# Patient Record
Sex: Female | Born: 1960 | Race: White | Hispanic: No | Marital: Single | Smoking: Former smoker
Health system: Southern US, Community
[De-identification: ages and names within clinical notes are randomized; demographics above are authoritative.]

## PROBLEM LIST (undated history)

## (undated) DIAGNOSIS — C801 Malignant (primary) neoplasm, unspecified: Secondary | ICD-10-CM

## (undated) DIAGNOSIS — F209 Schizophrenia, unspecified: Secondary | ICD-10-CM

## (undated) DIAGNOSIS — I10 Essential (primary) hypertension: Secondary | ICD-10-CM

## (undated) HISTORY — PX: HERNIA REPAIR: SHX51

## (undated) HISTORY — PX: KIDNEY SURGERY: SHX687

---

## 1997-12-08 ENCOUNTER — Encounter: Admission: RE | Admit: 1997-12-08 | Discharge: 1997-12-08 | Payer: Self-pay | Admitting: Family Medicine

## 1997-12-11 ENCOUNTER — Encounter: Admission: RE | Admit: 1997-12-11 | Discharge: 1997-12-11 | Payer: Self-pay | Admitting: Family Medicine

## 1998-01-12 ENCOUNTER — Encounter: Admission: RE | Admit: 1998-01-12 | Discharge: 1998-01-12 | Payer: Self-pay | Admitting: Family Medicine

## 1998-01-28 ENCOUNTER — Encounter: Admission: RE | Admit: 1998-01-28 | Discharge: 1998-01-28 | Payer: Self-pay | Admitting: Family Medicine

## 1998-04-16 ENCOUNTER — Encounter: Admission: RE | Admit: 1998-04-16 | Discharge: 1998-04-16 | Payer: Self-pay | Admitting: Family Medicine

## 1998-07-22 ENCOUNTER — Encounter: Admission: RE | Admit: 1998-07-22 | Discharge: 1998-07-22 | Payer: Self-pay | Admitting: Family Medicine

## 1998-08-11 ENCOUNTER — Encounter: Admission: RE | Admit: 1998-08-11 | Discharge: 1998-08-11 | Payer: Self-pay | Admitting: Family Medicine

## 1998-08-13 ENCOUNTER — Encounter: Admission: RE | Admit: 1998-08-13 | Discharge: 1998-08-13 | Payer: Self-pay | Admitting: Family Medicine

## 1998-11-05 ENCOUNTER — Encounter: Admission: RE | Admit: 1998-11-05 | Discharge: 1998-11-05 | Payer: Self-pay | Admitting: Family Medicine

## 1998-11-19 ENCOUNTER — Encounter: Admission: RE | Admit: 1998-11-19 | Discharge: 1998-11-19 | Payer: Self-pay | Admitting: Family Medicine

## 1998-12-24 ENCOUNTER — Encounter: Admission: RE | Admit: 1998-12-24 | Discharge: 1998-12-24 | Payer: Self-pay | Admitting: Family Medicine

## 1999-07-09 ENCOUNTER — Encounter: Admission: RE | Admit: 1999-07-09 | Discharge: 1999-07-09 | Payer: Self-pay | Admitting: Family Medicine

## 2001-04-25 ENCOUNTER — Encounter: Admission: RE | Admit: 2001-04-25 | Discharge: 2001-04-25 | Payer: Self-pay | Admitting: Family Medicine

## 2002-02-11 ENCOUNTER — Encounter: Admission: RE | Admit: 2002-02-11 | Discharge: 2002-02-11 | Payer: Self-pay | Admitting: Family Medicine

## 2002-02-13 ENCOUNTER — Encounter: Admission: RE | Admit: 2002-02-13 | Discharge: 2002-02-13 | Payer: Self-pay | Admitting: Family Medicine

## 2002-02-18 ENCOUNTER — Encounter: Admission: RE | Admit: 2002-02-18 | Discharge: 2002-02-18 | Payer: Self-pay | Admitting: Family Medicine

## 2003-10-21 ENCOUNTER — Ambulatory Visit (HOSPITAL_COMMUNITY): Admission: RE | Admit: 2003-10-21 | Discharge: 2003-10-21 | Payer: Self-pay | Admitting: Family Medicine

## 2003-10-21 ENCOUNTER — Encounter: Admission: RE | Admit: 2003-10-21 | Discharge: 2003-10-21 | Payer: Self-pay | Admitting: Family Medicine

## 2004-10-22 ENCOUNTER — Emergency Department (HOSPITAL_COMMUNITY): Admission: EM | Admit: 2004-10-22 | Discharge: 2004-10-22 | Payer: Self-pay | Admitting: Emergency Medicine

## 2004-10-25 ENCOUNTER — Ambulatory Visit: Payer: Self-pay | Admitting: Family Medicine

## 2005-01-21 ENCOUNTER — Ambulatory Visit: Payer: Self-pay | Admitting: Family Medicine

## 2005-03-01 ENCOUNTER — Other Ambulatory Visit: Admission: RE | Admit: 2005-03-01 | Discharge: 2005-03-01 | Payer: Self-pay | Admitting: Family Medicine

## 2005-03-04 ENCOUNTER — Encounter: Admission: RE | Admit: 2005-03-04 | Discharge: 2005-03-04 | Payer: Self-pay | Admitting: Family Medicine

## 2006-08-31 DIAGNOSIS — F17200 Nicotine dependence, unspecified, uncomplicated: Secondary | ICD-10-CM

## 2006-08-31 DIAGNOSIS — N951 Menopausal and female climacteric states: Secondary | ICD-10-CM

## 2006-08-31 DIAGNOSIS — I10 Essential (primary) hypertension: Secondary | ICD-10-CM | POA: Insufficient documentation

## 2006-08-31 DIAGNOSIS — E669 Obesity, unspecified: Secondary | ICD-10-CM

## 2006-08-31 DIAGNOSIS — F209 Schizophrenia, unspecified: Secondary | ICD-10-CM | POA: Insufficient documentation

## 2006-08-31 DIAGNOSIS — L732 Hidradenitis suppurativa: Secondary | ICD-10-CM

## 2010-07-25 ENCOUNTER — Encounter: Payer: Self-pay | Admitting: Family Medicine

## 2014-12-24 ENCOUNTER — Other Ambulatory Visit: Payer: Self-pay | Admitting: Family Medicine

## 2014-12-24 DIAGNOSIS — Z1231 Encounter for screening mammogram for malignant neoplasm of breast: Secondary | ICD-10-CM

## 2014-12-31 ENCOUNTER — Ambulatory Visit: Payer: Self-pay

## 2015-07-28 ENCOUNTER — Ambulatory Visit: Payer: Self-pay

## 2015-08-19 ENCOUNTER — Ambulatory Visit: Payer: Self-pay

## 2015-09-07 ENCOUNTER — Ambulatory Visit: Payer: Self-pay

## 2015-10-13 ENCOUNTER — Ambulatory Visit: Payer: Self-pay

## 2015-11-17 ENCOUNTER — Ambulatory Visit
Admission: RE | Admit: 2015-11-17 | Discharge: 2015-11-17 | Disposition: A | Discharge status: Home / Self Care | Payer: Medicare Other | Source: Ambulatory Visit | Attending: Family Medicine | Admitting: Family Medicine

## 2015-11-17 DIAGNOSIS — Z1231 Encounter for screening mammogram for malignant neoplasm of breast: Secondary | ICD-10-CM

## 2015-11-19 ENCOUNTER — Other Ambulatory Visit: Payer: Self-pay | Admitting: Family Medicine

## 2015-11-19 DIAGNOSIS — R928 Other abnormal and inconclusive findings on diagnostic imaging of breast: Secondary | ICD-10-CM

## 2015-12-10 ENCOUNTER — Ambulatory Visit
Admission: RE | Admit: 2015-12-10 | Discharge: 2015-12-10 | Disposition: A | Discharge status: Home / Self Care | Payer: Medicare Other | Source: Ambulatory Visit | Attending: Family Medicine | Admitting: Family Medicine

## 2015-12-10 DIAGNOSIS — R928 Other abnormal and inconclusive findings on diagnostic imaging of breast: Secondary | ICD-10-CM

## 2015-12-23 ENCOUNTER — Other Ambulatory Visit: Payer: Self-pay | Admitting: Family Medicine

## 2015-12-23 DIAGNOSIS — Z136 Encounter for screening for cardiovascular disorders: Secondary | ICD-10-CM

## 2015-12-29 ENCOUNTER — Ambulatory Visit: Payer: Medicare Other

## 2017-10-09 ENCOUNTER — Other Ambulatory Visit: Payer: Self-pay | Admitting: Family Medicine

## 2017-10-09 DIAGNOSIS — F17200 Nicotine dependence, unspecified, uncomplicated: Secondary | ICD-10-CM

## 2017-10-17 ENCOUNTER — Ambulatory Visit: Payer: Medicare Other

## 2017-10-24 ENCOUNTER — Ambulatory Visit
Admission: RE | Admit: 2017-10-24 | Discharge: 2017-10-24 | Disposition: A | Discharge status: Home / Self Care | Payer: Medicare Other | Source: Ambulatory Visit | Attending: Family Medicine | Admitting: Family Medicine

## 2017-10-24 ENCOUNTER — Other Ambulatory Visit: Payer: Self-pay | Admitting: Family Medicine

## 2017-10-24 DIAGNOSIS — R059 Cough, unspecified: Secondary | ICD-10-CM

## 2017-10-24 DIAGNOSIS — R05 Cough: Secondary | ICD-10-CM

## 2017-10-24 DIAGNOSIS — F17200 Nicotine dependence, unspecified, uncomplicated: Secondary | ICD-10-CM

## 2017-10-31 ENCOUNTER — Institutional Professional Consult (permissible substitution) (INDEPENDENT_AMBULATORY_CARE_PROVIDER_SITE_OTHER): Payer: Medicare Other | Admitting: Thoracic Surgery (Cardiothoracic Vascular Surgery)

## 2017-10-31 ENCOUNTER — Other Ambulatory Visit: Payer: Self-pay

## 2017-10-31 ENCOUNTER — Encounter: Payer: Self-pay | Admitting: Thoracic Surgery (Cardiothoracic Vascular Surgery)

## 2017-10-31 ENCOUNTER — Encounter: Payer: Self-pay | Admitting: *Deleted

## 2017-10-31 ENCOUNTER — Other Ambulatory Visit: Payer: Self-pay | Admitting: *Deleted

## 2017-10-31 VITALS — BP 100/69 | HR 110 | Resp 18 | Ht 61.0 in | Wt 180.0 lb

## 2017-10-31 DIAGNOSIS — R918 Other nonspecific abnormal finding of lung field: Secondary | ICD-10-CM | POA: Diagnosis not present

## 2017-10-31 DIAGNOSIS — R911 Solitary pulmonary nodule: Secondary | ICD-10-CM

## 2017-10-31 NOTE — Progress Notes (Signed)
PCP is [facility], User, MD (Inactive) Referring Provider is Nickola Major, MD  Chief Complaint  Patient presents with  . Lung Mass     INFRAHILAR per CANCER SCREENING CT CHEST 10/24/17...new onset of right sided headache x 11/2 months  . Adenopathy    HPI: Jill [name] is sent for consultation regarding a right lung mass.  Jill [name] is a 57 year old woman with a history of schizophrenia, obesity, hypertension, hyperlipidemia, hidradenitis, and tobacco abuse.  She has smoked about 2 packs of cigarettes daily since age 30 (72-pack-year).  She has been feeling poorly since September.  She first started having a lot of congestion and frequent coughing back around Labor Day.  She was treated several times for bronchitis with antibiotics.  Each time she would see some improvement but after completing her course her symptoms will return relatively quickly.  This is become progressively worse to the point where she developed a chronic persistent cough.  She had a small amount of hemoptysis on one occasion.  She also was feeling some shortness of breath.  Along with this she was having fevers and intermittent chills and intermittent night sweats.  She saw Dr.Eksir in [place].  She did a chest x-ray which suggested a right lower lobe pneumonia.  I was followed up with a CT of the chest which showed a 3.8 x 5.4 cm mass in the right lower lobe.  There was a second 1.7 cm nodule in the right middle lobe.  There also was evidence of centrilobular emphysema.  She complains of headaches over the past month and a half.  These are typically right-sided.  She continues to have a cough.  She had one episode of hemoptysis.  She does complain of wheezing.  She has not been having any chest pressure, pain or tightness.  But she does experience shortness of breath with exertion.  She complains of her voice being hoarse for several months.  PAST MEDICAL  HISTORY Schizophrenia Hypertension Hyperlipidemia Obesity Pneumonia Allergies  PAST SURGICAL HISTORY Eye surgery Hiatal hernia repair  FAMILY HISTORY Mother died of cancer 20 years old Brother has COPD  Social History Social History   Tobacco Use  . Smoking status: Current Every Day Smoker    Packs/day: 2.00    Years: 36.00    Pack years: 72.00    Types: Cigarettes    Start date: 10/31/1981  . Smokeless tobacco: Never Used  . Tobacco comment: HAS CALLED QUIT 4 FREE ALREADY  Substance Use Topics  . Alcohol use: Never    Frequency: Never  . Drug use: Never    Current Outpatient Medications  Medication Sig Dispense Refill  . aspirin EC 81 MG tablet Take 81 mg by mouth daily.    Marland Kitchen aspirin-acetaminophen-caffeine (EXCEDRIN MIGRAINE) 250-250-65 MG tablet Take 2 tablets by mouth every 6 (six) hours as needed for headache.    . haloperidol decanoate (HALDOL DECANOATE) 100 MG/ML injection Inject into the muscle every 28 (twenty-eight) days. [place] by Jill [name] [phone]    . HYDROcodone-acetaminophen (HYCET) 7.5-325 mg/15 ml solution Take 5 mLs by mouth at bedtime.    Marland Kitchen lisinopril-hydrochlorothiazide (PRINZIDE,ZESTORETIC) 20-25 MG tablet Take 1 tablet by mouth daily.    . rosuvastatin (CRESTOR) 20 MG tablet Take 20 mg by mouth daily at 6 PM.     No current facility-administered medications for this visit.     No Known Allergies  Review of Systems  Constitutional: Positive for appetite change, diaphoresis, fatigue, fever  and unexpected weight change (Lost 7 pounds in 3 months).  HENT: Positive for sinus pressure and voice change (Hoarse for several months).   Eyes: Negative for visual disturbance.  Respiratory: Positive for cough, shortness of breath and wheezing.   Gastrointestinal: Negative for abdominal distention and abdominal pain.       Hiatal hernia surgery  Genitourinary: Negative for difficulty urinating and dyspareunia.   Musculoskeletal: Negative for arthralgias and myalgias.  Neurological: Positive for headaches (Right-sided). Negative for seizures and syncope.  Hematological: Negative for adenopathy. Does not bruise/bleed easily.  Psychiatric/Behavioral:       Schizophrenia-Haldol injection monthly  All other systems reviewed and are negative.   BP 100/69 (BP Location: Right Arm, Patient Position: Sitting, Cuff Size: Large)   Pulse (!) 110   Resp 18   Ht 5\' 1"  (1.549 m)   Wt 180 lb (81.6 kg)   SpO2 94% Comment: ON RA  BMI 34.01 kg/m  Physical Exam  Constitutional: She is oriented to person, place, and time.  Obese  HENT:  Head: Normocephalic and atraumatic.  Mouth/Throat: No oropharyngeal exudate.  Eyes: Conjunctivae and EOM are normal. No scleral icterus.  Neck: No thyromegaly present.  Cardiovascular: Normal rate, regular rhythm and normal heart sounds. Exam reveals no gallop and no friction rub.  No murmur heard. Pulmonary/Chest: Effort normal and breath sounds normal.  Abdominal: Soft. She exhibits no distension. There is no tenderness.  Musculoskeletal: She exhibits no edema.  Lymphadenopathy:    She has no cervical adenopathy.  Neurological: She is alert and oriented to person, place, and time. No cranial nerve deficit.  No focal motor deficit  Skin: Skin is warm and dry.  No clubbing cyanosis or edema  Psychiatric: She has a normal mood and affect.  Vitals reviewed.  Diagnostic Tests: CT CHEST WITHOUT CONTRAST LOW-DOSE FOR LUNG CANCER SCREENING  TECHNIQUE: Multidetector CT imaging of the chest was performed following the standard protocol without IV contrast.  COMPARISON:  None.  FINDINGS: Cardiovascular: The heart size is normal. No pericardial effusion. Coronary artery calcification is evident. Atherosclerotic calcification is noted in the wall of the thoracic aorta. Ascending thoracic aorta  Mediastinum/Nodes: Low right paratracheal lymph node measures 12  mm short axis. 12 mm short axis subcarinal lymph node evident. 6 mm short axis lymph node identified in the right thoracic inlet (image 10/series 2). There is no axillary lymphadenopathy. The esophagus has normal imaging features.  Lungs/Pleura: 3.8 x 5.4 cm lesion identified in the right infrahilar region, generating substantial mass-effect on the right middle lobe bronchus and obliterating central airway to the right lower lobe.  1.1 x 1.7 cm central right middle lobe pulmonary nodule is identified on image 157 of series 3. There is confluent airspace opacity in the posterior right lung which may be neoplastic or postobstructive in etiology.  Centrilobular emphysema evident. Atelectasis/scarring noted in the right middle lobe and lingula. Calcified granuloma identified in the left lower lobe near the major fissure.  Upper Abdomen: 12 mm left adrenal nodule has an average attenuation of 1 Hounsfield units suggesting adenoma. Right adrenal gland unremarkable. Nonobstructing renal stones are evident bilaterally.  Musculoskeletal: Bone windows reveal no worrisome lytic or sclerotic osseous lesions.  IMPRESSION: 1. 5.4 cm right infrahilar mass obliterates right lower lobe bronchus and attenuates the right middle lobe airway. This is associated with mediastinal lymphadenopathy and a 1.7 cm nodule in the central right middle lobe concerning for metastatic involvement. Confluent airspace opacity in the posterior right lower lobe may  be neoplastic and/or postobstructive. Lung-RADS 4B, highly suspicious for primary neoplasm with metastatic disease. Additional imaging evaluation or consultation with Pulmonology or Thoracic Surgery recommended.  These results will be called to the ordering clinician or representative by the Radiologist Assistant, and communication documented in the PACS or zVision Dashboard.   Electronically Signed   By: Misty Stanley M.D.   On: 10/24/2017  16:48  Impression: Jill [name] is a 57 year old woman with a history of schizophrenia and tobacco abuse.  She has been having problems with cough and congestion dating back to September 2018.  More recently her symptoms worsened and she was evaluated.  Chest x-ray showed possible pneumonia but on CT it appears more consistent with a 5.4 cm right lower lobe mass.  There is not any significant hilar or mediastinal adenopathy by CT.  I reviewed the CT images with Jill [name] and her brother.  We discussed the differential diagnosis.  This most likely is a primary bronchogenic carcinoma.  He would be a T4, N0, stage IIIA lesion based on CT.  Granulomatous disease is also in the differential.  She needs a PET/CT and MRI of the brain to complete her diagnostic work-up.  She needs to go ahead and have the MRI because she is having headaches.  PET/CT will help [place] determine how to approach her for biopsy.  I suspect bronchoscopy and endobronchial ultrasound will be the best option but we need to make sure there is no evidence of distant disease that would be amenable to a needle biopsy.  Tobacco abuse-still smoking about 4 cigarettes a day.  I emphasized the importance of complete abstinence.  She does have patches.  She is going to try lozenges as well.  Schizophrenia-appears well controlled on monthly Haldol injections.  COPD-findings consistent with centrilobular emphysema on CT chest.  Had a prescription for albuterol inhaler, but discontinued as it aggravated her cough.  Plan: PET/CT MR brain Return in 2 weeks after scans to discuss potential biopsy  Melrose Nakayama, MD [facility] [phone]

## 2017-10-31 NOTE — Progress Notes (Unsigned)
pet

## 2017-11-01 ENCOUNTER — Encounter: Payer: Medicare Other | Admitting: Thoracic Surgery (Cardiothoracic Vascular Surgery)

## 2017-11-08 ENCOUNTER — Ambulatory Visit (HOSPITAL_COMMUNITY)
Admission: RE | Admit: 2017-11-08 | Discharge: 2017-11-08 | Disposition: A | Discharge status: Home / Self Care | Payer: Medicare Other | Source: Ambulatory Visit | Attending: Thoracic Surgery (Cardiothoracic Vascular Surgery) | Admitting: Thoracic Surgery (Cardiothoracic Vascular Surgery)

## 2017-11-08 ENCOUNTER — Encounter (HOSPITAL_COMMUNITY)
Admission: RE | Admit: 2017-11-08 | Discharge: 2017-11-08 | Disposition: A | Discharge status: Home / Self Care | Payer: Medicare Other | Source: Ambulatory Visit | Attending: Thoracic Surgery (Cardiothoracic Vascular Surgery) | Admitting: Thoracic Surgery (Cardiothoracic Vascular Surgery)

## 2017-11-08 DIAGNOSIS — I700 Atherosclerosis of aorta: Secondary | ICD-10-CM | POA: Insufficient documentation

## 2017-11-08 DIAGNOSIS — J189 Pneumonia, unspecified organism: Secondary | ICD-10-CM | POA: Diagnosis not present

## 2017-11-08 DIAGNOSIS — R911 Solitary pulmonary nodule: Secondary | ICD-10-CM

## 2017-11-08 DIAGNOSIS — C7931 Secondary malignant neoplasm of brain: Secondary | ICD-10-CM | POA: Insufficient documentation

## 2017-11-08 DIAGNOSIS — N200 Calculus of kidney: Secondary | ICD-10-CM | POA: Insufficient documentation

## 2017-11-08 LAB — POCT I-STAT CREATININE: CREATININE: 0.8 mg/dL (ref 0.44–1.00)

## 2017-11-08 LAB — GLUCOSE, CAPILLARY: Glucose-Capillary: 97 mg/dL (ref 65–99)

## 2017-11-08 MED ORDER — FLUDEOXYGLUCOSE F - 18 (FDG) INJECTION
9.0000 | Freq: Once | INTRAVENOUS | Status: AC | PRN
  Administered 2017-11-08: 9 via INTRAVENOUS

## 2017-11-08 MED ORDER — GADOBENATE DIMEGLUMINE 529 MG/ML IV SOLN
20.0000 mL | Freq: Once | INTRAVENOUS | Status: AC | PRN
  Administered 2017-11-08: 17 mL via INTRAVENOUS

## 2017-11-09 ENCOUNTER — Telehealth: Payer: Self-pay | Admitting: *Deleted

## 2017-11-09 ENCOUNTER — Encounter: Payer: Self-pay | Admitting: *Deleted

## 2017-11-09 DIAGNOSIS — R918 Other nonspecific abnormal finding of lung field: Secondary | ICD-10-CM

## 2017-11-09 NOTE — Progress Notes (Signed)
Oncology Nurse Navigator Documentation  Oncology Nurse Navigator Flowsheets 11/09/2017  Navigator Location [facility]  Referral date to RadOnc/MedOnc 11/09/2017  Navigator Encounter Type Other/I received a call from Dr. Leonarda Salon office.  Patient needs urgent referral to Rad Onc. Referral completed in Epic.  I updated Dr. Julien Nordmann on Jill [name].  He states she needs bronchoscopy for tissue DX then will see her.  I will update JoLene at [facility].   Patient Visit Type Other  Treatment Phase Abnormal Scans  Barriers/Navigation Needs Coordination of Care  Interventions Coordination of Care  Coordination of Care Other  Acuity Level 2  Time Spent with Patient 30

## 2017-11-09 NOTE — Telephone Encounter (Signed)
Jill [name] called today wanting the results of the MRI BRAIN and PET that she had done on 11/08/17 as ordered by Dr. Roxan Hockey for evaluation of a lung mass. She said she is very anxious and would like to have the results today even thougt her follow up  appointment isn't until next week. I reviewed the results with her which showed a lesion in her brain and bone lesions. She said these would account for her headaches and body pains she has been experiencing. I said I would call the cancer center, specifically, [facility], navigator and start the process for her consults/treatments. She was most appreciative. This was done and [name] said she would consult with Dr. Inda Merlin to see if the lung mass was amenable for a needle biopsy. I thanked her for the assistance. Dr. Roxan Hockey was also informed via staff message as he is on vac today.

## 2017-11-09 NOTE — Telephone Encounter (Signed)
[name] called to relate that Dr. Inda Merlin requests that a [facility] be performed on Jill [name], 1961-05-06, for tissue diagnosis. Dr. Roxan Hockey will be notified.

## 2017-11-10 ENCOUNTER — Encounter: Payer: Self-pay | Admitting: Radiation Oncology

## 2017-11-12 ENCOUNTER — Inpatient Hospital Stay (HOSPITAL_COMMUNITY)
Admission: EM | Admit: 2017-11-12 | Discharge: 2017-11-18 | DRG: 853 | Disposition: A | Discharge status: Home Health | Payer: Medicare Other | Attending: Internal Medicine | Admitting: Internal Medicine

## 2017-11-12 ENCOUNTER — Other Ambulatory Visit: Payer: Self-pay

## 2017-11-12 ENCOUNTER — Emergency Department (HOSPITAL_COMMUNITY): Payer: Medicare Other

## 2017-11-12 ENCOUNTER — Encounter (HOSPITAL_COMMUNITY): Payer: Self-pay | Admitting: Emergency Medicine

## 2017-11-12 DIAGNOSIS — Z6833 Body mass index (BMI) 33.0-33.9, adult: Secondary | ICD-10-CM

## 2017-11-12 DIAGNOSIS — G893 Neoplasm related pain (acute) (chronic): Secondary | ICD-10-CM | POA: Diagnosis present

## 2017-11-12 DIAGNOSIS — C3431 Malignant neoplasm of lower lobe, right bronchus or lung: Secondary | ICD-10-CM | POA: Diagnosis present

## 2017-11-12 DIAGNOSIS — R0602 Shortness of breath: Secondary | ICD-10-CM

## 2017-11-12 DIAGNOSIS — F17200 Nicotine dependence, unspecified, uncomplicated: Secondary | ICD-10-CM | POA: Diagnosis present

## 2017-11-12 DIAGNOSIS — Z79891 Long term (current) use of opiate analgesic: Secondary | ICD-10-CM

## 2017-11-12 DIAGNOSIS — J189 Pneumonia, unspecified organism: Secondary | ICD-10-CM

## 2017-11-12 DIAGNOSIS — F209 Schizophrenia, unspecified: Secondary | ICD-10-CM | POA: Diagnosis present

## 2017-11-12 DIAGNOSIS — C7951 Secondary malignant neoplasm of bone: Secondary | ICD-10-CM | POA: Diagnosis present

## 2017-11-12 DIAGNOSIS — A419 Sepsis, unspecified organism: Secondary | ICD-10-CM | POA: Diagnosis not present

## 2017-11-12 DIAGNOSIS — R5381 Other malaise: Secondary | ICD-10-CM | POA: Diagnosis present

## 2017-11-12 DIAGNOSIS — C7931 Secondary malignant neoplasm of brain: Secondary | ICD-10-CM

## 2017-11-12 DIAGNOSIS — F17210 Nicotine dependence, cigarettes, uncomplicated: Secondary | ICD-10-CM | POA: Diagnosis present

## 2017-11-12 DIAGNOSIS — C3491 Malignant neoplasm of unspecified part of right bronchus or lung: Secondary | ICD-10-CM | POA: Diagnosis present

## 2017-11-12 DIAGNOSIS — G43709 Chronic migraine without aura, not intractable, without status migrainosus: Secondary | ICD-10-CM | POA: Diagnosis present

## 2017-11-12 DIAGNOSIS — I10 Essential (primary) hypertension: Secondary | ICD-10-CM | POA: Diagnosis present

## 2017-11-12 DIAGNOSIS — R042 Hemoptysis: Secondary | ICD-10-CM | POA: Diagnosis present

## 2017-11-12 DIAGNOSIS — E669 Obesity, unspecified: Secondary | ICD-10-CM | POA: Diagnosis present

## 2017-11-12 DIAGNOSIS — Z8701 Personal history of pneumonia (recurrent): Secondary | ICD-10-CM

## 2017-11-12 DIAGNOSIS — J181 Lobar pneumonia, unspecified organism: Secondary | ICD-10-CM | POA: Diagnosis present

## 2017-11-12 DIAGNOSIS — Z79899 Other long term (current) drug therapy: Secondary | ICD-10-CM

## 2017-11-12 DIAGNOSIS — G936 Cerebral edema: Secondary | ICD-10-CM | POA: Diagnosis present

## 2017-11-12 DIAGNOSIS — R0902 Hypoxemia: Secondary | ICD-10-CM | POA: Diagnosis present

## 2017-11-12 DIAGNOSIS — Y95 Nosocomial condition: Secondary | ICD-10-CM | POA: Diagnosis present

## 2017-11-12 HISTORY — DX: Schizophrenia, unspecified: F20.9

## 2017-11-12 HISTORY — DX: Malignant (primary) neoplasm, unspecified: C80.1

## 2017-11-12 HISTORY — DX: Essential (primary) hypertension: I10

## 2017-11-12 LAB — COMPREHENSIVE METABOLIC PANEL
ALK PHOS: 74 U/L (ref 38–126)
ALT: 18 U/L (ref 14–54)
AST: 20 U/L (ref 15–41)
Albumin: 4.2 g/dL (ref 3.5–5.0)
Anion gap: 13 (ref 5–15)
BILIRUBIN TOTAL: 0.8 mg/dL (ref 0.3–1.2)
BUN: 21 mg/dL — AB (ref 6–20)
CO2: 22 mmol/L (ref 22–32)
CREATININE: 0.74 mg/dL (ref 0.44–1.00)
Calcium: 10.1 mg/dL (ref 8.9–10.3)
Chloride: 104 mmol/L (ref 101–111)
GFR calc Af Amer: >60 mL/min (ref >60)
GFR calc non Af Amer: >60 mL/min (ref >60)
Glucose, Bld: 97 mg/dL (ref 65–99)
Potassium: 4 mmol/L (ref 3.5–5.1)
Sodium: 139 mmol/L (ref 135–145)
TOTAL PROTEIN: 7.4 g/dL (ref 6.5–8.1)

## 2017-11-12 LAB — CBC
HCT: 43.1 % (ref 36.0–46.0)
Hemoglobin: 13.9 g/dL (ref 12.0–15.0)
MCH: 28.5 pg (ref 26.0–34.0)
MCHC: 32.3 g/dL (ref 30.0–36.0)
MCV: 88.3 fL (ref 78.0–100.0)
PLATELETS: 240 10*3/uL (ref 150–400)
RBC: 4.88 MIL/uL (ref 3.87–5.11)
RDW: 14.4 % (ref 11.5–15.5)
WBC: 12.4 10*3/uL — AB (ref 4.0–10.5)

## 2017-11-12 LAB — TYPE AND SCREEN
ABO/RH(D): A NEG
ANTIBODY SCREEN: NEGATIVE

## 2017-11-12 LAB — LIPASE, BLOOD: LIPASE: 45 U/L (ref 11–51)

## 2017-11-12 LAB — I-STAT TROPONIN, ED: Troponin i, poc: 0 ng/mL (ref 0.00–0.08)

## 2017-11-12 LAB — PROTIME-INR
INR: 0.98
Prothrombin Time: 12.9 seconds (ref 11.4–15.2)

## 2017-11-12 LAB — I-STAT CG4 LACTIC ACID, ED: Lactic Acid, Venous: 0.93 mmol/L (ref 0.5–1.9)

## 2017-11-12 MED ORDER — SODIUM CHLORIDE 0.9 % IV SOLN
1.0000 g | Freq: Once | INTRAVENOUS | Status: AC
  Administered 2017-11-13: 1 g via INTRAVENOUS
  Filled 2017-11-12: qty 1

## 2017-11-12 MED ORDER — IOPAMIDOL (ISOVUE-370) INJECTION 76%
INTRAVENOUS | Status: AC
  Filled 2017-11-12: qty 100

## 2017-11-12 MED ORDER — PROCHLORPERAZINE EDISYLATE 10 MG/2ML IJ SOLN
10.0000 mg | Freq: Once | INTRAMUSCULAR | Status: AC
  Administered 2017-11-12: 10 mg via INTRAVENOUS
  Filled 2017-11-12: qty 2

## 2017-11-12 MED ORDER — IOPAMIDOL (ISOVUE-370) INJECTION 76%
100.0000 mL | Freq: Once | INTRAVENOUS | Status: AC | PRN
  Administered 2017-11-12: 100 mL via INTRAVENOUS

## 2017-11-12 MED ORDER — MORPHINE SULFATE (PF) 4 MG/ML IV SOLN
4.0000 mg | Freq: Once | INTRAVENOUS | Status: AC
  Administered 2017-11-12: 4 mg via INTRAVENOUS
  Filled 2017-11-12: qty 1

## 2017-11-12 MED ORDER — NICOTINE 14 MG/24HR TD PT24
14.0000 mg | MEDICATED_PATCH | Freq: Once | TRANSDERMAL | Status: AC
  Administered 2017-11-12: 14 mg via TRANSDERMAL
  Filled 2017-11-12: qty 1

## 2017-11-12 NOTE — ED Notes (Signed)
Bed: BJ47 Expected date:  Expected time:  Means of arrival:  Comments: Triage 3

## 2017-11-12 NOTE — ED Provider Notes (Signed)
[place] DEPT Provider Note   CSN: [id number] Arrival date & time: 11/12/17  2000     History   Chief Complaint Chief Complaint  Patient presents with  . Hemoptysis    HPI Jill [name] is a 57 y.o. female.  The history is provided by the patient, medical records and a relative.  Cough  This is a new problem. The current episode started 6 to 12 hours ago. The problem occurs constantly. The problem has not changed since onset.The cough is productive of bloody sputum. There has been no fever. Associated symptoms include chest pain, headaches and shortness of breath. Pertinent negatives include no chills and no wheezing. She has tried nothing for the symptoms. The treatment provided no relief. She is a smoker. Her past medical history is significant for pneumonia (recent). Past medical history comments: lung CA.    Past Medical History:  Diagnosis Date  . Cancer ([place])   . Hypertension   . Schizophrenia [facility])     Patient Active Problem List   Diagnosis Date Noted  . OBESITY, NOS 08/31/2006  . SCHIZOPHRENIA 08/31/2006  . TOBACCO DEPENDENCE 08/31/2006  . HYPERTENSION, BENIGN SYSTEMIC 08/31/2006  . MENOPAUSAL SYNDROME 08/31/2006  . HYDRADENITIS 08/31/2006    Past Surgical History:  Procedure Laterality Date  . HERNIA REPAIR    . KIDNEY SURGERY       OB History   None      Home Medications    Prior to Admission medications   Medication Sig Start Date End Date Taking? Authorizing Provider  aspirin EC 81 MG tablet Take 81 mg by mouth daily.    [provider]  aspirin-acetaminophen-caffeine (EXCEDRIN MIGRAINE) [phone] MG tablet Take 2 tablets by mouth every 6 (six) hours as needed for headache.    [provider]  haloperidol decanoate (HALDOL DECANOATE) 100 MG/ML injection Inject into the muscle every 28 (twenty-eight) days. PER [place] by Filomena Jungling [phone]    [provider]    HYDROcodone-acetaminophen (HYCET) 7.5-325 mg/15 ml solution Take 5 mLs by mouth at bedtime.    [provider]  lisinopril-hydrochlorothiazide (PRINZIDE,ZESTORETIC) 20-25 MG tablet Take 1 tablet by mouth daily.    [provider]  rosuvastatin (CRESTOR) 20 MG tablet Take 20 mg by mouth daily at 6 PM.    [provider]    Family History History reviewed. No pertinent family history.  Social History Social History   Tobacco Use  . Smoking status: Current Every Day Smoker    Packs/day: 2.00    Years: 36.00    Pack years: 72.00    Types: Cigarettes    Start date: 10/31/1981  . Smokeless tobacco: Never Used  . Tobacco comment: HAS CALLED QUIT 4 FREE ALREADY  Substance Use Topics  . Alcohol use: Never    Frequency: Never  . Drug use: Never     Allergies   Patient has no known allergies.   Review of Systems Review of Systems  Constitutional: Positive for fatigue. Negative for chills, diaphoresis and fever.  HENT: Negative for congestion.   Eyes: Negative for visual disturbance.  Respiratory: Positive for cough, chest tightness and shortness of breath. Negative for wheezing.   Cardiovascular: Positive for chest pain. Negative for palpitations and leg swelling.  Gastrointestinal: Positive for nausea. Negative for abdominal pain, diarrhea and vomiting.  Genitourinary: Negative for flank pain.  Musculoskeletal: Negative for back pain and neck pain.  Skin: Negative for rash and wound.  Neurological: Positive for headaches. Negative for light-headedness.  Psychiatric/Behavioral: Negative for agitation.  All other systems reviewed and are negative.    Physical Exam Updated Vital Signs BP 132/83 (BP Location: Left Arm)   Pulse (!) 119   Temp 98.2 F (36.8 C) (Oral)   Resp 18   Ht 5\' 1"  (1.549 m)   Wt 81.6 kg (180 lb)   SpO2 90%   BMI 34.01 kg/m   Physical Exam  Constitutional: She is oriented to person, place, and time. She appears  well-developed and well-nourished. No distress.  HENT:  Head: Normocephalic and atraumatic.  Mouth/Throat: Oropharynx is clear and moist. No oropharyngeal exudate.  Eyes: Pupils are equal, round, and reactive to light. Conjunctivae and EOM are normal.  Neck: Normal range of motion. Neck supple.  Cardiovascular: Intact distal pulses. Tachycardia present.  No murmur heard. Pulmonary/Chest: No stridor. Tachypnea noted. No respiratory distress. She has no wheezes. She has rhonchi. She exhibits no tenderness.  Abdominal: Soft. She exhibits no distension. There is no tenderness.  Musculoskeletal: Normal range of motion. She exhibits no edema or tenderness.  Lymphadenopathy:    She has no cervical adenopathy.  Neurological: She is alert and oriented to person, place, and time. No sensory deficit. She exhibits normal muscle tone.  Skin: Capillary refill takes less than 2 seconds. No rash noted. She is not diaphoretic. No erythema.  Psychiatric: She has a normal mood and affect.  Nursing note and vitals reviewed.    ED Treatments / Results  Labs (all labs ordered are listed, but only abnormal results are displayed) Labs Reviewed  COMPREHENSIVE METABOLIC PANEL - Abnormal; Notable for the following components:      Result Value   BUN 21 (*)    All other components within normal limits  CBC - Abnormal; Notable for the following components:   WBC 12.4 (*)    All other components within normal limits  CULTURE, BLOOD (ROUTINE X 2)  CULTURE, BLOOD (ROUTINE X 2)  PROTIME-INR  LIPASE, BLOOD  I-STAT TROPONIN, ED  I-STAT CG4 LACTIC ACID, ED  I-STAT CG4 LACTIC ACID, ED  TYPE AND SCREEN  ABO/RH    EKG EKG Interpretation  Date/Time:  Monday Nov 13 2017 00:03:17 EDT Ventricular Rate:  105 PR Interval:    QRS Duration: 75 QT Interval:  318 QTC Calculation: 421 R Axis:   83 Text Interpretation:  Sinus tachycardia Atrial premature complex Low voltage, extremity and precordial leads When  compared to prior, no significant changes seen.  NO STEMI Confirmed by Antony Blackbird [phone]) on 11/13/2017 12:06:43 AM   Radiology Ct Angio Chest Pe W And/or Wo Contrast  Result Date: 11/12/2017 CLINICAL DATA:  Hemoptysis lung tumor EXAM: CT ANGIOGRAPHY CHEST WITH CONTRAST TECHNIQUE: Multidetector CT imaging of the chest was performed using the standard protocol during bolus administration of intravenous contrast. Multiplanar CT image reconstructions and MIPs were obtained to evaluate the vascular anatomy. CONTRAST:  100 cc Isovue 370 intravenous COMPARISON:  Chest x-ray 10/24/2017, CT chest 10/24/2017, PET-CT 11/08/2017 FINDINGS: Cardiovascular: Significant respiratory motion limits evaluation for pulmonary emboli, mostly within the lower lobes. No acute central embolus is seen. Nonaneurysmal aorta. No dissection. Moderate aortic atherosclerosis. Coronary vascular calcification. Normal heart size. No pericardial effusion Mediastinum/Nodes: Midline trachea. Coarse calcification right lobe of thyroid. Enlarged mediastinal lymph nodes. Right paratracheal lymph node measures 11 mm. Enlarged right hilar lymph nodes measuring 14 mm. Esophagus within normal limits. Lungs/Pleura: Right infrahilar lung mass measuring approximately 3.5 by 3.1 cm. Right middle  lobe lung nodule measuring 18 mm. Mild emphysema. Right lung base mass now confluent with adjacent consolidation making it difficult to measure. Upper Abdomen: No acute abnormality. Stone in the mid pole of left kidney. Musculoskeletal: Left second rib lesion on PET-CT not well demonstrated. No additional suspicious bony findings Review of the MIP images confirms the above findings. IMPRESSION: 1. Limited study secondary to respiratory motion artifact particularly at the lung bases. No definite acute central PE is seen 2. Similar size and appearance of right infrahilar lung mass. Additional mass in the right lower lobe has now become confluent with consolidation  in the right lower lobe, making it difficult to measure. Enlarged mediastinal and right hilar lymph nodes as before. 3. Stable right middle lobe 18 mm lung nodule, this is noted to be PET negative 4. Stone in the left kidney Aortic Atherosclerosis (ICD10-I70.0) and Emphysema (ICD10-J43.9). Electronically Signed   By: Donavan Foil M.D.   On: 11/12/2017 23:46    Procedures Procedures (including critical care time)  CRITICAL CARE Performed by: Gwenyth Allegra Savera Donson Total critical care time: 35 minutes Critical care time was exclusive of separately billable procedures and treating other patients. Critical care was necessary to treat or prevent imminent or life-threatening deterioration. Critical care was time spent personally by me on the following activities: development of treatment plan with patient and/or surrogate as well as nursing, discussions with consultants, evaluation of patient's response to treatment, examination of patient, obtaining history from patient or surrogate, ordering and performing treatments and interventions, ordering and review of laboratory studies, ordering and review of radiographic studies, pulse oximetry and re-evaluation of patient's condition.   Medications Ordered in ED Medications  nicotine (NICODERM CQ - dosed in mg/24 hours) patch 14 mg (14 mg Transdermal Patch Applied 11/12/17 2152)  ceFEPIme (MAXIPIME) 1 g in sodium chloride 0.9 % 100 mL IVPB (1 g Intravenous New Bag/Given 11/13/17 0018)  vancomycin (VANCOCIN) 1,500 mg in sodium chloride 0.9 % 500 mL IVPB (has no administration in time range)  morphine 4 MG/ML injection 4 mg (4 mg Intravenous Given 11/12/17 2149)  prochlorperazine (COMPAZINE) injection 10 mg (10 mg Intravenous Given 11/12/17 2148)  iopamidol (ISOVUE-370) 76 % injection 100 mL (100 mLs Intravenous Contrast Given 11/12/17 2241)     Initial Impression / Assessment and Plan / ED Course  I have reviewed the triage vital signs and the nursing  notes.  Pertinent labs & imaging results that were available during my care of the patient were reviewed by me and considered in my medical decision making (see chart for details).     Jill [name] is a 57 y.o. 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  Patient reports that she recently had a pneumonia when she was diagnosed with the lung cancer.  She reports she is continued to have a mild cough but today started having hemoptysis at noon.  She reports she is coughed up a large amount of blood.  She reports feeling very short of breath and having some left-sided chest aching.  She reports that the chest pain is new.  She says it is worse with coughing and deep breathing.  She denies any history of DVT or PE and denies any new leg pain or leg swelling.  She denies any abdominal pain or vomiting but does report mild nausea.  She reports  having right-sided headaches that come and go over the last few weeks.  She denies any vision changes at this time.  She denies any recent trauma.  She describes her chest pain is a 4 out of 10 in severity.  She does report having some sweats and chills.  On exam, lungs had coarse breath sounds bilaterally in the bases.  Chest was nontender.  Abdomen was nontender.  Normal pulses in all extremities.  No significant edema seen in legs.  No focal neurologic deficit seen.  Clinically I am concerned patient may have either recurrent pneumonia causing her hemoptysis, hemoptysis from her cancer, or a pulmonary embolism with her hyper coag ability and arrival with tachycardia tachypnea, and hypoxia.  Patient was feeling better on 2 L nasal cannula and option saturations were now in the mid 90s.  Patient will have laboratory testing and CT imaging to further evaluate.  Dissipate admission given  patient's hemoptysis and symptoms.  11:52 PM CT scan showed persistent lung mass as well as worsening consolidation.  In the clinical context of shortness of breath, hemoptysis, tachycardia, and recent pneumonia, I am concerned about worsening pneumonia.  Patient will be given broad-spectrum antibiotics and will be admitted.  No evidence of PE on imaging as per radiology.  Patient's hemoglobin does not appear to be anemic.   Hospitalist team called for admission.   Final Clinical Impressions(s) / ED Diagnoses   Final diagnoses:  Hemoptysis  Hypoxia  Shortness of breath  Community acquired pneumonia, unspecified laterality    ED Discharge Orders    None      Clinical Impression: 1. Hemoptysis   2. Hypoxia   3. Shortness of breath   4. Community acquired pneumonia, unspecified laterality     Disposition: Admit  This note was prepared with assistance of Systems analyst. Occasional wrong-word or sound-a-like substitutions may have occurred due to the inherent limitations of voice recognition software.      Harun Brumley, Gwenyth Allegra, MD 11/13/17 [phone]

## 2017-11-12 NOTE — ED Triage Notes (Signed)
Pt reports coughing up blood that started around lunch time today and was recently diagnosed with tumors on lung with metastasis to brain.

## 2017-11-12 NOTE — ED Notes (Signed)
Pt given iced water.

## 2017-11-13 ENCOUNTER — Other Ambulatory Visit: Payer: Self-pay

## 2017-11-13 DIAGNOSIS — C3491 Malignant neoplasm of unspecified part of right bronchus or lung: Secondary | ICD-10-CM | POA: Diagnosis not present

## 2017-11-13 DIAGNOSIS — R042 Hemoptysis: Secondary | ICD-10-CM | POA: Diagnosis present

## 2017-11-13 DIAGNOSIS — A419 Sepsis, unspecified organism: Secondary | ICD-10-CM | POA: Diagnosis present

## 2017-11-13 DIAGNOSIS — F17210 Nicotine dependence, cigarettes, uncomplicated: Secondary | ICD-10-CM | POA: Diagnosis present

## 2017-11-13 DIAGNOSIS — F209 Schizophrenia, unspecified: Secondary | ICD-10-CM | POA: Diagnosis present

## 2017-11-13 DIAGNOSIS — J189 Pneumonia, unspecified organism: Secondary | ICD-10-CM | POA: Diagnosis not present

## 2017-11-13 DIAGNOSIS — R51 Headache: Secondary | ICD-10-CM | POA: Diagnosis not present

## 2017-11-13 DIAGNOSIS — C3431 Malignant neoplasm of lower lobe, right bronchus or lung: Secondary | ICD-10-CM | POA: Diagnosis present

## 2017-11-13 DIAGNOSIS — F17200 Nicotine dependence, unspecified, uncomplicated: Secondary | ICD-10-CM

## 2017-11-13 DIAGNOSIS — Z79891 Long term (current) use of opiate analgesic: Secondary | ICD-10-CM | POA: Diagnosis not present

## 2017-11-13 DIAGNOSIS — E669 Obesity, unspecified: Secondary | ICD-10-CM | POA: Diagnosis present

## 2017-11-13 DIAGNOSIS — C7951 Secondary malignant neoplasm of bone: Secondary | ICD-10-CM | POA: Diagnosis present

## 2017-11-13 DIAGNOSIS — C7931 Secondary malignant neoplasm of brain: Secondary | ICD-10-CM | POA: Diagnosis present

## 2017-11-13 DIAGNOSIS — Z8701 Personal history of pneumonia (recurrent): Secondary | ICD-10-CM | POA: Diagnosis not present

## 2017-11-13 DIAGNOSIS — G936 Cerebral edema: Secondary | ICD-10-CM | POA: Diagnosis present

## 2017-11-13 DIAGNOSIS — Z79899 Other long term (current) drug therapy: Secondary | ICD-10-CM | POA: Diagnosis not present

## 2017-11-13 DIAGNOSIS — G43709 Chronic migraine without aura, not intractable, without status migrainosus: Secondary | ICD-10-CM | POA: Diagnosis present

## 2017-11-13 DIAGNOSIS — R5381 Other malaise: Secondary | ICD-10-CM | POA: Diagnosis present

## 2017-11-13 DIAGNOSIS — R0902 Hypoxemia: Secondary | ICD-10-CM | POA: Diagnosis present

## 2017-11-13 DIAGNOSIS — G893 Neoplasm related pain (acute) (chronic): Secondary | ICD-10-CM | POA: Diagnosis present

## 2017-11-13 DIAGNOSIS — Z6833 Body mass index (BMI) 33.0-33.9, adult: Secondary | ICD-10-CM | POA: Diagnosis not present

## 2017-11-13 DIAGNOSIS — Y95 Nosocomial condition: Secondary | ICD-10-CM | POA: Diagnosis present

## 2017-11-13 DIAGNOSIS — J181 Lobar pneumonia, unspecified organism: Secondary | ICD-10-CM | POA: Diagnosis present

## 2017-11-13 DIAGNOSIS — I10 Essential (primary) hypertension: Secondary | ICD-10-CM | POA: Diagnosis present

## 2017-11-13 LAB — CBC
HCT: 38.2 % (ref 36.0–46.0)
Hemoglobin: 12.3 g/dL (ref 12.0–15.0)
MCH: 28.5 pg (ref 26.0–34.0)
MCHC: 32.2 g/dL (ref 30.0–36.0)
MCV: 88.4 fL (ref 78.0–100.0)
Platelets: 199 K/uL (ref 150–400)
RBC: 4.32 MIL/uL (ref 3.87–5.11)
RDW: 14.5 % (ref 11.5–15.5)
WBC: 9.8 K/uL (ref 4.0–10.5)

## 2017-11-13 LAB — BASIC METABOLIC PANEL WITH GFR
Anion gap: 9 (ref 5–15)
BUN: 14 mg/dL (ref 6–20)
CO2: 21 mmol/L — ABNORMAL LOW (ref 22–32)
Calcium: 9.6 mg/dL (ref 8.9–10.3)
Chloride: 108 mmol/L (ref 101–111)
Creatinine, Ser: 0.6 mg/dL (ref 0.44–1.00)
GFR calc Af Amer: >60 mL/min
GFR calc non Af Amer: >60 mL/min
Glucose, Bld: 100 mg/dL — ABNORMAL HIGH (ref 65–99)
Potassium: 3.9 mmol/L (ref 3.5–5.1)
Sodium: 138 mmol/L (ref 135–145)

## 2017-11-13 LAB — ABO/RH: ABO/RH(D): A NEG

## 2017-11-13 LAB — PROCALCITONIN: Procalcitonin: <0.1 ng/mL

## 2017-11-13 LAB — MRSA PCR SCREENING: MRSA by PCR: NEGATIVE

## 2017-11-13 LAB — STREP PNEUMONIAE URINARY ANTIGEN: Strep Pneumo Urinary Antigen: NEGATIVE

## 2017-11-13 LAB — HIV ANTIBODY (ROUTINE TESTING W REFLEX): HIV Screen 4th Generation wRfx: NONREACTIVE

## 2017-11-13 MED ORDER — ROSUVASTATIN CALCIUM 20 MG PO TABS
20.0000 mg | ORAL_TABLET | Freq: Every day | ORAL | Status: DC
  Administered 2017-11-13 – 2017-11-17 (×5): 20 mg via ORAL
  Filled 2017-11-13 (×6): qty 1

## 2017-11-13 MED ORDER — VANCOMYCIN HCL 10 G IV SOLR
1500.0000 mg | Freq: Once | INTRAVENOUS | Status: AC
  Administered 2017-11-13: 1500 mg via INTRAVENOUS
  Filled 2017-11-13: qty 1500

## 2017-11-13 MED ORDER — ONDANSETRON HCL 4 MG/2ML IJ SOLN
4.0000 mg | Freq: Four times a day (QID) | INTRAMUSCULAR | Status: DC | PRN
  Administered 2017-11-14 (×2): 4 mg via INTRAVENOUS
  Filled 2017-11-13 (×2): qty 2

## 2017-11-13 MED ORDER — GUAIFENESIN ER 600 MG PO TB12
600.0000 mg | ORAL_TABLET | Freq: Two times a day (BID) | ORAL | Status: DC
  Administered 2017-11-13 – 2017-11-15 (×3): 600 mg via ORAL
  Filled 2017-11-13 (×5): qty 1

## 2017-11-13 MED ORDER — MORPHINE SULFATE (PF) 2 MG/ML IV SOLN
2.0000 mg | Freq: Four times a day (QID) | INTRAVENOUS | Status: DC | PRN
  Administered 2017-11-13: 2 mg via INTRAVENOUS
  Filled 2017-11-13: qty 1

## 2017-11-13 MED ORDER — LISINOPRIL 20 MG PO TABS
20.0000 mg | ORAL_TABLET | Freq: Every day | ORAL | Status: DC
  Administered 2017-11-13 – 2017-11-18 (×5): 20 mg via ORAL
  Filled 2017-11-13 (×5): qty 1

## 2017-11-13 MED ORDER — OXYCODONE HCL 5 MG PO TABS
5.0000 mg | ORAL_TABLET | Freq: Four times a day (QID) | ORAL | Status: DC | PRN
Start: 2017-11-13 — End: 2017-11-18
  Administered 2017-11-13 – 2017-11-18 (×11): 5 mg via ORAL
  Filled 2017-11-13 (×12): qty 1

## 2017-11-13 MED ORDER — IPRATROPIUM-ALBUTEROL 0.5-2.5 (3) MG/3ML IN SOLN: 3.0000 mL | RESPIRATORY_TRACT | Status: DC | PRN

## 2017-11-13 MED ORDER — NICOTINE 21 MG/24HR TD PT24
21.0000 mg | MEDICATED_PATCH | Freq: Every day | TRANSDERMAL | Status: DC
  Administered 2017-11-13 – 2017-11-17 (×5): 21 mg via TRANSDERMAL
  Filled 2017-11-13 (×6): qty 1

## 2017-11-13 MED ORDER — ACETAMINOPHEN 650 MG RE SUPP: 650.0000 mg | Freq: Four times a day (QID) | RECTAL | Status: DC | PRN

## 2017-11-13 MED ORDER — ACETAMINOPHEN 325 MG PO TABS
650.0000 mg | ORAL_TABLET | Freq: Four times a day (QID) | ORAL | Status: DC | PRN
  Administered 2017-11-13 – 2017-11-17 (×12): 650 mg via ORAL
  Filled 2017-11-13 (×13): qty 2

## 2017-11-13 MED ORDER — LISINOPRIL-HYDROCHLOROTHIAZIDE 20-25 MG PO TABS
1.0000 | ORAL_TABLET | Freq: Every day | ORAL | Status: DC
Start: 2017-11-13 — End: 2017-11-13

## 2017-11-13 MED ORDER — HYDROCODONE-ACETAMINOPHEN 7.5-325 MG/15ML PO SOLN
5.0000 mL | Freq: Four times a day (QID) | ORAL | Status: DC | PRN
Start: 2017-11-13 — End: 2017-11-13
  Administered 2017-11-13: 5 mL via ORAL
  Filled 2017-11-13: qty 15

## 2017-11-13 MED ORDER — HYDROCHLOROTHIAZIDE 25 MG PO TABS
25.0000 mg | ORAL_TABLET | Freq: Every day | ORAL | Status: DC
  Administered 2017-11-13 – 2017-11-18 (×5): 25 mg via ORAL
  Filled 2017-11-13 (×6): qty 1

## 2017-11-13 MED ORDER — MORPHINE SULFATE (PF) 4 MG/ML IV SOLN
2.0000 mg | Freq: Four times a day (QID) | INTRAVENOUS | Status: DC | PRN
  Administered 2017-11-13 (×2): 2 mg via INTRAVENOUS
  Filled 2017-11-13 (×2): qty 1

## 2017-11-13 MED ORDER — SODIUM CHLORIDE 0.9 % IV SOLN
1.0000 g | Freq: Three times a day (TID) | INTRAVENOUS | Status: DC
  Administered 2017-11-13 – 2017-11-16 (×10): 1 g via INTRAVENOUS
  Filled 2017-11-13 (×14): qty 1

## 2017-11-13 MED ORDER — ONDANSETRON HCL 4 MG PO TABS
4.0000 mg | ORAL_TABLET | Freq: Four times a day (QID) | ORAL | Status: DC | PRN
  Administered 2017-11-13: 4 mg via ORAL
  Filled 2017-11-13: qty 1

## 2017-11-13 MED ORDER — SODIUM CHLORIDE 0.9 % IV SOLN
INTRAVENOUS | Status: DC
  Administered 2017-11-13: 06:00:00 via INTRAVENOUS

## 2017-11-13 MED ORDER — VANCOMYCIN HCL IN DEXTROSE 1-5 GM/200ML-% IV SOLN
1000.0000 mg | INTRAVENOUS | Status: DC
  Administered 2017-11-13: 1000 mg via INTRAVENOUS
  Filled 2017-11-13: qty 200

## 2017-11-13 NOTE — Consult Note (Signed)
We were prepping for the patient's upcoming appointment and noticed she was in the process of admission. We will formally consult today or tomorrow and appreciate pulmonary's urgent work up for her probable stage IV lung cancer.     Carola Rhine, PAC

## 2017-11-13 NOTE — ED Notes (Signed)
ED TO INPATIENT HANDOFF REPORT  Name/Age/Gender Jill [name] 57 y.o. female  Code Status    Code Status Orders  (From admission, onward)        Start     Ordered   11/13/17 0109  Full code  Continuous     11/13/17 0112    Code Status History    This patient has a current code status but no historical code status.      Home/SNF/Other Home  Chief Complaint Coughing up Blood   Level of Care/Admitting Diagnosis ED Disposition    ED Disposition Condition [facility]: [place] [[id number]]  Level of Care: Stepdown [14]  Admit to SDU based on following criteria: Respiratory Distress:  Frequent assessment and/or intervention to maintain adequate ventilation/respiration, pulmonary toilet, and respiratory treatment.  Diagnosis: Sepsis due to pneumonia [facility]) [[id number]]  Admitting Physician: Norval Morton [[id number]]  Attending Physician: Norval Morton [[id number]]  Estimated length of stay: past midnight tomorrow  Certification:: I certify this patient will need inpatient services for at least 2 midnights  PT Class (Do Not Modify): Inpatient [101]  PT Acc Code (Do Not Modify): Private [1]       Medical History Past Medical History:  Diagnosis Date  . Cancer ([place])   . Hypertension   . Schizophrenia ([place])     Allergies No Known Allergies  IV Location/Drains/Wounds Patient Lines/Drains/Airways Status   Active Line/Drains/Airways    Name:   Placement date:   Placement time:   Site:   Days:   Peripheral IV 11/12/17 Right Antecubital   11/12/17    2115    Antecubital   1          Labs/Imaging Results for orders placed or performed during the hospital encounter of 11/12/17 (from the past 48 hour(s))  Protime-INR     Status: None   Collection Time: 11/12/17  9:16 PM  Result Value Ref Range   Prothrombin Time 12.9 11.4 - 15.2 seconds   INR 0.98     Comment: Performed at [facility], [place] [address] [place], [place] [address]  Lipase, blood     Status: None   Collection Time: 11/12/17  9:16 PM  Result Value Ref Range   Lipase 45 11 - 51 U/L    Comment: Performed at [facility], [place] [address] [place], [place] [address]  Procalcitonin     Status: None   Collection Time: 11/12/17  9:16 PM  Result Value Ref Range   Procalcitonin <0.10 ng/mL    Comment:        Interpretation: PCT (Procalcitonin) <= 0.5 ng/mL: Systemic infection (sepsis) is not likely. Local bacterial infection is possible. REPEATED TO VERIFY (NOTE)       Sepsis PCT Algorithm           Lower Respiratory Tract                                      Infection PCT Algorithm    ----------------------------     ----------------------------         PCT < 0.25 ng/mL                PCT < 0.10 ng/mL         Strongly encourage             Strongly  discourage   discontinuation of antibiotics    initiation of antibiotics    ----------------------------     -----------------------------       PCT 0.25 - 0.50 ng/mL            PCT 0.10 - 0.25 ng/mL               OR       >80% decrease in PCT            Discourage initiation of                                            antibiotics      Encourage discontinuation           of antibiotics    ----------------------------     -----------------------------         PCT >= 0.50 ng/mL              PCT 0.26 - 0.50 ng/mL                AND       <80% decrease in PCT             Encourage initiation of                                             antibiotics       Encourage continuation           of antibiotics    ----------------------------     -----------------------------        PCT >= 0.50 ng/mL                  PCT > 0.50 ng/mL               AND         increase in PCT                  Strongly encourage                                      initiation of antibiotics    Strongly encourage escalation           of antibiotics                                      -----------------------------                                           PCT <= 0.25 ng/mL                                                 OR                                        >  80% decrease in PCT                                     Discontinue / Do not initiate                                             antibiotics Performed at [facility] [address], [place], [place] [address]   Comprehensive metabolic panel     Status: Abnormal   Collection Time: 11/12/17  9:17 PM  Result Value Ref Range   Sodium 139 135 - 145 mmol/L   Potassium 4.0 3.5 - 5.1 mmol/L   Chloride 104 101 - 111 mmol/L   CO2 22 22 - 32 mmol/L   Glucose, Bld 97 65 - 99 mg/dL   BUN 21 (H) 6 - 20 mg/dL   Creatinine, Ser 0.74 0.44 - 1.00 mg/dL   Calcium 10.1 8.9 - 10.3 mg/dL   Total Protein 7.4 6.5 - 8.1 g/dL   Albumin 4.2 3.5 - 5.0 g/dL   AST 20 15 - 41 U/L   ALT 18 14 - 54 U/L   Alkaline Phosphatase 74 38 - 126 U/L   Total Bilirubin 0.8 0.3 - 1.2 mg/dL   GFR calc non Af Amer >60 >60 mL/min   GFR calc Af Amer >60 >60 mL/min    Comment: (NOTE) The eGFR has been calculated using the CKD EPI equation. This calculation has not been validated in all clinical situations. eGFR's persistently <60 mL/min signify possible Chronic Kidney Disease.    Anion gap 13 5 - 15    Comment: Performed at [facility], [place] [address] [place], [place] [address]  CBC     Status: Abnormal   Collection Time: 11/12/17  9:17 PM  Result Value Ref Range   WBC 12.4 (H) 4.0 - 10.5 K/uL   RBC 4.88 3.87 - 5.11 MIL/uL   Hemoglobin 13.9 12.0 - 15.0 g/dL   HCT 43.1 36.0 - 46.0 %   MCV 88.3 78.0 - 100.0 fL   MCH 28.5 26.0 - 34.0 pg   MCHC 32.3 30.0 - 36.0 g/dL   RDW 14.4 11.5 - 15.5 %   Platelets 240 150 - 400 K/uL    Comment: Performed at [facility], [place] [address] [place], [place] [address]  ABO/Rh     Status: None   Collection Time: 11/12/17  9:17 PM  Result Value Ref  Range   ABO/RH(D)      A NEG Performed at [facility] [address], [place], [place] [address]   Type and screen [place]     Status: None   Collection Time: 11/12/17  9:18 PM  Result Value Ref Range   ABO/RH(D) A NEG    Antibody Screen NEG    Sample Expiration      11/15/2017 Performed at [facility], [place] [address], [place], [place] [address]   I-Stat Troponin, ED (not at [facility])     Status: None   Collection Time: 11/12/17  9:21 PM  Result Value Ref Range   Troponin i, poc 0.00 0.00 - 0.08 ng/mL   Comment 3            Comment: Due to the release  kinetics of cTnI, a negative result within the first hours of the onset of symptoms does not rule out myocardial infarction with certainty. If myocardial infarction is still suspected, repeat the test at appropriate intervals.   I-Stat CG4 Lactic Acid, ED     Status: None   Collection Time: 11/12/17  9:25 PM  Result Value Ref Range   Lactic Acid, Venous 0.93 0.5 - 1.9 mmol/L  Blood culture (routine x 2)     Status: None (Preliminary result)   Collection Time: 11/12/17 10:35 PM  Result Value Ref Range   Specimen Description      BLOOD LEFT WRIST Performed at [facility] [address], [place], [place] [address]    Special Requests      BOTTLES DRAWN AEROBIC AND ANAEROBIC Blood Culture adequate volume Performed at [facility] [address], [place], [place] [address]    Culture PENDING    Report Status PENDING   CBC     Status: None   Collection Time: 11/13/17  5:50 AM  Result Value Ref Range   WBC 9.8 4.0 - 10.5 K/uL   RBC 4.32 3.87 - 5.11 MIL/uL   Hemoglobin 12.3 12.0 - 15.0 g/dL   HCT 38.2 36.0 - 46.0 %   MCV 88.4 78.0 - 100.0 fL   MCH 28.5 26.0 - 34.0 pg   MCHC 32.2 30.0 - 36.0 g/dL   RDW 14.5 11.5 - 15.5 %   Platelets 199 150 - 400 K/uL    Comment: Performed at [facility], [place] [address]  [place], [place] [address]  Basic metabolic panel     Status: Abnormal   Collection Time: 11/13/17  5:50 AM  Result Value Ref Range   Sodium 138 135 - 145 mmol/L   Potassium 3.9 3.5 - 5.1 mmol/L   Chloride 108 101 - 111 mmol/L   CO2 21 (L) 22 - 32 mmol/L   Glucose, Bld 100 (H) 65 - 99 mg/dL   BUN 14 6 - 20 mg/dL   Creatinine, Ser 0.60 0.44 - 1.00 mg/dL   Calcium 9.6 8.9 - 10.3 mg/dL   GFR calc non Af Amer >60 >60 mL/min   GFR calc Af Amer >60 >60 mL/min    Comment: (NOTE) The eGFR has been calculated using the CKD EPI equation. This calculation has not been validated in all clinical situations. eGFR's persistently <60 mL/min signify possible Chronic Kidney Disease.    Anion gap 9 5 - 15    Comment: Performed at [facility], [place] [address], [place], [place] [address]  Strep pneumoniae urinary antigen     Status: None   Collection Time: 11/13/17  5:54 AM  Result Value Ref Range   Strep Pneumo Urinary Antigen NEGATIVE NEGATIVE    Comment:        Infection due to S. pneumoniae cannot be absolutely ruled out since the antigen present may be below the detection limit of the test. Performed at [facility], [address] [place], [place] [address]    Ct Angio Chest Pe W And/or Wo Contrast  Result Date: 11/12/2017 CLINICAL DATA:  Hemoptysis lung tumor EXAM: CT ANGIOGRAPHY CHEST WITH CONTRAST TECHNIQUE: Multidetector CT imaging of the chest was performed using the standard protocol during bolus administration of intravenous contrast. Multiplanar CT image reconstructions and MIPs were obtained to evaluate the vascular anatomy. CONTRAST:  100 cc Isovue 370 intravenous COMPARISON:  Chest x-ray 10/24/2017, CT chest 10/24/2017,  PET-CT 11/08/2017 FINDINGS: Cardiovascular: Significant respiratory motion limits evaluation for pulmonary emboli, mostly within the lower lobes. No acute central embolus is seen. Nonaneurysmal aorta. No dissection. Moderate aortic atherosclerosis.  Coronary vascular calcification. Normal heart size. No pericardial effusion Mediastinum/Nodes: Midline trachea. Coarse calcification right lobe of thyroid. Enlarged mediastinal lymph nodes. Right paratracheal lymph node measures 11 mm. Enlarged right hilar lymph nodes measuring 14 mm. Esophagus within normal limits. Lungs/Pleura: Right infrahilar lung mass measuring approximately 3.5 by 3.1 cm. Right middle lobe lung nodule measuring 18 mm. Mild emphysema. Right lung base mass now confluent with adjacent consolidation making it difficult to measure. Upper Abdomen: No acute abnormality. Stone in the mid pole of left kidney. Musculoskeletal: Left second rib lesion on PET-CT not well demonstrated. No additional suspicious bony findings Review of the MIP images confirms the above findings. IMPRESSION: 1. Limited study secondary to respiratory motion artifact particularly at the lung bases. No definite acute central PE is seen 2. Similar size and appearance of right infrahilar lung mass. Additional mass in the right lower lobe has now become confluent with consolidation in the right lower lobe, making it difficult to measure. Enlarged mediastinal and right hilar lymph nodes as before. 3. Stable right middle lobe 18 mm lung nodule, this is noted to be PET negative 4. Stone in the left kidney Aortic Atherosclerosis (ICD10-I70.0) and Emphysema (ICD10-J43.9). Electronically Signed   By: Donavan Foil M.D.   On: 11/12/2017 23:46    Pending Labs Unresulted Labs (From admission, onward)   Start     Ordered   11/13/17 0500  HIV antibody (Routine Testing)  Tomorrow morning,   R     11/13/17 0112   11/13/17 0120  Culture, sputum-assessment  Once,   R    Question:  Patient immune status  Answer:  Immunocompromised   11/13/17 0119   11/13/17 0120  Gram stain  Once,   R    Question:  Patient immune status  Answer:  Immunocompromised   11/13/17 0119   11/13/17 0120  Legionella Pneumophila Serogp 1 Ur Ag  Once,   R      11/13/17 0119   11/12/17 2118  Blood culture (routine x 2)  BLOOD CULTURE X 2,   STAT     11/12/17 2117      Vitals/Pain Today's Vitals   11/13/17 1246 11/13/17 1300 11/13/17 1330 11/13/17 1400  BP: 105/61 107/68 (!) 113/93 105/69  Pulse: (!) 119 (!) 123 (!) 120 (!) 118  Resp: 20     Temp:      TempSrc:      SpO2: 96% 91% 90% 91%  Weight:      Height:      PainSc: 5        Isolation Precautions No active isolations  Medications Medications  nicotine (NICODERM CQ - dosed in mg/24 hours) patch 14 mg (14 mg Transdermal Patch Applied 11/12/17 2152)  nicotine (NICODERM CQ - dosed in mg/24 hours) patch 21 mg (21 mg Transdermal Refused 11/13/17 0906)  rosuvastatin (CRESTOR) tablet 20 mg (has no administration in time range)  ondansetron (ZOFRAN) tablet 4 mg (has no administration in time range)    Or  ondansetron (ZOFRAN) injection 4 mg (has no administration in time range)  0.9 %  sodium chloride infusion ( Intravenous New Bag/Given 11/13/17 0556)  acetaminophen (TYLENOL) tablet 650 mg (650 mg Oral Given 11/13/17 1249)    Or  acetaminophen (TYLENOL) suppository 650 mg ( Rectal See Alternative 11/13/17 1249)  ipratropium-albuterol (  DUONEB) 0.5-2.5 (3) MG/3ML nebulizer solution 3 mL (has no administration in time range)  guaiFENesin (MUCINEX) 12 hr tablet 600 mg (600 mg Oral Refused 11/13/17 1023)  lisinopril (PRINIVIL,ZESTRIL) tablet 20 mg (20 mg Oral Given 11/13/17 1022)  hydrochlorothiazide (HYDRODIURIL) tablet 25 mg (25 mg Oral Given 11/13/17 1021)  ceFEPIme (MAXIPIME) 1 g in sodium chloride 0.9 % 100 mL IVPB (0 g Intravenous Stopped 11/13/17 0641)  vancomycin (VANCOCIN) IVPB 1000 mg/200 mL premix (has no administration in time range)  oxyCODONE (Oxy IR/ROXICODONE) immediate release tablet 5 mg (has no administration in time range)  morphine 2 MG/ML injection 2 mg (2 mg Intravenous Given 11/13/17 1022)  morphine 4 MG/ML injection 4 mg (4 mg Intravenous Given 11/12/17 2149)   prochlorperazine (COMPAZINE) injection 10 mg (10 mg Intravenous Given 11/12/17 2148)  iopamidol (ISOVUE-370) 76 % injection 100 mL (100 mLs Intravenous Contrast Given 11/12/17 2241)  ceFEPIme (MAXIPIME) 1 g in sodium chloride 0.9 % 100 mL IVPB (0 g Intravenous Stopped 11/13/17 0113)  vancomycin (VANCOCIN) 1,500 mg in sodium chloride 0.9 % 500 mL IVPB (0 mg Intravenous Stopped 11/13/17 0541)    Mobility walks with person assist

## 2017-11-13 NOTE — H&P (Signed)
History and Physical    Jill [name]:[id number] DOB: December 09, 1960 DOA: 11/12/2017  Referring MD/NP/PA: Dr. Marda Stalker PCP: [facility], User, MD (Inactive)  Patient coming from: Home  Chief Complaint: Coughing up blood  I have personally briefly reviewed patient's old medical records in [place]   HPI: Jill [name] is a 57 y.o. female with medical history significant of metastatic lung cancer, HTN, hyperlipidemia, schizophrenia, and tobacco abuse who presents with complaints of coughing up blood; yesterday.  Patient was recently diagnosed with metastatic lung cancer.  She has been in the process of being worked up previously seen by Dr. Roxan Hockey of cardiothoracic surgery, and has a  appointment with [facility] radiology oncology scheduled for coming Thursday.  Over the last month patient reports having a cough for which she had been diagnosed with pneumonia.  Initially treated with 2 weeks of amoxicillin, but reported no improvement in symptoms.  Subsequently, was given a third round of antibiotics without any significant change.  Yesterday, patient reports coughing up bright red blood. Associated symptoms of night sweats, right-sided headache that she associates possibly with metastatic brain lesion on the right side, left rib pain that may correlate with known metastatic rib lesion, and shortness of breath.  Headache pain is reportedly behind her right temple, eye, and radiates into her jaw.  She continues to smoke but has cut back to around 2 cigarettes/day reporting utilizing a nicotine patch and nicotine lozenges.  Patient actually had undergone PET scan and MRI of the brain on 5/8, which showed postobstructive pneumonia with interval improvement   ED Course: Upon admission into the emergency department patient was seen to be afebrile, pulse 98 -119, respirations 18-23,, blood pressure 101/57 until 132/83, and O2 saturations reported in the upper 80's on 2 L nasal  cannula oxygen.  Labs revealed WBC at 12.4, BUN 21, creatinine 0.74, and lactic acid 0.  CT angiogram showing right sided lung mass previously noted with right lower lobe mass with worsening confluent consolidation.  Blood cultures were obtained and patient was started on empiric antibiotics of vancomycin and cefepime.    Review of Systems  Constitutional: Positive for diaphoresis and malaise/fatigue. Negative for fever.  HENT: Negative for ear discharge and nosebleeds.   Eyes: Negative for double vision and photophobia.  Respiratory: Positive for cough, hemoptysis and shortness of breath.   Cardiovascular: Negative for chest pain and leg swelling.       Positive for chest tightness  Gastrointestinal: Positive for nausea. Negative for abdominal pain and vomiting.  Genitourinary: Negative for dysuria and frequency.  Musculoskeletal: Positive for myalgias. Negative for falls.       Positive for rib pain  Skin: Negative for itching and rash.  Neurological: Positive for headaches. Negative for loss of consciousness.  Endo/Heme/Allergies: Negative for polydipsia.  Psychiatric/Behavioral: Negative for memory loss and suicidal ideas.    Past Medical History:  Diagnosis Date  . Cancer ([place])   . Hypertension   . Schizophrenia [facility])     Past Surgical History:  Procedure Laterality Date  . HERNIA REPAIR    . KIDNEY SURGERY       reports that she has been smoking cigarettes.  She started smoking about 36 years ago. She has a 72.00 pack-year smoking history. She has never used smokeless tobacco. She reports that she does not drink alcohol or use drugs.  No Known Allergies  History reviewed. No pertinent family history.  Prior to Admission medications   Medication Sig Start  Date End Date Taking? Authorizing Provider  aspirin EC 81 MG tablet Take 162 mg by mouth daily.    Yes [provider]  aspirin-acetaminophen-caffeine (EXCEDRIN MIGRAINE) [phone] MG tablet Take 2 tablets by  mouth every 6 (six) hours as needed for headache.   Yes [provider]  haloperidol decanoate (HALDOL DECANOATE) 100 MG/ML injection Inject into the muscle every 28 (twenty-eight) days. [place] by Filomena Jungling [phone]   Yes [provider]  HYDROcodone-acetaminophen (HYCET) 7.5-325 mg/15 ml solution Take 5 mLs by mouth at bedtime.   Yes [provider]  lisinopril-hydrochlorothiazide (PRINZIDE,ZESTORETIC) 20-25 MG tablet Take 1 tablet by mouth daily.   Yes [provider]  nicotine (NICODERM CQ - DOSED IN MG/24 HOURS) 21 mg/24hr patch Place 21 mg onto the skin daily.   Yes [provider]  promethazine (PHENERGAN) 12.5 MG tablet Take 12.5 mg by mouth as needed for nausea/vomiting. 11/12/17  Yes [provider]  rosuvastatin (CRESTOR) 20 MG tablet Take 20 mg by mouth daily at 6 PM.   Yes [provider]    Physical Exam:  Constitutional: NAD, calm, comfortable Vitals:   11/12/17 2145 11/12/17 2200 11/12/17 2300 11/13/17 0000  BP:  107/61 110/67 (!) 101/57  Pulse: (!) 106 (!) 108 98 (!) 101  Resp: 19 (!) 22 (!) 23 (!) 22  Temp:      TempSrc:      SpO2: 94% 95% 94% 92%  Weight:      Height:       Eyes: PERRL, lids and conjunctivae normal ENMT: Mucous membranes are moist. Posterior pharynx clear of any exudate or lesions.Normal dentition.  Neck: normal, supple, no masses, no thyromegaly Respiratory: Some mild rhonchi noted in the lower lung field with decreased breath sounds.  No significant wheezing appreciated.  Patient on 2 L nasal cannula oxygen with O2 saturations in the low 90s.  Cardiovascular: Regular rate and rhythm, no murmurs / rubs / gallops. No extremity edema. 2+ pedal pulses. No carotid bruits.  Abdomen: no tenderness, no masses palpated. No hepatosplenomegaly. Bowel sounds positive.  Musculoskeletal: no clubbing / cyanosis. No joint deformity upper and lower extremities. Good ROM, no  contractures. Normal muscle tone.  Skin: no rashes, lesions, ulcers. No induration Neurologic: CN 2-12 grossly intact. Sensation intact, DTR normal. Strength 5/5 in all 4.  Psychiatric: Normal judgment and insight. Alert and oriented x 3. Normal mood.     Labs on Admission: I have personally reviewed following labs and imaging studies  CBC: Recent Labs  Lab 11/12/17 2117  WBC 12.4*  HGB 13.9  HCT 43.1  MCV 88.3  PLT 500   Basic Metabolic Panel: Recent Labs  Lab 11/08/17 0750 11/12/17 2117  NA  --  139  K  --  4.0  CL  --  104  CO2  --  22  GLUCOSE  --  97  BUN  --  21*  CREATININE 0.80 0.74  CALCIUM  --  10.1   GFR: Estimated Creatinine Clearance: 76 mL/min (by C-G formula based on SCr of 0.74 mg/dL). Liver Function Tests: Recent Labs  Lab 11/12/17 2117  AST 20  ALT 18  ALKPHOS 74  BILITOT 0.8  PROT 7.4  ALBUMIN 4.2   Recent Labs  Lab 11/12/17 2116  LIPASE 45   No results for input(s): AMMONIA in the last 168 hours. Coagulation Profile: Recent Labs  Lab 11/12/17 2116  INR 0.98   Cardiac Enzymes: No results for  input(s): CKTOTAL, CKMB, CKMBINDEX, TROPONINI in the last 168 hours. BNP (last 3 results) No results for input(s): PROBNP in the last 8760 hours. HbA1C: No results for input(s): HGBA1C in the last 72 hours. CBG: Recent Labs  Lab 11/08/17 0917  GLUCAP 97   Lipid Profile: No results for input(s): CHOL, HDL, LDLCALC, TRIG, CHOLHDL, LDLDIRECT in the last 72 hours. Thyroid Function Tests: No results for input(s): TSH, T4TOTAL, FREET4, T3FREE, THYROIDAB in the last 72 hours. Anemia Panel: No results for input(s): VITAMINB12, FOLATE, FERRITIN, TIBC, IRON, RETICCTPCT in the last 72 hours. Urine analysis: No results found for: COLORURINE, APPEARANCEUR, LABSPEC, PHURINE, GLUCOSEU, HGBUR, BILIRUBINUR, KETONESUR, PROTEINUR, UROBILINOGEN, NITRITE, LEUKOCYTESUR Sepsis Labs: No results found for this or any previous visit (from the past 240  hour(s)).   Radiological Exams on Admission: Ct Angio Chest Pe W And/or Wo Contrast  Result Date: 11/12/2017 CLINICAL DATA:  Hemoptysis lung tumor EXAM: CT ANGIOGRAPHY CHEST WITH CONTRAST TECHNIQUE: Multidetector CT imaging of the chest was performed using the standard protocol during bolus administration of intravenous contrast. Multiplanar CT image reconstructions and MIPs were obtained to evaluate the vascular anatomy. CONTRAST:  100 cc Isovue 370 intravenous COMPARISON:  Chest x-ray 10/24/2017, CT chest 10/24/2017, PET-CT 11/08/2017 FINDINGS: Cardiovascular: Significant respiratory motion limits evaluation for pulmonary emboli, mostly within the lower lobes. No acute central embolus is seen. Nonaneurysmal aorta. No dissection. Moderate aortic atherosclerosis. Coronary vascular calcification. Normal heart size. No pericardial effusion Mediastinum/Nodes: Midline trachea. Coarse calcification right lobe of thyroid. Enlarged mediastinal lymph nodes. Right paratracheal lymph node measures 11 mm. Enlarged right hilar lymph nodes measuring 14 mm. Esophagus within normal limits. Lungs/Pleura: Right infrahilar lung mass measuring approximately 3.5 by 3.1 cm. Right middle lobe lung nodule measuring 18 mm. Mild emphysema. Right lung base mass now confluent with adjacent consolidation making it difficult to measure. Upper Abdomen: No acute abnormality. Stone in the mid pole of left kidney. Musculoskeletal: Left second rib lesion on PET-CT not well demonstrated. No additional suspicious bony findings Review of the MIP images confirms the above findings. IMPRESSION: 1. Limited study secondary to respiratory motion artifact particularly at the lung bases. No definite acute central PE is seen 2. Similar size and appearance of right infrahilar lung mass. Additional mass in the right lower lobe has now become confluent with consolidation in the right lower lobe, making it difficult to measure. Enlarged mediastinal and right  hilar lymph nodes as before. 3. Stable right middle lobe 18 mm lung nodule, this is noted to be PET negative 4. Stone in the left kidney Aortic Atherosclerosis (ICD10-I70.0) and Emphysema (ICD10-J43.9). Electronically Signed   By: Donavan Foil M.D.   On: 11/12/2017 23:46    EKG: Independently reviewed.  Sinus tachycardia 105 bpm  Assessment/Plan Sepsis secondary to postobstructive pneumonia: Acute.  Patient presents with complaints of hemoptysis and worsening shortness of breath.  Initially found to be tachycardic and tachypneic with WBC elevated 12.1.  Lactic acid noted to be reassuring.  Previously treated for pneumonia with 2 separate antibiotics for a total of 3 weeks without relief of symptoms.  CT imaging showing no signs of pulmonary embolus, but worsening confluent consolidation of the right lower lung field. -  Admit to a stepdown bed - Follow-up blood and sputum cultures - Add on procalcitonin - Continue empiric antibiotics of cefepime and vancomycin   - Mucinex   Hypoxia: Acute.  Patient O2 sats noted to be the upper 80s on admission.  Patient was placed on nasal cannula oxygen  to maintain O2 saturations. - Continuous pulse oximetry with nasal cannula oxygen as needed - May likely need to evaluate patient for need of home oxygen prior to discharge  Hemoptysis: Acute.  Patient presents coughing up blood approximately 20 cc present in emesis bag. - Hold aspirin  - Monitor intake and output - Will likely warrant consult to pulmonary in a.m.  Right-sided lung cancer with metastases: Patient with metastases to a right 11 mm lesion of the parietal-occipital suspected brain, left 2nd rib, left femur, and other areas as documented.  Reports upcoming appointment to establish care with Dr. Lisbeth Renshaw of radiology oncology scheduled for this Thursday.  - Continue hydrocodone prn pain - Notify Dr. Lisbeth Renshaw the patient is admitted in the hospital   Headache: Acute.  Symptoms likely be related to  metastases. - Continue symptomatic treatment   Schizophrenia: Patient on Haldol injections next due on 5/15. - May consider confirming Haldol dose and give as scheduled  Nicotine abuse: Patient admits trying to quit currently utilizing nicotine patch and lozenges. - Continue nicotine patch - Counseled on the need of cessation of tobacco  DVT prophylaxis: SCDs Code Status: Full Family Communication: No family present at bedside Disposition Plan: Likely discharge home once medically stable Consults called: None Admission status: Inpatient  Norval Morton MD [facility] Pager [phone]   If 7PM-7AM, please contact night-coverage [email or web address] Password [facility]  11/13/2017, 12:52 AM

## 2017-11-13 NOTE — Progress Notes (Signed)
Patient ID: Jill [name], female   DOB: 1961/01/26, 57 y.o.   MRN: [id number] Patient was admitted early this morning for hemoptysis.  CT chest showed lung mass with consolidation.  Patient is on broad-spectrum antibiotics.  Patient seen and examined at bedside and plan of care discussed with her.  I have reviewed her medical records including this morning's H&P myself.  I have spoken to Dr. Leron Croak regarding the patient as patient might need bronchoscopy for tissue diagnosis of the lung mass.

## 2017-11-13 NOTE — Consult Note (Addendum)
Name: Jill [name] MRN: [id number] DOB: 07-03-1961    ADMISSION DATE:  11/12/2017 CONSULTATION DATE:  5/13  REFERRING MD :  Holley Raring  CHIEF COMPLAINT: Hemoptysis and lung mass  History of present illness This is a 57 year old smoker who was actually seen in consultation by Dr. Roxan Hockey on 4/30 for a new right lung mass who had initiated evaluation including: MRI brain as well as PET scan (see studies below) with the studies both suggesting metastasis to the brain, lumbar spine, left second rib, and left femur.  She was to follow-up these tests the week of 5/13.  On 5/12 she began to acutely feel ill.  She developed relatively sudden chest congestion followed by hemoptysis.  She had some chest discomfort/pain associated with this.  She reported the volume of hemoptysis estimated approximately 1 tablespoon at a time with dark bloody mucus, with a total volume of approximately 200 mL.  This is since slowed down in quantity and now has streaky hemoptysis barely covering the bottom of a thyroid phone, since midnight 5/13.  She has had no fever, recent sick exposure, but has had poor appetite, weight loss, and worsening activity levels.  She also has had significant discomfort this is included headache behind her right temple, photosensitivity, and generalized discomfort.  She is been working on cutting down smoking down to 2 cigarettes a day she has a nicotine patch as well as nicotine lozenges.  As part of her evaluation she had a repeat CT of chest showed right infrahilar lung mass, right lower lobe lung mass confluent with consolidation in the right lower lobe.  Also had mediastinal lymph nodes and hilar lymph nodes as present.  Pulmonary was asked to see given concern of malignancy and to assist with diagnostic evaluation.  SIGNIFICANT EVENTS    STUDIES:  MRI brain 5/8: Early metastasis in the brain with a solitary lesion of 11 mm in the left parietal occipital sulcus with mild associated  regional edema  PET scan 5/8: 1. The large right lower lobe mass and the associated mediastinal and right hilar adenopathy are hypermetabolic, consistent with lung cancer. In addition, there are several hypermetabolic osseous lesions consistent with metastatic disease. Stage IV disease, [id number]. 2. Slight improvement in postobstructive pneumonitis in the right lower lobe. 3. The right middle lobe nodule does not demonstrate hypermetabolic activity. 4. Incidental findings including bilateral nephrolithiasis and Aortic Atherosclerosis (ICD10-I70.0). Chest CT 5/12:1. Limited study secondary to respiratory motion artifact particularly at the lung bases. No definite acute central PE is seen 2. Similar size and appearance of right infrahilar lung mass. Additional mass in the right lower lobe has now become confluent with consolidation in the right lower lobe, making it difficult to measure. Enlarged mediastinal and right hilar lymph nodes as before. 3. Stable right middle lobe 18 mm lung nodule, this is noted to be PET negative 4. Stone in the left kidney   HISTORY OF PRESENT ILLNESS: See above PAST MEDICAL HISTORY :   has a past medical history of Cancer ([place]), Hypertension, and Schizophrenia ([place]).  has a past surgical history that includes Kidney surgery and Hernia repair. Prior to Admission medications   Medication Sig Start Date End Date Taking? Authorizing Provider  aspirin EC 81 MG tablet Take 162 mg by mouth daily.    Yes [provider]  aspirin-acetaminophen-caffeine (EXCEDRIN MIGRAINE) [phone] MG tablet Take 2 tablets by mouth every 6 (six) hours as needed for headache.   Yes [provider]  haloperidol decanoate (  HALDOL DECANOATE) 100 MG/ML injection Inject into the muscle every 28 (twenty-eight) days. [place] by Filomena Jungling [phone]   Yes [provider]  HYDROcodone-acetaminophen (HYCET) 7.5-325 mg/15 ml solution Take  5 mLs by mouth at bedtime.   Yes [provider]  lisinopril-hydrochlorothiazide (PRINZIDE,ZESTORETIC) 20-25 MG tablet Take 1 tablet by mouth daily.   Yes [provider]  nicotine (NICODERM CQ - DOSED IN MG/24 HOURS) 21 mg/24hr patch Place 21 mg onto the skin daily.   Yes [provider]  promethazine (PHENERGAN) 12.5 MG tablet Take 12.5 mg by mouth as needed for nausea/vomiting. 11/12/17  Yes [provider]  rosuvastatin (CRESTOR) 20 MG tablet Take 20 mg by mouth daily at 6 PM.   Yes [provider]   No Known Allergies  FAMILY HISTORY:  family history is not on file. SOCIAL HISTORY:  reports that she has been smoking cigarettes.  She started smoking about 36 years ago. She has a 72.00 pack-year smoking history. She has never used smokeless tobacco. She reports that she does not drink alcohol or use drugs.  REVIEW OF SYSTEMS:   Constitutional: Negative for fever, chills, weight loss, malaise/fatigue and diaphoresis.  HENT: Negative for hearing loss, ear pain, nosebleeds, congestion, sore throat, neck pain, tinnitus and ear discharge.  Temporal headache Eyes: Negative for blurred vision, double vision, photosensitivity, pain, discharge and redness.  Respiratory: Negative for cough, positive hemoptysis,, shortness of breath, wheezing and stridor.   Cardiovascular: Negative for chest pain with cough, palpitations, orthopnea, claudication, leg swelling and PND.  Gastrointestinal: Negative for heartburn, nausea, vomiting, abdominal pain, diarrhea, constipation, blood in stool and melena. Anorexia Genitourinary: Negative for dysuria, urgency, frequency, hematuria and flank pain.  Musculoskeletal: Negative for myalgias, back pain, joint pain and falls.  Skin: Negative for itching and rash.  Neurological: Negative for dizziness, tingling, tremors, sensory change, speech change, focal weakness, seizures, loss of consciousness, weakness and headaches.    Endo/Heme/Allergies: Negative for environmental allergies and polydipsia. Does not bruise/bleed easily.  SUBJECTIVE:  No acute distress VITAL SIGNS: Temp:  [98.2 F (36.8 C)] 98.2 F (36.8 C) (05/12 2025) Pulse Rate:  [92-119] 113 (05/13 1000) Resp:  [14-34] 21 (05/13 1000) BP: (94-132)/(51-93) 131/78 (05/13 1000) SpO2:  [90 %-97 %] 91 % (05/13 1000) Weight:  [180 lb (81.6 kg)] 180 lb (81.6 kg) (05/12 2025)  PHYSICAL EXAMINATION: General: 57 year old white female resting comfortably in bed Neuro: Awake alert oriented no focal deficits HEENT: Normocephalic atraumatic no jugular venous distention mucous membranes moist Cardiovascular: Regular rate and rhythm Lungs: Diminished right base, scattered rhonchi, no accessory use Abdomen: Soft nontender no organomegaly Musculoskeletal: Equal strength and bulk Skin: Warm and dry  Recent Labs  Lab 11/08/17 0750 11/12/17 2117 11/13/17 0550  NA  --  139 138  K  --  4.0 3.9  CL  --  104 108  CO2  --  22 21*  BUN  --  21* 14  CREATININE 0.80 0.74 0.60  GLUCOSE  --  97 100*   Recent Labs  Lab 11/12/17 2117 11/13/17 0550  HGB 13.9 12.3  HCT 43.1 38.2  WBC 12.4* 9.8  PLT 240 199   Ct Angio Chest Pe W And/or Wo Contrast  Result Date: 11/12/2017 CLINICAL DATA:  Hemoptysis lung tumor EXAM: CT ANGIOGRAPHY CHEST WITH CONTRAST TECHNIQUE: Multidetector CT imaging of the chest was performed using the standard protocol during bolus administration of intravenous contrast. Multiplanar CT image reconstructions and MIPs were obtained  to evaluate the vascular anatomy. CONTRAST:  100 cc Isovue 370 intravenous COMPARISON:  Chest x-ray 10/24/2017, CT chest 10/24/2017, PET-CT 11/08/2017 FINDINGS: Cardiovascular: Significant respiratory motion limits evaluation for pulmonary emboli, mostly within the lower lobes. No acute central embolus is seen. Nonaneurysmal aorta. No dissection. Moderate aortic atherosclerosis. Coronary vascular calcification. Normal  heart size. No pericardial effusion Mediastinum/Nodes: Midline trachea. Coarse calcification right lobe of thyroid. Enlarged mediastinal lymph nodes. Right paratracheal lymph node measures 11 mm. Enlarged right hilar lymph nodes measuring 14 mm. Esophagus within normal limits. Lungs/Pleura: Right infrahilar lung mass measuring approximately 3.5 by 3.1 cm. Right middle lobe lung nodule measuring 18 mm. Mild emphysema. Right lung base mass now confluent with adjacent consolidation making it difficult to measure. Upper Abdomen: No acute abnormality. Stone in the mid pole of left kidney. Musculoskeletal: Left second rib lesion on PET-CT not well demonstrated. No additional suspicious bony findings Review of the MIP images confirms the above findings. IMPRESSION: 1. Limited study secondary to respiratory motion artifact particularly at the lung bases. No definite acute central PE is seen 2. Similar size and appearance of right infrahilar lung mass. Additional mass in the right lower lobe has now become confluent with consolidation in the right lower lobe, making it difficult to measure. Enlarged mediastinal and right hilar lymph nodes as before. 3. Stable right middle lobe 18 mm lung nodule, this is noted to be PET negative 4. Stone in the left kidney Aortic Atherosclerosis (ICD10-I70.0) and Emphysema (ICD10-J43.9). Electronically Signed   By: Donavan Foil M.D.   On: 11/12/2017 23:46    ASSESSMENT / PLAN: Hemoptysis Right hilar and lower lobe lung mass Probable metastatic lesions involving: Brain, lumbar spine, left second rib and left femur. Postobstructive atelectasis Tobacco abuse Bipolar disease on intramuscular Haldol  Discussion Hemoptysis in the setting of right hilar, right lower lobe lung mass with associated postobstructive atelectasis as well as marked areas of metastatic lesions including the brain, lumbar spine, left second rib and left femur.  Suspect hemoptysis represents endobronchial  lesion.  Plan Will need tissue biopsy: will discuss with Dr. Halford Chessman certainly amendable to standard bronchoscopy but will defer to him as to if EBUS better diagnostic modality  would go ahead and alert radiation oncology of her admission, she is to see Dr. Lisbeth Renshaw later this week anyway. Reasonable to treat for post-obst PNA for now Will try to get her set up in next day or two  Agree w/ nicotine patch. OK from our stand-point if she wants to cont the lozenges from home  She would like to have her haldol shot while here-->will defer to medicine team    Erick Colace ACNP-BC [place] Pager # [phone] OR # [phone] if no answer   11/13/2017, 11:34 AM

## 2017-11-13 NOTE — ED Notes (Signed)
ED TO INPATIENT HANDOFF REPORT  Name/Age/Gender Jill [name] 57 y.o. female  Code Status    Code Status Orders  (From admission, onward)        Start     Ordered   11/13/17 0109  Full code  Continuous     11/13/17 0112    Code Status History    This patient has a current code status but no historical code status.      Home/SNF/Other Home  Chief Complaint Coughing up Blood   Level of Care/Admitting Diagnosis ED Disposition    ED Disposition Condition [facility]: [place] [[id number]]  Level of Care: Stepdown [14]  Admit to SDU based on following criteria: Respiratory Distress:  Frequent assessment and/or intervention to maintain adequate ventilation/respiration, pulmonary toilet, and respiratory treatment.  Diagnosis: Sepsis due to pneumonia [facility]) [[id number]]  Admitting Physician: Norval Morton [[id number]]  Attending Physician: Norval Morton [[id number]]  Estimated length of stay: past midnight tomorrow  Certification:: I certify this patient will need inpatient services for at least 2 midnights  PT Class (Do Not Modify): Inpatient [101]  PT Acc Code (Do Not Modify): Private [1]       Medical History Past Medical History:  Diagnosis Date  . Cancer ([place])   . Hypertension   . Schizophrenia ([place])     Allergies No Known Allergies  IV Location/Drains/Wounds Patient Lines/Drains/Airways Status   Active Line/Drains/Airways    Name:   Placement date:   Placement time:   Site:   Days:   Peripheral IV 11/12/17 Right Antecubital   11/12/17    2115    Antecubital   1          Labs/Imaging Results for orders placed or performed during the hospital encounter of 11/12/17 (from the past 48 hour(s))  Protime-INR     Status: None   Collection Time: 11/12/17  9:16 PM  Result Value Ref Range   Prothrombin Time 12.9 11.4 - 15.2 seconds   INR 0.98     Comment: Performed at [facility], [place] [address], [place], [place] [address]  Lipase, blood     Status: None   Collection Time: 11/12/17  9:16 PM  Result Value Ref Range   Lipase 45 11 - 51 U/L    Comment: Performed at [facility], [place] [address] [place],  [address]  Comprehensive metabolic panel     Status: Abnormal   Collection Time: 11/12/17  9:17 PM  Result Value Ref Range   Sodium 139 135 - 145 mmol/L   Potassium 4.0 3.5 - 5.1 mmol/L   Chloride 104 101 - 111 mmol/L   CO2 22 22 - 32 mmol/L   Glucose, Bld 97 65 - 99 mg/dL   BUN 21 (H) 6 - 20 mg/dL   Creatinine, Ser 0.74 0.44 - 1.00 mg/dL   Calcium 10.1 8.9 - 10.3 mg/dL   Total Protein 7.4 6.5 - 8.1 g/dL   Albumin 4.2 3.5 - 5.0 g/dL   AST 20 15 - 41 U/L   ALT 18 14 - 54 U/L   Alkaline Phosphatase 74 38 - 126 U/L   Total Bilirubin 0.8 0.3 - 1.2 mg/dL   GFR calc non Af Amer >60 >60 mL/min   GFR calc Af Amer >60 >60 mL/min    Comment: (NOTE) The eGFR has been calculated using the CKD EPI equation. This calculation has not been validated in all  clinical situations. eGFR's persistently <60 mL/min signify possible Chronic Kidney Disease.    Anion gap 13 5 - 15    Comment: Performed at [facility], [place] [address], [place], [place] [address]  CBC     Status: Abnormal   Collection Time: 11/12/17  9:17 PM  Result Value Ref Range   WBC 12.4 (H) 4.0 - 10.5 K/uL   RBC 4.88 3.87 - 5.11 MIL/uL   Hemoglobin 13.9 12.0 - 15.0 g/dL   HCT 43.1 36.0 - 46.0 %   MCV 88.3 78.0 - 100.0 fL   MCH 28.5 26.0 - 34.0 pg   MCHC 32.3 30.0 - 36.0 g/dL   RDW 14.4 11.5 - 15.5 %   Platelets 240 150 - 400 K/uL    Comment: Performed at [place] [facility], [place] [address], [place], [place] [address]  ABO/Rh     Status: None   Collection Time: 11/12/17  9:17 PM  Result Value Ref Range   ABO/RH(D)      A NEG Performed at [facility] [address] [place], [place] [address]   Type and screen [place]      Status: None   Collection Time: 11/12/17  9:18 PM  Result Value Ref Range   ABO/RH(D) A NEG    Antibody Screen NEG    Sample Expiration      11/15/2017 Performed at [facility], [place] [address], [place], [place] [address]   I-Stat Troponin, ED (not at [facility])     Status: None   Collection Time: 11/12/17  9:21 PM  Result Value Ref Range   Troponin i, poc 0.00 0.00 - 0.08 ng/mL   Comment 3            Comment: Due to the release kinetics of cTnI, a negative result within the first hours of the onset of symptoms does not rule out myocardial infarction with certainty. If myocardial infarction is still suspected, repeat the test at appropriate intervals.   I-Stat CG4 Lactic Acid, ED     Status: None   Collection Time: 11/12/17  9:25 PM  Result Value Ref Range   Lactic Acid, Venous 0.93 0.5 - 1.9 mmol/L   Ct Angio Chest Pe W And/or Wo Contrast  Result Date: 11/12/2017 CLINICAL DATA:  Hemoptysis lung tumor EXAM: CT ANGIOGRAPHY CHEST WITH CONTRAST TECHNIQUE: Multidetector CT imaging of the chest was performed using the standard protocol during bolus administration of intravenous contrast. Multiplanar CT image reconstructions and MIPs were obtained to evaluate the vascular anatomy. CONTRAST:  100 cc Isovue 370 intravenous COMPARISON:  Chest x-ray 10/24/2017, CT chest 10/24/2017, PET-CT 11/08/2017 FINDINGS: Cardiovascular: Significant respiratory motion limits evaluation for pulmonary emboli, mostly within the lower lobes. No acute central embolus is seen. Nonaneurysmal aorta. No dissection. Moderate aortic atherosclerosis. Coronary vascular calcification. Normal heart size. No pericardial effusion Mediastinum/Nodes: Midline trachea. Coarse calcification right lobe of thyroid. Enlarged mediastinal lymph nodes. Right paratracheal lymph node measures 11 mm. Enlarged right hilar lymph nodes measuring 14 mm. Esophagus within normal limits. Lungs/Pleura: Right infrahilar lung mass  measuring approximately 3.5 by 3.1 cm. Right middle lobe lung nodule measuring 18 mm. Mild emphysema. Right lung base mass now confluent with adjacent consolidation making it difficult to measure. Upper Abdomen: No acute abnormality. Stone in the mid pole of left kidney. Musculoskeletal: Left second rib lesion on PET-CT not well demonstrated. No additional suspicious bony findings Review of the MIP images confirms the above  findings. IMPRESSION: 1. Limited study secondary to respiratory motion artifact particularly at the lung bases. No definite acute central PE is seen 2. Similar size and appearance of right infrahilar lung mass. Additional mass in the right lower lobe has now become confluent with consolidation in the right lower lobe, making it difficult to measure. Enlarged mediastinal and right hilar lymph nodes as before. 3. Stable right middle lobe 18 mm lung nodule, this is noted to be PET negative 4. Stone in the left kidney Aortic Atherosclerosis (ICD10-I70.0) and Emphysema (ICD10-J43.9). Electronically Signed   By: Donavan Foil M.D.   On: 11/12/2017 23:46    Pending Labs Unresulted Labs (From admission, onward)   Start     Ordered   11/13/17 0500  HIV antibody (Routine Testing)  Tomorrow morning,   R     11/13/17 0112   11/13/17 0500  CBC  Tomorrow morning,   R     11/13/17 0112   11/13/17 2992  Basic metabolic panel  Tomorrow morning,   R     11/13/17 0112   11/13/17 0120  Culture, sputum-assessment  Once,   R    Question:  Patient immune status  Answer:  Immunocompromised   11/13/17 0119   11/13/17 0120  Gram stain  Once,   R    Question:  Patient immune status  Answer:  Immunocompromised   11/13/17 0119   11/13/17 0120  Strep pneumoniae urinary antigen  Once,   R     11/13/17 0119   11/13/17 0120  Legionella Pneumophila Serogp 1 Ur Ag  Once,   R     11/13/17 0119   11/13/17 0112  Procalcitonin  Add-on,   R     11/13/17 0112   11/12/17 2118  Blood culture (routine x 2)  BLOOD  CULTURE X 2,   STAT     11/12/17 2117      Vitals/Pain Today's Vitals   11/12/17 2300 11/13/17 0000 11/13/17 0100 11/13/17 0151  BP: 110/67 (!) 101/57 109/70   Pulse: 98 (!) 101 92   Resp: (!) 23 (!) 22 19   Temp:      TempSrc:      SpO2: 94% 92% 90%   Weight:      Height:      PainSc:    5     Isolation Precautions No active isolations  Medications Medications  nicotine (NICODERM CQ - dosed in mg/24 hours) patch 14 mg (14 mg Transdermal Patch Applied 11/12/17 2152)  vancomycin (VANCOCIN) 1,500 mg in sodium chloride 0.9 % 500 mL IVPB (1,500 mg Intravenous New Bag/Given 11/13/17 0113)  nicotine (NICODERM CQ - dosed in mg/24 hours) patch 21 mg (has no administration in time range)  rosuvastatin (CRESTOR) tablet 20 mg (has no administration in time range)  HYDROcodone-acetaminophen (HYCET) 7.5-325 mg/15 ml solution 5 mL (5 mLs Oral Given 11/13/17 0151)  ondansetron (ZOFRAN) tablet 4 mg (has no administration in time range)    Or  ondansetron (ZOFRAN) injection 4 mg (has no administration in time range)  0.9 %  sodium chloride infusion (has no administration in time range)  acetaminophen (TYLENOL) tablet 650 mg (has no administration in time range)    Or  acetaminophen (TYLENOL) suppository 650 mg (has no administration in time range)  ipratropium-albuterol (DUONEB) 0.5-2.5 (3) MG/3ML nebulizer solution 3 mL (has no administration in time range)  guaiFENesin (MUCINEX) 12 hr tablet 600 mg (600 mg Oral Given 11/13/17 0151)  lisinopril (PRINIVIL,ZESTRIL) tablet 20 mg (  has no administration in time range)  hydrochlorothiazide (HYDRODIURIL) tablet 25 mg (has no administration in time range)  morphine 4 MG/ML injection 4 mg (4 mg Intravenous Given 11/12/17 2149)  prochlorperazine (COMPAZINE) injection 10 mg (10 mg Intravenous Given 11/12/17 2148)  iopamidol (ISOVUE-370) 76 % injection 100 mL (100 mLs Intravenous Contrast Given 11/12/17 2241)  ceFEPIme (MAXIPIME) 1 g in sodium chloride 0.9 %  100 mL IVPB (0 g Intravenous Stopped 11/13/17 0113)    Mobility [place]

## 2017-11-13 NOTE — ED Notes (Signed)
Cup given for sputum sample

## 2017-11-13 NOTE — ED Notes (Signed)
No respiratory or acute distress noted alert and oriented x 3 call light in reach no reaction to medication noted coughing up dark blood at times.

## 2017-11-13 NOTE — Progress Notes (Signed)
Pharmacy Antibiotic Note  Jill [name] is a 57 y.o. female admitted on 11/12/2017 with pneumonia.  Pharmacy has been consulted for Vancomycin, cefepime dosing.  Plan: Cefepime 1gm iv q8hr  Vancomycin 1500mg  iv x1, then 1gm iv q24hr  Goal AUC = 400 - 500 for all indications, except meningitis (goal AUC > 500 and Cmin 15-20 mcg/mL)   Height: 5\' 1"  (154.9 cm) Weight: 180 lb (81.6 kg) IBW/kg (Calculated) : 47.8  Temp (24hrs), Avg:98.2 F (36.8 C), Min:98.2 F (36.8 C), Max:98.2 F (36.8 C)  Recent Labs  Lab 11/08/17 0750 11/12/17 2117 11/12/17 2125  WBC  --  12.4*  --   CREATININE 0.80 0.74  --   LATICACIDVEN  --   --  0.93    Estimated Creatinine Clearance: 76 mL/min (by C-G formula based on SCr of 0.74 mg/dL).    No Known Allergies  Antimicrobials this admission: Vancomycin 11/13/2017 >> Cefepime 11/13/2017 >>   Dose adjustments this admission: -  Microbiology results: -  Thank you for allowing pharmacy to be a part of this patient's care.  Nani Skillern Crowford 11/13/2017 3:00 AM

## 2017-11-14 ENCOUNTER — Inpatient Hospital Stay (HOSPITAL_COMMUNITY): Payer: Medicare Other

## 2017-11-14 ENCOUNTER — Encounter (HOSPITAL_COMMUNITY)
Admission: EM | Disposition: A | Discharge status: Home Health | Payer: Self-pay | Source: Home / Self Care | Attending: Internal Medicine

## 2017-11-14 ENCOUNTER — Ambulatory Visit: Payer: Medicare Other | Admitting: Thoracic Surgery (Cardiothoracic Vascular Surgery)

## 2017-11-14 ENCOUNTER — Ambulatory Visit
Admit: 2017-11-14 | Discharge: 2017-11-14 | Disposition: A | Discharge status: Home / Self Care | Payer: Medicare Other | Attending: Radiation Oncology | Admitting: Radiation Oncology

## 2017-11-14 ENCOUNTER — Encounter: Payer: Self-pay | Admitting: Pulmonary Disease

## 2017-11-14 DIAGNOSIS — C7931 Secondary malignant neoplasm of brain: Secondary | ICD-10-CM

## 2017-11-14 DIAGNOSIS — C3491 Malignant neoplasm of unspecified part of right bronchus or lung: Secondary | ICD-10-CM

## 2017-11-14 HISTORY — PX: VIDEO BRONCHOSCOPY: SHX5072

## 2017-11-14 SURGERY — BRONCHOSCOPY, WITH FLUOROSCOPY
Anesthesia: Moderate Sedation | Laterality: Bilateral

## 2017-11-14 MED ORDER — LIDOCAINE HCL (PF) 1 % IJ SOLN
INTRAMUSCULAR | Status: DC | PRN
  Administered 2017-11-14: 6 mL

## 2017-11-14 MED ORDER — SODIUM CHLORIDE 0.9 % IV SOLN
INTRAVENOUS | Status: DC
  Administered 2017-11-14 – 2017-11-16 (×3): via INTRAVENOUS

## 2017-11-14 MED ORDER — MIDAZOLAM HCL 5 MG/ML IJ SOLN
INTRAMUSCULAR | Status: AC
  Filled 2017-11-14: qty 2

## 2017-11-14 MED ORDER — BUTAMBEN-TETRACAINE-BENZOCAINE 2-2-14 % EX AERO: 1.0000 | INHALATION_SPRAY | Freq: Once | CUTANEOUS | Status: DC

## 2017-11-14 MED ORDER — LIDOCAINE HCL 2 % EX GEL
1.0000 "application " | Freq: Once | CUTANEOUS | Status: DC
  Filled 2017-11-14: qty 5

## 2017-11-14 MED ORDER — FENTANYL CITRATE (PF) 100 MCG/2ML IJ SOLN
INTRAMUSCULAR | Status: DC | PRN
  Administered 2017-11-14 (×2): 50 ug via INTRAVENOUS

## 2017-11-14 MED ORDER — MORPHINE SULFATE (PF) 4 MG/ML IV SOLN
2.0000 mg | INTRAVENOUS | Status: DC | PRN
  Administered 2017-11-14 (×2): 2 mg via INTRAVENOUS
  Filled 2017-11-14 (×3): qty 1

## 2017-11-14 MED ORDER — PHENYLEPHRINE HCL 0.25 % NA SOLN: 1.0000 | Freq: Four times a day (QID) | NASAL | Status: DC | PRN

## 2017-11-14 MED ORDER — MIDAZOLAM HCL 10 MG/2ML IJ SOLN
INTRAMUSCULAR | Status: DC | PRN
  Administered 2017-11-14 (×2): 2 mg via INTRAVENOUS

## 2017-11-14 MED ORDER — DEXAMETHASONE SODIUM PHOSPHATE 4 MG/ML IJ SOLN
4.0000 mg | Freq: Four times a day (QID) | INTRAMUSCULAR | Status: DC
  Administered 2017-11-14 – 2017-11-15 (×5): 4 mg via INTRAVENOUS
  Filled 2017-11-14 (×5): qty 1

## 2017-11-14 MED ORDER — FENTANYL CITRATE (PF) 100 MCG/2ML IJ SOLN
INTRAMUSCULAR | Status: AC
  Filled 2017-11-14: qty 4

## 2017-11-14 NOTE — Op Note (Signed)
[facility] Cardiopulmonary Patient Name: Jill [name] Procedure Date: 11/14/2017 MRN: [id number] Attending MD: Chesley Mires , MD Date of Birth: 1960-11-22 CSN: [id number] Age: 57 Admit Type: Inpatient Ethnicity: Not Hispanic or Latino Procedure:            Bronchoscopy Indications:          Right lower lobe lung mass suspicious for cancer Providers:            Chesley Mires, MD, Cherre Huger RRT, RCP, Ashley Mariner                        RRT,RCP Referring MD:          Medicines:            Fentanyl 100 mcg IV, Midazolam 4 mg IV, Lidocaine 2%                        applied to cords 6 mL Complications:        No immediate complications Estimated Blood Loss: Estimated blood loss: none. Procedure:      Pre-Anesthesia Assessment:      - A History and Physical has been performed. The patient's medications,       allergies and sensitivities have been reviewed.      - The risks and benefits of the procedure and the sedation options and       risks were discussed with the patient. All questions were answered and       informed consent was obtained.      After obtaining informed consent, the bronchoscope was passed under       direct vision. Throughout the procedure, the patient's blood pressure,       pulse, and oxygen saturations were monitored continuously. the [id number]       ([id number]) scope was introduced through the mouth and advanced to the       tracheobronchial tree of both lungs. The procedure was accomplished       without difficulty. The patient tolerated the procedure well. The total       duration of the procedure was 12 minutes. Findings:      The nasopharynx/oropharynx appears normal. The larynx appears normal.       The vocal cords appear normal. The subglottic space is normal. The       trachea is of normal caliber. The carina is sharp. The tracheobronchial       tree of the left lung was examined to at least the first subsegmental       level. Bronchial mucosa  and anatomy in the left lung are normal; there       are no endobronchial lesions, and no secretions.      Right Lung Abnormalities: A completely obstructing mass was found in the       right lower lobe.      Endobronchial biopsies of a mass were performed in the right lower lobe       using a forceps and sent for histopathology examination. Four samples       were obtained. Impression:      - The airway examination of the left lung was normal.      - A mass was found in the right lower lobe. This lesion is likely       malignant.      - An endobronchial biopsy was performed. Moderate Sedation:  Moderate (conscious) sedation was personally administered by the       pulmonologist. The following parameters were monitored: oxygen       saturation, heart rate, blood pressure, and response to care. Total       physician intraservice time was 12 minutes. Recommendation:      - Await test results. Procedure Code(s):      --- Professional ---      [phone], Bronchoscopy, rigid or flexible, including fluoroscopic guidance,       when performed; with bronchial or endobronchial biopsy(s), single or       multiple sites      99152, Moderate sedation services provided by the same physician or       other qualified health care professional performing the diagnostic or       therapeutic service that the sedation supports, requiring the presence       of an independent trained observer to assist in the monitoring of the       patient's level of consciousness and physiological status; initial 15       minutes of intraservice time, patient age 72 years or older Diagnosis Code(s):      --- Professional ---      J98.9, Respiratory disorder, unspecified      R91.8, Other nonspecific abnormal finding of lung field CPT copyright 2017 [employer]. All rights reserved. The codes documented in this report are preliminary and upon coder review may  be revised to meet current compliance  requirements. Chesley Mires, MD Chesley Mires, MD 11/14/2017 1:59:16 PM This report has been signed electronically. Number of Addenda: 0 Scope In: 1:18:40 PM Scope Out: 1:26:38 PM

## 2017-11-14 NOTE — Consult Note (Addendum)
Radiation Oncology         [phone] ________________________________  Name: Jill [name]        MRN: [id number]  Date of Service: 11/14/17         DOB: Jan 31, 1961  JO:ACZYSA, Baldemar Friday., PA-C  No ref. provider found     REFERRING PHYSICIAN: No ref. provider found   DIAGNOSIS: The primary encounter diagnosis was Hemoptysis. Diagnoses of Hypoxia, Shortness of breath, and Community acquired pneumonia, unspecified laterality were also pertinent to this visit.   HISTORY OF PRESENT ILLNESS: Jill [name] is a 57 y.o. female seen at the request of Dr. Roxan Hockey for a probable new lung cancer diagnosis. The patient had a screening CT scan due to her tobacco use and for follow-up after several courses of antibiotic therapy for bronchitis that did not improve her symptoms.  On 10/24/2017 this was performed revealing 1.2 cm low right paratracheal adenopathy, 1.2 cm subcarinal lymph node, 6 mm lymph node at the thoracic inlet on the right, and a 3.8 x 5.4 cm lesion in the right infrahilar region generating mass-effect on the right middle lobe bronchus and obliterating the central airway in the right lower lobe.  1.1 x 1.7 cm central right middle lobe pulmonary nodule was also identified.  She had a 12 mm left adrenal nodule suggesting adenoma.  She was complaining of headaches and an MRI of the brain on 11/08/2017 revealed of 11 mm lobulated enhancing mass with a running T2 hyperintensity with vasogenic edema there may be focal leptomeningeal involvement as well.  The same day she underwent PET scan which revealed a 4.6 x 3.2 cm right hilar mass markedly hypermetabolic with an SUV of 52, right lower lobe mass measuring 3.5 x 3 cm with SUV of 20.7, and 18 x 13 mm right middle lobe nodule without hypermetabolism, and no suspicious changes in the left lung.  There are small nodes in the mediastinum and right hilum with hypermetabolism, and along the subcarinal region 11 mm node had an SUV of 29.8.  Low level  hypermetabolism was also seen in the right axilla, and posterior to the right shoulder.  No disease was noted in the liver or adrenal glands.  Several hypermetabolic osseous lesions including the left second rib, L5 vertebral body, and intertrochanteric region of the left femur were noted.  She presented to the emergency room yesterday complaining of increasing hemoptysis.  She was supposed to be seen in our office on Thursday, and we are seeing her to discuss the options of palliative radiotherapy.  Of note she is scheduled to undergo bronchoscopy today with Dr. Elsworth Soho.   PREVIOUS RADIATION THERAPY: No   PAST MEDICAL HISTORY:  Past Medical History:  Diagnosis Date  . Cancer ([place])   . Hypertension   . Schizophrenia ([place])        PAST SURGICAL HISTORY: Past Surgical History:  Procedure Laterality Date  . HERNIA REPAIR    . KIDNEY SURGERY       FAMILY HISTORY: History reviewed. No pertinent family history.   SOCIAL HISTORY:  reports that she has been smoking cigarettes.  She started smoking about 36 years ago. She has a 72.00 pack-year smoking history. She has never used smokeless tobacco. She reports that she does not drink alcohol or use drugs.   ALLERGIES: Patient has no known allergies.   MEDICATIONS:  Current Facility-Administered Medications  Medication Dose Route Frequency Provider Last Rate Last Dose  . 0.9 %  sodium chloride infusion  Intravenous Continuous Fuller Plan A, MD 75 mL/hr at 11/14/17 0600    . acetaminophen (TYLENOL) tablet 650 mg  650 mg Oral Q6H PRN Fuller Plan A, MD   650 mg at 11/13/17 2342   Or  . acetaminophen (TYLENOL) suppository 650 mg  650 mg Rectal Q6H PRN Fuller Plan A, MD      . ceFEPIme (MAXIPIME) 1 g in sodium chloride 0.9 % 100 mL IVPB  1 g Intravenous Q8H Norval Morton, MD   Stopped at 11/14/17 0615  . guaiFENesin (MUCINEX) 12 hr tablet 600 mg  600 mg Oral BID Norval Morton, MD   Stopped at 11/14/17 0913  . hydrochlorothiazide  (HYDRODIURIL) tablet 25 mg  25 mg Oral Daily Norval Morton, MD   Stopped at 11/14/17 0914  . ipratropium-albuterol (DUONEB) 0.5-2.5 (3) MG/3ML nebulizer solution 3 mL  3 mL Nebulization Q4H PRN Smith, Rondell A, MD      . lisinopril (PRINIVIL,ZESTRIL) tablet 20 mg  20 mg Oral Daily Norval Morton, MD   Stopped at 11/14/17 0914  . morphine 4 MG/ML injection 2 mg  2 mg Intravenous Q3H PRN Gardiner Barefoot, NP   2 mg at 11/14/17 0908  . nicotine (NICODERM CQ - dosed in mg/24 hours) patch 21 mg  21 mg Transdermal Daily Fuller Plan A, MD   21 mg at 11/13/17 2130  . ondansetron (ZOFRAN) tablet 4 mg  4 mg Oral Q6H PRN Fuller Plan A, MD   4 mg at 11/13/17 2342   Or  . ondansetron (ZOFRAN) injection 4 mg  4 mg Intravenous Q6H PRN Smith, Rondell A, MD      . oxyCODONE (Oxy IR/ROXICODONE) immediate release tablet 5 mg  5 mg Oral Q6H PRN Aline August, MD   5 mg at 11/14/17 0220  . rosuvastatin (CRESTOR) tablet 20 mg  20 mg Oral q1800 Fuller Plan A, MD   20 mg at 11/13/17 1727  . vancomycin (VANCOCIN) IVPB 1000 mg/200 mL premix  1,000 mg Intravenous Q24H Norval Morton, MD   Stopped at 11/13/17 2246     REVIEW OF SYSTEMS: On review of systems, the patient reports that she is doing okay but has noticed nausea this am, and headaches for the last month. She has noticed some discomfort along the left ribs low down in the chest. She denies any chest pain, shortness of breath, cough, fevers, chills, night sweats, unintended weight changes. She denies any bowel or bladder disturbances, and denies abdominal pain, nausea or vomiting. She denies any new musculoskeletal or joint aches or pains, and reports her back is stable but sore from positioning. A complete review of systems is obtained and is otherwise negative.     PHYSICAL EXAM:  Wt Readings from Last 3 Encounters:  11/12/17 180 lb (81.6 kg)  10/31/17 180 lb (81.6 kg)   Temp Readings from Last 3 Encounters:  11/14/17 97.7 F (36.5 C)  (Oral)   BP Readings from Last 3 Encounters:  11/14/17 109/64  10/31/17 100/69   Pulse Readings from Last 3 Encounters:  11/13/17 (!) 119  10/31/17 (!) 110   Pain Assessment Pain Score: 8 /10  In general this is a chronically ill appearing caucasian female in no acute distress. She is alert and oriented x4 and appropriate throughout the examination. HEENT reveals that the patient is normocephalic, atraumatic. EOMs are intact. PERRLA. Skin is intact without any evidence of gross lesions. Cardiovascular exam reveals a regular rate and rhythm, no  clicks rubs or murmurs are auscultated. Chest is clear to auscultation of the left, but in the right fields has coars breath sounds at the bases, and rhonchi at the apex. Lymphatic assessment is performed and does not reveal any adenopathy in the cervical, supraclavicular, axillary, or inguinal chains. Abdomen has active bowel sounds in all quadrants and is intact. The abdomen is soft, non tender, non distended. Lower extremities are negative for pretibial pitting edema, deep calf tenderness, cyanosis or clubbing.   ECOG = 4  0 - Asymptomatic (Fully active, able to carry on all predisease activities without restriction)  1 - Symptomatic but completely ambulatory (Restricted in physically strenuous activity but ambulatory and able to carry out work of a light or sedentary nature. For example, light housework, office work)  2 - Symptomatic, <50% in bed during the day (Ambulatory and capable of all self care but unable to carry out any work activities. Up and about more than 50% of waking hours)  3 - Symptomatic, >50% in bed, but not bedbound (Capable of only limited self-care, confined to bed or chair 50% or more of waking hours)  4 - Bedbound (Completely disabled. Cannot carry on any self-care. Totally confined to bed or chair)  5 - Death   Eustace Pen MM, Creech RH, Tormey DC, et al. [phone]). "Toxicity and response criteria of the [facility]". [place] Oncol. 5 (6): 649-55    LABORATORY DATA:  Lab Results  Component Value Date   WBC 9.8 11/13/2017   HGB 12.3 11/13/2017   HCT 38.2 11/13/2017   MCV 88.4 11/13/2017   PLT 199 11/13/2017   Lab Results  Component Value Date   NA 138 11/13/2017   K 3.9 11/13/2017   CL 108 11/13/2017   CO2 21 (L) 11/13/2017   Lab Results  Component Value Date   ALT 18 11/12/2017   AST 20 11/12/2017   ALKPHOS 74 11/12/2017   BILITOT 0.8 11/12/2017      RADIOGRAPHY: Dg Chest 2 View  Result Date: 10/24/2017 CLINICAL DATA:  Chronic cough, congestion, shortness of Breath EXAM: CHEST - 2 VIEW COMPARISON:  12/23/2015 FINDINGS: Consolidation noted in the right lower lobe compatible with pneumonia. Possible early infiltrate in the left lower lobe versus atelectasis. Heart is normal size. No effusions or acute bony abnormality. IMPRESSION: Right lower lobe pneumonia. Early left basilar infiltrate versus atelectasis. Electronically Signed   By: Rolm Baptise M.D.   On: 10/24/2017 09:27   Ct Angio Chest Pe W And/or Wo Contrast  Result Date: 11/12/2017 CLINICAL DATA:  Hemoptysis lung tumor EXAM: CT ANGIOGRAPHY CHEST WITH CONTRAST TECHNIQUE: Multidetector CT imaging of the chest was performed using the standard protocol during bolus administration of intravenous contrast. Multiplanar CT image reconstructions and MIPs were obtained to evaluate the vascular anatomy. CONTRAST:  100 cc Isovue 370 intravenous COMPARISON:  Chest x-ray 10/24/2017, CT chest 10/24/2017, PET-CT 11/08/2017 FINDINGS: Cardiovascular: Significant respiratory motion limits evaluation for pulmonary emboli, mostly within the lower lobes. No acute central embolus is seen. Nonaneurysmal aorta. No dissection. Moderate aortic atherosclerosis. Coronary vascular calcification. Normal heart size. No pericardial effusion Mediastinum/Nodes: Midline trachea. Coarse calcification right lobe of thyroid. Enlarged mediastinal lymph  nodes. Right paratracheal lymph node measures 11 mm. Enlarged right hilar lymph nodes measuring 14 mm. Esophagus within normal limits. Lungs/Pleura: Right infrahilar lung mass measuring approximately 3.5 by 3.1 cm. Right middle lobe lung nodule measuring 18 mm. Mild emphysema. Right lung base mass now confluent with adjacent consolidation  making it difficult to measure. Upper Abdomen: No acute abnormality. Stone in the mid pole of left kidney. Musculoskeletal: Left second rib lesion on PET-CT not well demonstrated. No additional suspicious bony findings Review of the MIP images confirms the above findings. IMPRESSION: 1. Limited study secondary to respiratory motion artifact particularly at the lung bases. No definite acute central PE is seen 2. Similar size and appearance of right infrahilar lung mass. Additional mass in the right lower lobe has now become confluent with consolidation in the right lower lobe, making it difficult to measure. Enlarged mediastinal and right hilar lymph nodes as before. 3. Stable right middle lobe 18 mm lung nodule, this is noted to be PET negative 4. Stone in the left kidney Aortic Atherosclerosis (ICD10-I70.0) and Emphysema (ICD10-J43.9). Electronically Signed   By: Donavan Foil M.D.   On: 11/12/2017 23:46   Mr Jeri Cos BM Contrast  Result Date: 11/08/2017 CLINICAL DATA:  57 year old female undergoing evaluation of suspected stage IIIA primary bronchogenic carcinoma. EXAM: MRI HEAD WITHOUT AND WITH CONTRAST TECHNIQUE: Multiplanar, multiecho pulse sequences of the brain and surrounding structures were obtained without and with intravenous contrast. CONTRAST:  9mL MULTIHANCE GADOBENATE DIMEGLUMINE 529 MG/ML IV SOLN COMPARISON:  PET-CT today reported separately. FINDINGS: Brain: Situated along the left parieto-occipital sulcus in the medial posterior left hemisphere there is an 11 millimeter mildly lobulated enhancing mass with surrounding T2 and FLAIR hyperintensity compatible with  vasogenic edema. See series 12, image 35 and series 7, image 16. Minimal regional mass effect. No associated hemosiderin. There is perhaps mild associated linear leptomeningeal thickening and enhancement (series 12, image 36). No other abnormal enhancing brain lesion is identified. No other abnormal dural thickening or enhancement. No restricted diffusion to suggest acute infarction. No midline shift, ventriculomegaly, extra-axial collection or acute intracranial hemorrhage. Cervicomedullary junction and pituitary are within normal limits. Scattered small nonspecific nonenhancing mostly subcortical cerebral white matter T2 and FLAIR hyperintense foci bilaterally. No cortical encephalomalacia or chronic cerebral blood products identified. Vascular: Major intracranial vascular flow voids are preserved. The major dural venous sinuses are enhancing and appear patent. Skull and upper cervical spine: Normal visible cervical spine and spinal cord. Visualized bone marrow signal is within normal limits. Sinuses/Orbits: Normal orbits soft tissues. Paranasal sinuses are well pneumatized. Other: Mastoid air cells are clear. Visible internal auditory structures appear normal. Scalp and face soft tissues appear negative. IMPRESSION: 1. Positive for early metastatic disease to the brain. There is a solitary 11 mm enhancing mass situated in the left parieto-occipital sulcus with mild regional edema. There may be associated focal leptomeningeal involvement (series 12, image 36). 2. No other metastatic disease or acute intracranial abnormality. Electronically Signed   By: Genevie Ann M.D.   On: 11/08/2017 10:55   Nm Pet Image Initial (pi) Skull Base To Thigh  Result Date: 11/08/2017 CLINICAL DATA:  Initial treatment strategy for right infrahilar mass and mediastinal adenopathy on lung cancer screening chest CT, presumed lung cancer. EXAM: NUCLEAR MEDICINE PET SKULL BASE TO THIGH TECHNIQUE: 9.0 mCi F-18 FDG was injected intravenously.  Full-ring PET imaging was performed from the skull base to thigh after the radiotracer. CT data was obtained and used for attenuation correction and anatomic localization. Fasting blood glucose: 97 mg/dl COMPARISON:  Chest CT 10/24/2017 FINDINGS: Mediastinal blood pool activity: SUV max 2.2 NECK: No hypermetabolic cervical lymph nodes are identified.There are no lesions of the pharyngeal mucosal space. Physiologic activity associated with the muscles of phonation. Incidental CT findings: Bilateral carotid atherosclerosis. CHEST:  4.6 x 3.2 cm right hilar mass (image 79/4) is markedly hypermetabolic with an SUV max of 52. There is a hypermetabolic peripheral right lower lobe mass as well with intervening postobstructive pneumonitis which has mildly improved. This mass measures approximately 3.5 x 3.0 cm on image 47/8 and has an SUV max of 20.7. The 1.8 x 1.3 cm right middle lobe nodule on image 31/8 demonstrates no hypermetabolic activity. There is no suspicious metabolic activity within the left lung. There are small hypermetabolic mediastinal and right hilar lymph nodes. In particular, there is an 11 mm subcarinal node on image 75/4 which has an SUV max of 29.8. There is no contralateral mediastinal or hilar adenopathy. Low level hypermetabolic activity is also demonstrated within a small right axillary node (SUV max 1.4) and superficially in the posterior right shoulder region (SUV max 2.6). Incidental CT findings: Diffuse atherosclerosis of the aorta, great vessels and coronary arteries. No pleural or pericardial effusion. ABDOMEN/PELVIS: There is no hypermetabolic activity within the liver, adrenal glands, spleen or pancreas. There is no hypermetabolic nodal activity. Incidental CT findings: There are small nonobstructing bilateral renal calculi. Also demonstrated are a duodenal diverticulum, a supraumbilical hernia containing only fat and diffuse aortic and branch vessel atherosclerosis. SKELETON: There are  several hypermetabolic osseous lesions consistent with metastatic disease. These include a lesion laterally in the left 2nd rib (SUV max 13.1), a 16 mm with sclerotic margins in the L5 vertebral body (image 140/4, SUV max 22.4), and a small hypermetabolic lesion in the intertrochanteric region of the left femur (SUV max 7.0). There are other smaller lesions. No pathologic fractures are seen. Incidental CT findings: Spondylosis in the lower lumbar spine with mild hypermetabolic activity associated with the L4-5 facet joints. IMPRESSION: 1. The large right lower lobe mass and the associated mediastinal and right hilar adenopathy are hypermetabolic, consistent with lung cancer. In addition, there are several hypermetabolic osseous lesions consistent with metastatic disease. Stage IV disease, [id number]. 2. Slight improvement in postobstructive pneumonitis in the right lower lobe. 3. The right middle lobe nodule does not demonstrate hypermetabolic activity. 4. Incidental findings including bilateral nephrolithiasis and Aortic Atherosclerosis (ICD10-I70.0). Electronically Signed   By: Richardean Sale M.D.   On: 11/08/2017 15:11   Ct Chest Lung Ca Screen Low Dose W/o Cm  Result Date: 10/24/2017 CLINICAL DATA:  57 year old female with 35 pack year history of smoking. Lung cancer screening. EXAM: CT CHEST WITHOUT CONTRAST LOW-DOSE FOR LUNG CANCER SCREENING TECHNIQUE: Multidetector CT imaging of the chest was performed following the standard protocol without IV contrast. COMPARISON:  None. FINDINGS: Cardiovascular: The heart size is normal. No pericardial effusion. Coronary artery calcification is evident. Atherosclerotic calcification is noted in the wall of the thoracic aorta. Ascending thoracic aorta Mediastinum/Nodes: Low right paratracheal lymph node measures 12 mm short axis. 12 mm short axis subcarinal lymph node evident. 6 mm short axis lymph node identified in the right thoracic inlet (image 10/series 2). There is  no axillary lymphadenopathy. The esophagus has normal imaging features. Lungs/Pleura: 3.8 x 5.4 cm lesion identified in the right infrahilar region, generating substantial mass-effect on the right middle lobe bronchus and obliterating central airway to the right lower lobe. 1.1 x 1.7 cm central right middle lobe pulmonary nodule is identified on image 157 of series 3. There is confluent airspace opacity in the posterior right lung which may be neoplastic or postobstructive in etiology. Centrilobular emphysema evident. Atelectasis/scarring noted in the right middle lobe and lingula. Calcified granuloma identified in the left  lower lobe near the major fissure. Upper Abdomen: 12 mm left adrenal nodule has an average attenuation of 1 Hounsfield units suggesting adenoma. Right adrenal gland unremarkable. Nonobstructing renal stones are evident bilaterally. Musculoskeletal: Bone windows reveal no worrisome lytic or sclerotic osseous lesions. IMPRESSION: 1. 5.4 cm right infrahilar mass obliterates right lower lobe bronchus and attenuates the right middle lobe airway. This is associated with mediastinal lymphadenopathy and a 1.7 cm nodule in the central right middle lobe concerning for metastatic involvement. Confluent airspace opacity in the posterior right lower lobe may be neoplastic and/or postobstructive. Lung-RADS 4B, highly suspicious for primary neoplasm with metastatic disease. Additional imaging evaluation or consultation with Pulmonology or Thoracic Surgery recommended. These results will be called to the ordering clinician or representative by the Radiologist Assistant, and communication documented in the PACS or zVision Dashboard. Electronically Signed   By: Misty Stanley M.D.   On: 10/24/2017 16:48       IMPRESSION/PLAN: 1. 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 I reviewed her imaging findings and the stigmata of lung cancer. Given her symptomatology she will undergo bronchoscopy  for diagnosis, and we would recommend palliative radiotherapy to the chest and brain. Dr. Lisbeth Renshaw will also re-review her films to determine treatment for any of her bone disease. We discussed the risks, benefits, short, and long term effects of radiotherapy, and the patient is interested in proceeding. Dr. Lisbeth Renshaw discusses the delivery and logistics of radiotherapy and anticipates a course of 2 weeks of radiotherapy to the chest, and either posterior fossa +SRS to the brain versus whole brain treatment. She will simulate tomorrow and once we confirm diagnosis begin treatment on Thursday.    In a visit lasting 70 minutes, greater than 50% of the time was spent face to face discussing her case, and in floor time coordinating the patient's care.     Carola Rhine, PAC

## 2017-11-14 NOTE — Progress Notes (Signed)
Video bronchoscopy performed Intervention bronchial biopsies Pt tolerated well  Kathie Dike RRT

## 2017-11-14 NOTE — Progress Notes (Signed)
Name: Jill [name] MRN: [id number] DOB: 12/06/1960    ADMISSION DATE:  11/12/2017 CONSULTATION DATE:  5/13  REFERRING MD :  Holley Raring  CHIEF COMPLAINT: Hemoptysis and lung mass  History of present illness This is a 57 year old smoker who was actually seen in consultation by Dr. Roxan Hockey on 4/30 for a new right lung mass who had initiated evaluation including: MRI brain as well as PET scan (see studies below) with the studies both suggesting metastasis to the brain, lumbar spine, left second rib, and left femur.  She was to follow-up these tests the week of 5/13.  On 5/12 she began to acutely feel ill.  She developed relatively sudden chest congestion followed by hemoptysis.  She had some chest discomfort/pain associated with this.  She reported the volume of hemoptysis estimated approximately 1 tablespoon at a time with dark bloody mucus, with a total volume of approximately 200 mL.  This is since slowed down in quantity and now has streaky hemoptysis barely covering the bottom of a thyroid phone, since midnight 5/13.  She has had no fever, recent sick exposure, but has had poor appetite, weight loss, and worsening activity levels.  She also has had significant discomfort this is included headache behind her right temple, photosensitivity, and generalized discomfort.  She is been working on cutting down smoking down to 2 cigarettes a day she has a nicotine patch as well as nicotine lozenges.  As part of her evaluation she had a repeat CT of chest showed right infrahilar lung mass, right lower lobe lung mass confluent with consolidation in the right lower lobe.  Also had mediastinal lymph nodes and hilar lymph nodes as present.  Pulmonary was asked to see given concern of malignancy and to assist with diagnostic evaluation.  SIGNIFICANT EVENTS    STUDIES:  MRI brain 5/8: Early metastasis in the brain with a solitary lesion of 11 mm in the left parietal occipital sulcus with mild associated  regional edema  PET scan 5/8: 1. The large right lower lobe mass and the associated mediastinal and right hilar adenopathy are hypermetabolic, consistent with lung cancer. In addition, there are several hypermetabolic osseous lesions consistent with metastatic disease. Stage IV disease, T2aN2M1b. 2. Slight improvement in postobstructive pneumonitis in the right lower lobe. 3. The right middle lobe nodule does not demonstrate hypermetabolic activity. 4. Incidental findings including bilateral nephrolithiasis and Aortic Atherosclerosis (ICD10-I70.0). Chest CT 5/12:1. Limited study secondary to respiratory motion artifact particularly at the lung bases. No definite acute central PE is seen 2. Similar size and appearance of right infrahilar lung mass. Additional mass in the right lower lobe has now become confluent with consolidation in the right lower lobe, making it difficult to measure. Enlarged mediastinal and right hilar lymph nodes as before. 3. Stable right middle lobe 18 mm lung nodule, this is noted to be PET negative 4. Stone in the left kidney  SUBJECTIVE/interval:  Vomited this morning.  This is following thick mucus and cough.  Sounds like she is gagging on mucus.  No further blood.  Not more short of breath.  No new complaints.  She is awaiting bronchoscopy for later today VITAL SIGNS: Temp:  [97.7 F (36.5 C)-99.2 F (37.3 C)] 97.7 F (36.5 C) (05/14 0800) Pulse Rate:  [113-123] 119 (05/13 1435) Resp:  [18-35] 31 (05/14 0800) BP: (74-131)/(39-93) 109/64 (05/14 0800) SpO2:  [86 %-96 %] 93 % (05/14 0800)  PHYSICAL EXAMINATION: General: 57 year old white female resting comfortably in bed she is in  no acute distress Neuro: Awake alert oriented no focal deficits HEENT: Normocephalic atraumatic no jugular venous distention Pulmonary: Scattered rhonchi, rhonchorous cough, diminished right base.  No accessory use. Cardiac: Regular rate and rhythm without murmur rub or  gallop Abdomen: Soft nontender no organomegaly Extremities: Brisk capillary refill no significant edema. GU: Clear yellow  Recent Labs  Lab 11/08/17 0750 11/12/17 2117 11/13/17 0550  NA  --  139 138  K  --  4.0 3.9  CL  --  104 108  CO2  --  22 21*  BUN  --  21* 14  CREATININE 0.80 0.74 0.60  GLUCOSE  --  97 100*   Recent Labs  Lab 11/12/17 2117 11/13/17 0550  HGB 13.9 12.3  HCT 43.1 38.2  WBC 12.4* 9.8  PLT 240 199   Ct Angio Chest Pe W And/or Wo Contrast  Result Date: 11/12/2017 CLINICAL DATA:  Hemoptysis lung tumor EXAM: CT ANGIOGRAPHY CHEST WITH CONTRAST TECHNIQUE: Multidetector CT imaging of the chest was performed using the standard protocol during bolus administration of intravenous contrast. Multiplanar CT image reconstructions and MIPs were obtained to evaluate the vascular anatomy. CONTRAST:  100 cc Isovue 370 intravenous COMPARISON:  Chest x-ray 10/24/2017, CT chest 10/24/2017, PET-CT 11/08/2017 FINDINGS: Cardiovascular: Significant respiratory motion limits evaluation for pulmonary emboli, mostly within the lower lobes. No acute central embolus is seen. Nonaneurysmal aorta. No dissection. Moderate aortic atherosclerosis. Coronary vascular calcification. Normal heart size. No pericardial effusion Mediastinum/Nodes: Midline trachea. Coarse calcification right lobe of thyroid. Enlarged mediastinal lymph nodes. Right paratracheal lymph node measures 11 mm. Enlarged right hilar lymph nodes measuring 14 mm. Esophagus within normal limits. Lungs/Pleura: Right infrahilar lung mass measuring approximately 3.5 by 3.1 cm. Right middle lobe lung nodule measuring 18 mm. Mild emphysema. Right lung base mass now confluent with adjacent consolidation making it difficult to measure. Upper Abdomen: No acute abnormality. Stone in the mid pole of left kidney. Musculoskeletal: Left second rib lesion on PET-CT not well demonstrated. No additional suspicious bony findings Review of the MIP images  confirms the above findings. IMPRESSION: 1. Limited study secondary to respiratory motion artifact particularly at the lung bases. No definite acute central PE is seen 2. Similar size and appearance of right infrahilar lung mass. Additional mass in the right lower lobe has now become confluent with consolidation in the right lower lobe, making it difficult to measure. Enlarged mediastinal and right hilar lymph nodes as before. 3. Stable right middle lobe 18 mm lung nodule, this is noted to be PET negative 4. Stone in the left kidney Aortic Atherosclerosis (ICD10-I70.0) and Emphysema (ICD10-J43.9). Electronically Signed   By: Donavan Foil M.D.   On: 11/12/2017 23:46    ASSESSMENT / PLAN: Hemoptysis Right hilar and lower lobe lung mass Probable metastatic lesions involving: Brain, lumbar spine, left second rib and left femur. Postobstructive atelectasis Tobacco abuse Bipolar disease on intramuscular Haldol  Discussion Hemoptysis in the setting of right hilar and right lower lobe lung mass with associated postobstructive atelectasis as well as marked areas of what appears to be metastatic lesions involving the brain, lumbar spine, left second rib and left femur.  Given hemoptysis, certainly has endobronchial lesion.  Luckily no further hemoptysis overnight.  She is pending bronchoscopy today for tissue biopsy.  She was seen by radiation oncology last night, with plans for urgent consult today. Plan We will plan on bronchoscopy today at 1:00, hopefully we can obtain tissue identity to help expedite treatment of what appears to be  a stage IV cancer   Erick Colace ACNP-BC [place] Pager # [phone] OR # [phone] if no answer   11/14/2017, 9:24 AM

## 2017-11-14 NOTE — Progress Notes (Addendum)
Patient ID: Jill [name], female   DOB: [birth year], 57 y.o.   MRN: [id number]  PROGRESS NOTE    Jill [name]  XNA:[id number] DOB: April 17, 1961 DOA: 11/12/2017 PCP: Aletha Halim., PA-C   Brief Narrative:  57 year old female with history of recent diagnosis of metastatic lung cancer on imaging without tissue diagnosis yet, hypertension, hyperlipidemia, schizophrenia and tobacco abuse presented with coughing up blood.  CT his chest showed right-sided lung mass with worsening confluent consolidation.  She was started on broad-spectrum intravenous antibiotics.  Pulmonary was consulted.   Assessment & Plan:   Principal Problem:   Sepsis due to pneumonia [facility]) Active Problems:   Schizophrenia ([place])   TOBACCO DEPENDENCE   HYPERTENSION, BENIGN SYSTEMIC   Hemoptysis   Hypoxia   Metastatic lung cancer (metastasis from lung to other site), right ([place])   Headache   Sepsis secondary to pneumonia -Hemodynamically improving.  Cultures negative so far.  Antibiotic plan as below.  Discontinue IV fluids  Probable right-sided postobstructive pneumonia -Continue vancomycin and cefepime.  Follow cultures -Oxygen supplementation if needed  Hemoptysis -Probably secondary to lung mass and pneumonia.  Hemoglobin stable.  Avoid Lovenox  Leukocytosis -Probably secondary to above.  Resolved  Hypoxia -Probably secondary to above.  Improved.  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  Radiation oncology is already following the patient -Continue pain management -Spoke to Dr. Magrinat/Oncology who will arrange for patient to be followed up by Dr. Julien Nordmann once pathology results are available.  Headache -May be related to brain metastases -Continue symptomatic treatment  Schizophrenia -Patient takes monthly Haldol injections  Tobacco abuse -Patient  was counseled regarding tobacco cessation    DVT prophylaxis: SCDs Code Status: Full Family Communication: None at bedside Disposition Plan: Depends on clinical outcome  Consultants: Pulmonary/radiation oncology Spoke to Dr. Magrinat/Oncology on phone  Procedures: None  Antimicrobials: None   Subjective: Patient seen and examined at bedside.  She still coughing intermittently with some blood in it.  Complains of intermittent headache.  No overnight fever, nausea or vomiting.  Objective: Vitals:   11/14/17 0400 11/14/17 0500 11/14/17 0600 11/14/17 0800  BP: 123/68   109/64  Pulse:      Resp: (!) 32 (!) 22 (!) 26 (!) 31  Temp: 97.9 F (36.6 C)   97.7 F (36.5 C)  TempSrc: Oral   Oral  SpO2: 90% 95% (!) 87% 93%  Weight:      Height:        Intake/Output Summary (Last 24 hours) at 11/14/2017 1014 Last data filed at 11/14/2017 0600 Gross per 24 hour  Intake 2205 ml  Output -  Net 2205 ml   Filed Weights   11/12/17 2025  Weight: 81.6 kg (180 lb)    Examination:  General exam: Looks older than stated age.  Mild distress secondary to cough  Respiratory system: Bilateral decreased breath sound at bases with some scattered crackles Cardiovascular system: S1 & S2 heard, rate controlled  gastrointestinal system: Abdomen is nondistended, soft and nontender. Normal bowel sounds heard. Extremities: No cyanosis, clubbing, edema    Data Reviewed: I have personally reviewed following labs and imaging studies  CBC: Recent Labs  Lab 11/12/17 2117 11/13/17 0550  WBC 12.4* 9.8  HGB 13.9 12.3  HCT 43.1 38.2  MCV 88.3 88.4  PLT 240 542   Basic Metabolic Panel: Recent Labs  Lab 11/08/17 0750  11/12/17 2117 11/13/17 0550  NA  --  139 138  K  --  4.0 3.9  CL  --  104 108  CO2  --  22 21*  GLUCOSE  --  97 100*  BUN  --  21* 14  CREATININE 0.80 0.74 0.60  CALCIUM  --  10.1 9.6   GFR: Estimated Creatinine Clearance: 76 mL/min (by C-G formula based on SCr of 0.6  mg/dL). Liver Function Tests: Recent Labs  Lab 11/12/17 2117  AST 20  ALT 18  ALKPHOS 74  BILITOT 0.8  PROT 7.4  ALBUMIN 4.2   Recent Labs  Lab 11/12/17 2116  LIPASE 45   No results for input(s): AMMONIA in the last 168 hours. Coagulation Profile: Recent Labs  Lab 11/12/17 2116  INR 0.98   Cardiac Enzymes: No results for input(s): CKTOTAL, CKMB, CKMBINDEX, TROPONINI in the last 168 hours. BNP (last 3 results) No results for input(s): PROBNP in the last 8760 hours. HbA1C: No results for input(s): HGBA1C in the last 72 hours. CBG: Recent Labs  Lab 11/08/17 0917  GLUCAP 97   Lipid Profile: No results for input(s): CHOL, HDL, LDLCALC, TRIG, CHOLHDL, LDLDIRECT in the last 72 hours. Thyroid Function Tests: No results for input(s): TSH, T4TOTAL, FREET4, T3FREE, THYROIDAB in the last 72 hours. Anemia Panel: No results for input(s): VITAMINB12, FOLATE, FERRITIN, TIBC, IRON, RETICCTPCT in the last 72 hours. Sepsis Labs: Recent Labs  Lab 11/12/17 2116 11/12/17 2125  PROCALCITON <0.10  --   LATICACIDVEN  --  0.93    Recent Results (from the past 240 hour(s))  Blood culture (routine x 2)     Status: None (Preliminary result)   Collection Time: 11/12/17  9:18 PM  Result Value Ref Range Status   Specimen Description   Final    BLOOD RIGHT ANTECUBITAL Performed at [facility] [address] [place], [place] [address]    Special Requests   Final    BOTTLES DRAWN AEROBIC AND ANAEROBIC Blood Culture results may not be optimal due to an excessive volume of blood received in culture bottles Performed at [facility] [address], [place], [place] [address]    Culture   Final    NO GROWTH 1 DAY Performed at [facility], [place] [address] [place], [place] [address]    Report Status PENDING  Incomplete  Blood culture (routine x 2)     Status: None (Preliminary result)   Collection Time: 11/12/17 10:35 PM  Result Value Ref  Range Status   Specimen Description   Final    BLOOD LEFT WRIST Performed at [facility], [place] [address], [place], [place] [address]    Special Requests   Final    BOTTLES DRAWN AEROBIC AND ANAEROBIC Blood Culture adequate volume Performed at [facility] [address], [place], [place] [address]    Culture   Final    NO GROWTH 1 DAY Performed at [facility], [place] [address], [place], [place] [address]    Report Status PENDING  Incomplete  MRSA PCR Screening     Status: None   Collection Time: 11/13/17  4:07 PM  Result Value Ref Range Status   MRSA by PCR NEGATIVE NEGATIVE Final    Comment:        The GeneXpert MRSA Assay (FDA approved for NASAL specimens only), is one component of a comprehensive MRSA colonization surveillance program. It is not intended to  diagnose MRSA infection nor to guide or monitor treatment for MRSA infections. Performed at [facility], [place] [address], [place], [place] [address]          Radiology Studies: Ct Angio Chest Pe W And/or Wo Contrast  Result Date: 11/12/2017 CLINICAL DATA:  Hemoptysis lung tumor EXAM: CT ANGIOGRAPHY CHEST WITH CONTRAST TECHNIQUE: Multidetector CT imaging of the chest was performed using the standard protocol during bolus administration of intravenous contrast. Multiplanar CT image reconstructions and MIPs were obtained to evaluate the vascular anatomy. CONTRAST:  100 cc Isovue 370 intravenous COMPARISON:  Chest x-ray 10/24/2017, CT chest 10/24/2017, PET-CT 11/08/2017 FINDINGS: Cardiovascular: Significant respiratory motion limits evaluation for pulmonary emboli, mostly within the lower lobes. No acute central embolus is seen. Nonaneurysmal aorta. No dissection. Moderate aortic atherosclerosis. Coronary vascular calcification. Normal heart size. No pericardial effusion Mediastinum/Nodes: Midline trachea. Coarse calcification right lobe of thyroid. Enlarged mediastinal lymph  nodes. Right paratracheal lymph node measures 11 mm. Enlarged right hilar lymph nodes measuring 14 mm. Esophagus within normal limits. Lungs/Pleura: Right infrahilar lung mass measuring approximately 3.5 by 3.1 cm. Right middle lobe lung nodule measuring 18 mm. Mild emphysema. Right lung base mass now confluent with adjacent consolidation making it difficult to measure. Upper Abdomen: No acute abnormality. Stone in the mid pole of left kidney. Musculoskeletal: Left second rib lesion on PET-CT not well demonstrated. No additional suspicious bony findings Review of the MIP images confirms the above findings. IMPRESSION: 1. Limited study secondary to respiratory motion artifact particularly at the lung bases. No definite acute central PE is seen 2. Similar size and appearance of right infrahilar lung mass. Additional mass in the right lower lobe has now become confluent with consolidation in the right lower lobe, making it difficult to measure. Enlarged mediastinal and right hilar lymph nodes as before. 3. Stable right middle lobe 18 mm lung nodule, this is noted to be PET negative 4. Stone in the left kidney Aortic Atherosclerosis (ICD10-I70.0) and Emphysema (ICD10-J43.9). Electronically Signed   By: Donavan Foil M.D.   On: 11/12/2017 23:46        Scheduled Meds: . guaiFENesin  600 mg Oral BID  . hydrochlorothiazide  25 mg Oral Daily  . lisinopril  20 mg Oral Daily  . nicotine  21 mg Transdermal Daily  . rosuvastatin  20 mg Oral q1800   Continuous Infusions: . sodium chloride 75 mL/hr at 11/14/17 0600  . ceFEPime (MAXIPIME) IV Stopped (11/14/17 0615)  . vancomycin Stopped (11/13/17 2246)     LOS: 1 day        Aline August, MD [facility] Pager [phone]  If 7PM-7AM, please contact night-coverage [email or web address] Password [email or web address] 11/14/2017, 10:14 AM

## 2017-11-15 ENCOUNTER — Encounter (HOSPITAL_COMMUNITY): Payer: Self-pay | Admitting: Pulmonary Disease

## 2017-11-15 ENCOUNTER — Ambulatory Visit
Admit: 2017-11-15 | Discharge: 2017-11-15 | Disposition: A | Discharge status: Home / Self Care | Payer: Medicare Other | Source: Ambulatory Visit | Attending: Radiation Oncology | Admitting: Radiation Oncology

## 2017-11-15 ENCOUNTER — Ambulatory Visit: Payer: Medicare Other

## 2017-11-15 ENCOUNTER — Other Ambulatory Visit: Payer: Self-pay | Admitting: Radiation Therapy

## 2017-11-15 ENCOUNTER — Telehealth: Payer: Self-pay | Admitting: Radiation Oncology

## 2017-11-15 ENCOUNTER — Ambulatory Visit: Payer: Medicare Other | Admitting: Radiation Oncology

## 2017-11-15 DIAGNOSIS — C7931 Secondary malignant neoplasm of brain: Secondary | ICD-10-CM

## 2017-11-15 DIAGNOSIS — J189 Pneumonia, unspecified organism: Secondary | ICD-10-CM

## 2017-11-15 DIAGNOSIS — C3431 Malignant neoplasm of lower lobe, right bronchus or lung: Secondary | ICD-10-CM

## 2017-11-15 DIAGNOSIS — Z510 Encounter for antineoplastic radiation therapy: Secondary | ICD-10-CM | POA: Insufficient documentation

## 2017-11-15 DIAGNOSIS — C7951 Secondary malignant neoplasm of bone: Secondary | ICD-10-CM

## 2017-11-15 DIAGNOSIS — I10 Essential (primary) hypertension: Secondary | ICD-10-CM

## 2017-11-15 DIAGNOSIS — A419 Sepsis, unspecified organism: Principal | ICD-10-CM

## 2017-11-15 DIAGNOSIS — C7949 Secondary malignant neoplasm of other parts of nervous system: Principal | ICD-10-CM

## 2017-11-15 DIAGNOSIS — R51 Headache: Secondary | ICD-10-CM

## 2017-11-15 DIAGNOSIS — F17210 Nicotine dependence, cigarettes, uncomplicated: Secondary | ICD-10-CM

## 2017-11-15 DIAGNOSIS — F209 Schizophrenia, unspecified: Secondary | ICD-10-CM

## 2017-11-15 MED ORDER — SENNOSIDES-DOCUSATE SODIUM 8.6-50 MG PO TABS
1.0000 | ORAL_TABLET | Freq: Two times a day (BID) | ORAL | Status: DC
  Administered 2017-11-15 – 2017-11-18 (×7): 1 via ORAL
  Filled 2017-11-15 (×8): qty 1

## 2017-11-15 MED ORDER — POLYETHYLENE GLYCOL 3350 17 G PO PACK
17.0000 g | PACK | Freq: Every day | ORAL | Status: DC
  Administered 2017-11-15: 17 g via ORAL
  Filled 2017-11-15: qty 1

## 2017-11-15 MED ORDER — DIPHENHYDRAMINE HCL 50 MG/ML IJ SOLN
12.5000 mg | Freq: Once | INTRAMUSCULAR | Status: AC
  Administered 2017-11-15: 12.5 mg via INTRAVENOUS
  Filled 2017-11-15: qty 1

## 2017-11-15 MED ORDER — KETOROLAC TROMETHAMINE 15 MG/ML IJ SOLN
7.5000 mg | Freq: Once | INTRAMUSCULAR | Status: AC
  Administered 2017-11-15: 7.5 mg via INTRAVENOUS
  Filled 2017-11-15: qty 1

## 2017-11-15 MED ORDER — METOCLOPRAMIDE HCL 5 MG/ML IJ SOLN
5.0000 mg | Freq: Once | INTRAMUSCULAR | Status: AC
  Administered 2017-11-15: 5 mg via INTRAVENOUS
  Filled 2017-11-15: qty 2

## 2017-11-15 MED ORDER — DEXAMETHASONE SODIUM PHOSPHATE 4 MG/ML IJ SOLN
2.0000 mg | Freq: Two times a day (BID) | INTRAMUSCULAR | Status: DC
  Administered 2017-11-15 – 2017-11-18 (×6): 2 mg via INTRAVENOUS
  Filled 2017-11-15 (×6): qty 1

## 2017-11-15 MED ORDER — HALOPERIDOL DECANOATE 100 MG/ML IM SOLN
25.0000 mg | INTRAMUSCULAR | Status: DC
  Administered 2017-11-15: 25 mg via INTRAMUSCULAR
  Filled 2017-11-15: qty 0.25

## 2017-11-15 NOTE — Telephone Encounter (Signed)
LM for pt's brother Clare Gandy at request of pt to outline treatment recommendations.

## 2017-11-15 NOTE — Consult Note (Signed)
[place] [phone]   [phone]  CONSULT NOTE  REFERRING PHYSICIAN: Dr. Chesley Mires  REASON FOR CONSULTATION:  57 years old white female with lung cancer.  HPI Jill [name] is a 57 y.o. female with past medical history significant for hypertension, schizophrenia as well as long history for smoking.  The patient mentioned that since September 2018 she has been complaining of cough and chest congestion.  She was seen by her primary care physician several times increased with a course of antibiotics and cough medication.  Her condition was getting worse and she had chest x-ray performed on 10/24/2017 and that showed right lower lobe pneumonia.  She also had CT screening scan of the chest on 10/24/2017 and that showed 3.8 x 5.4 cm lesion identified in the right infrahilar region generating substantial mass-effect on the right middle lobe bronchus and obliterating central airway to the right lower lobe.  There was also a 1.1 x 1.7 cm central right middle lobe pulmonary nodule.  The scan also showed low right paratracheal lymph node measuring 1.2 cm, 1.2 cm subcarinal lymph node, 0.6 cm right thoracic inlet lymph node.  The patient was referred to Dr. Roxan Hockey for further evaluation and he ordered a PET scan as well as MRI of the brain.  The MRI of the brain performed on 11/08/2017 was positive for early metastatic disease to the brain with a solitary 1.1 cm enhancing mass situated in the left parietal-occipital sulcus with mild regional edema.  PET scan on the same day showed the right lower lobe mass and the associated mediastinal and right hilar adenopathy were hypermetabolic consistent with lung cancer.  In addition there are several hypermetabolic osseous lesions consistent with metastatic disease involving a lesion laterally in the left second rib, 1.6 cm sclerotic lesion in the L5 vertebral body and a small hypermetabolic lesion in the intertrochanteric  region of the left femur as well as other smaller lesions.  On 11/13/2017 the patient was admitted to [facility] with coughing blood started the day before.  She mentioned that she was gagging and the blood kept coming.  She is not 100% sure if she was coughing or vomiting it.  She was seen by Dr. Halford Chessman and she underwent bronchoscopy on 11/14/2017.  The preliminary pathology is consistent with non-small cell carcinoma probably adenocarcinoma but further immunohistochemical stains are still pending.  The patient was also seen by Dr. Lisbeth Renshaw and consider for palliative radiotherapy to the brain lesion as well as bone lesion and the right lower lobe lung mass. I was consulted to see the patient and discussed with her her treatment options. When seen today she is feeling fine with no specific complaints except for headache behind her right eye.  She denied having any chest pain but continues to have shortness of breath with exertion with cough and no current hemoptysis.  She denied having any nausea, vomiting, diarrhea or constipation.  She has no fever or chills.  She has no recent weight loss or night sweats. Family history significant for mother with breast cancer. The patient is single and had one child who is deceased.  She is currently on disability.  She has a history for smoking up to 3 packs/day for the last 35 years.  She is trying to quit and using NicoDerm patch.  She has no history of alcohol or drug abuse.  HPI  Past Medical History:  Diagnosis Date  . Cancer ([place])   .  Hypertension   . Schizophrenia [facility])     Past Surgical History:  Procedure Laterality Date  . HERNIA REPAIR    . KIDNEY SURGERY    . VIDEO BRONCHOSCOPY Bilateral 11/14/2017   Procedure: VIDEO BRONCHOSCOPY WITH FLUORO;  Surgeon: Chesley Mires, MD;  Location: [facility];  Service: Endoscopy;  Laterality: Bilateral;    History reviewed. No pertinent family history.  Social History Social History   Tobacco Use    . Smoking status: Current Every Day Smoker    Packs/day: 2.00    Years: 36.00    Pack years: 72.00    Types: Cigarettes    Start date: 10/31/1981  . Smokeless tobacco: Never Used  . Tobacco comment: HAS CALLED QUIT 4 FREE ALREADY  Substance Use Topics  . Alcohol use: Never    Frequency: Never  . Drug use: Never    No Known Allergies  Current Facility-Administered Medications  Medication Dose Route Frequency Provider Last Rate Last Dose  . 0.9 %  sodium chloride infusion   Intravenous Continuous Chesley Mires, MD 10 mL/hr at 11/14/17 2334    . acetaminophen (TYLENOL) tablet 650 mg  650 mg Oral Q6H PRN Fuller Plan A, MD   650 mg at 11/15/17 0850   Or  . acetaminophen (TYLENOL) suppository 650 mg  650 mg Rectal Q6H PRN Fuller Plan A, MD      . ceFEPIme (MAXIPIME) 1 g in sodium chloride 0.9 % 100 mL IVPB  1 g Intravenous Q8H Smith, Rondell A, MD 200 mL/hr at 11/15/17 1411 1 g at 11/15/17 1411  . dexamethasone (DECADRON) injection 4 mg  4 mg Intravenous Q6H Chesley Mires, MD   4 mg at 11/15/17 1146  . guaiFENesin (MUCINEX) 12 hr tablet 600 mg  600 mg Oral BID Tamala Julian, Rondell A, MD   600 mg at 11/15/17 0900  . haloperidol decanoate (HALDOL DECANOATE) 100 MG/ML injection 25 mg  25 mg Intramuscular Q30 days Hall, Carole N, DO      . hydrochlorothiazide (HYDRODIURIL) tablet 25 mg  25 mg Oral Daily Smith, Rondell A, MD   25 mg at 11/15/17 0900  . ipratropium-albuterol (DUONEB) 0.5-2.5 (3) MG/3ML nebulizer solution 3 mL  3 mL Nebulization Q4H PRN Smith, Rondell A, MD      . lisinopril (PRINIVIL,ZESTRIL) tablet 20 mg  20 mg Oral Daily Smith, Rondell A, MD   20 mg at 11/15/17 0900  . morphine 4 MG/ML injection 2 mg  2 mg Intravenous Q3H PRN Gardiner Barefoot, NP   2 mg at 11/14/17 0908  . nicotine (NICODERM CQ - dosed in mg/24 hours) patch 21 mg  21 mg Transdermal Daily Fuller Plan A, MD   21 mg at 11/14/17 2107  . ondansetron (ZOFRAN) tablet 4 mg  4 mg Oral Q6H PRN Fuller Plan A, MD    4 mg at 11/13/17 2342   Or  . ondansetron (ZOFRAN) injection 4 mg  4 mg Intravenous Q6H PRN Fuller Plan A, MD   4 mg at 11/14/17 2037  . oxyCODONE (Oxy IR/ROXICODONE) immediate release tablet 5 mg  5 mg Oral Q6H PRN Aline August, MD   5 mg at 11/15/17 0132  . polyethylene glycol (MIRALAX / GLYCOLAX) packet 17 g  17 g Oral Daily Hall, Carole N, DO   17 g at 11/15/17 0900  . rosuvastatin (CRESTOR) tablet 20 mg  20 mg Oral q1800 Fuller Plan A, MD   20 mg at 11/14/17 1724  . senna-docusate (Senokot-S) tablet 1  tablet  1 tablet Oral BID Kayleen Memos, DO   1 tablet at 11/15/17 8546    Review of Systems  Constitutional: positive for fatigue Eyes: negative Ears, nose, mouth, throat, and face: negative Respiratory: positive for cough, dyspnea on exertion and hemoptysis Cardiovascular: negative Gastrointestinal: negative Genitourinary:negative Integument/breast: negative Hematologic/lymphatic: negative Musculoskeletal:negative Neurological: positive for headaches Behavioral/Psych: negative Endocrine: negative Allergic/Immunologic: negative  Physical Exam  EVO:JJKKX, healthy, no distress, well nourished, well developed and anxious SKIN: skin color, texture, turgor are normal, no rashes or significant lesions HEAD: Normocephalic, No masses, lesions, tenderness or abnormalities EYES: normal, PERRLA, Conjunctiva are pink and non-injected EARS: External ears normal, Canals clear OROPHARYNX:no exudate, no erythema and lips, buccal mucosa, and tongue normal  NECK: supple, no adenopathy, no JVD LYMPH:  no palpable lymphadenopathy, no hepatosplenomegaly BREAST:not examined LUNGS: clear to auscultation , and palpation HEART: regular rate & rhythm, no murmurs and no gallops ABDOMEN:abdomen soft, non-tender, obese, normal bowel sounds and no masses or organomegaly BACK: No CVA tenderness, Range of motion is normal EXTREMITIES:no joint deformities, effusion, or inflammation, no edema   NEURO: alert & oriented x 3 with fluent speech, no focal motor/sensory deficits  PERFORMANCE STATUS: ECOG 1  LABORATORY DATA: Lab Results  Component Value Date   WBC 9.8 11/13/2017   HGB 12.3 11/13/2017   HCT 38.2 11/13/2017   MCV 88.4 11/13/2017   PLT 199 11/13/2017    @LASTCHEM @  RADIOGRAPHIC STUDIES: Dg Chest 2 View  Result Date: 10/24/2017 CLINICAL DATA:  Chronic cough, congestion, shortness of Breath EXAM: CHEST - 2 VIEW COMPARISON:  12/23/2015 FINDINGS: Consolidation noted in the right lower lobe compatible with pneumonia. Possible early infiltrate in the left lower lobe versus atelectasis. Heart is normal size. No effusions or acute bony abnormality. IMPRESSION: Right lower lobe pneumonia. Early left basilar infiltrate versus atelectasis. Electronically Signed   By: Rolm Baptise M.D.   On: 10/24/2017 09:27   Ct Angio Chest Pe W And/or Wo Contrast  Result Date: 11/12/2017 CLINICAL DATA:  Hemoptysis lung tumor EXAM: CT ANGIOGRAPHY CHEST WITH CONTRAST TECHNIQUE: Multidetector CT imaging of the chest was performed using the standard protocol during bolus administration of intravenous contrast. Multiplanar CT image reconstructions and MIPs were obtained to evaluate the vascular anatomy. CONTRAST:  100 cc Isovue 370 intravenous COMPARISON:  Chest x-ray 10/24/2017, CT chest 10/24/2017, PET-CT 11/08/2017 FINDINGS: Cardiovascular: Significant respiratory motion limits evaluation for pulmonary emboli, mostly within the lower lobes. No acute central embolus is seen. Nonaneurysmal aorta. No dissection. Moderate aortic atherosclerosis. Coronary vascular calcification. Normal heart size. No pericardial effusion Mediastinum/Nodes: Midline trachea. Coarse calcification right lobe of thyroid. Enlarged mediastinal lymph nodes. Right paratracheal lymph node measures 11 mm. Enlarged right hilar lymph nodes measuring 14 mm. Esophagus within normal limits. Lungs/Pleura: Right infrahilar lung mass measuring  approximately 3.5 by 3.1 cm. Right middle lobe lung nodule measuring 18 mm. Mild emphysema. Right lung base mass now confluent with adjacent consolidation making it difficult to measure. Upper Abdomen: No acute abnormality. Stone in the mid pole of left kidney. Musculoskeletal: Left second rib lesion on PET-CT not well demonstrated. No additional suspicious bony findings Review of the MIP images confirms the above findings. IMPRESSION: 1. Limited study secondary to respiratory motion artifact particularly at the lung bases. No definite acute central PE is seen 2. Similar size and appearance of right infrahilar lung mass. Additional mass in the right lower lobe has now become confluent with consolidation in the right lower lobe, making it difficult to measure.  Enlarged mediastinal and right hilar lymph nodes as before. 3. Stable right middle lobe 18 mm lung nodule, this is noted to be PET negative 4. Stone in the left kidney Aortic Atherosclerosis (ICD10-I70.0) and Emphysema (ICD10-J43.9). Electronically Signed   By: Donavan Foil M.D.   On: 11/12/2017 23:46   Mr Jeri Cos JM Contrast  Result Date: 11/08/2017 CLINICAL DATA:  57 year old female undergoing evaluation of suspected stage IIIA primary bronchogenic carcinoma. EXAM: MRI HEAD WITHOUT AND WITH CONTRAST TECHNIQUE: Multiplanar, multiecho pulse sequences of the brain and surrounding structures were obtained without and with intravenous contrast. CONTRAST:  85mL MULTIHANCE GADOBENATE DIMEGLUMINE 529 MG/ML IV SOLN COMPARISON:  PET-CT today reported separately. FINDINGS: Brain: Situated along the left parieto-occipital sulcus in the medial posterior left hemisphere there is an 11 millimeter mildly lobulated enhancing mass with surrounding T2 and FLAIR hyperintensity compatible with vasogenic edema. See series 12, image 35 and series 7, image 16. Minimal regional mass effect. No associated hemosiderin. There is perhaps mild associated linear leptomeningeal  thickening and enhancement (series 12, image 36). No other abnormal enhancing brain lesion is identified. No other abnormal dural thickening or enhancement. No restricted diffusion to suggest acute infarction. No midline shift, ventriculomegaly, extra-axial collection or acute intracranial hemorrhage. Cervicomedullary junction and pituitary are within normal limits. Scattered small nonspecific nonenhancing mostly subcortical cerebral white matter T2 and FLAIR hyperintense foci bilaterally. No cortical encephalomalacia or chronic cerebral blood products identified. Vascular: Major intracranial vascular flow voids are preserved. The major dural venous sinuses are enhancing and appear patent. Skull and upper cervical spine: Normal visible cervical spine and spinal cord. Visualized bone marrow signal is within normal limits. Sinuses/Orbits: Normal orbits soft tissues. Paranasal sinuses are well pneumatized. Other: Mastoid air cells are clear. Visible internal auditory structures appear normal. Scalp and face soft tissues appear negative. IMPRESSION: 1. Positive for early metastatic disease to the brain. There is a solitary 11 mm enhancing mass situated in the left parieto-occipital sulcus with mild regional edema. There may be associated focal leptomeningeal involvement (series 12, image 36). 2. No other metastatic disease or acute intracranial abnormality. Electronically Signed   By: Genevie Ann M.D.   On: 11/08/2017 10:55   Nm Pet Image Initial (pi) Skull Base To Thigh  Result Date: 11/08/2017 CLINICAL DATA:  Initial treatment strategy for right infrahilar mass and mediastinal adenopathy on lung cancer screening chest CT, presumed lung cancer. EXAM: NUCLEAR MEDICINE PET SKULL BASE TO THIGH TECHNIQUE: 9.0 mCi F-18 FDG was injected intravenously. Full-ring PET imaging was performed from the skull base to thigh after the radiotracer. CT data was obtained and used for attenuation correction and anatomic localization.  Fasting blood glucose: 97 mg/dl COMPARISON:  Chest CT 10/24/2017 FINDINGS: Mediastinal blood pool activity: SUV max 2.2 NECK: No hypermetabolic cervical lymph nodes are identified.There are no lesions of the pharyngeal mucosal space. Physiologic activity associated with the muscles of phonation. Incidental CT findings: Bilateral carotid atherosclerosis. CHEST: 4.6 x 3.2 cm right hilar mass (image 79/4) is markedly hypermetabolic with an SUV max of 52. There is a hypermetabolic peripheral right lower lobe mass as well with intervening postobstructive pneumonitis which has mildly improved. This mass measures approximately 3.5 x 3.0 cm on image 47/8 and has an SUV max of 20.7. The 1.8 x 1.3 cm right middle lobe nodule on image 31/8 demonstrates no hypermetabolic activity. There is no suspicious metabolic activity within the left lung. There are small hypermetabolic mediastinal and right hilar lymph nodes. In particular, there is an  11 mm subcarinal node on image 75/4 which has an SUV max of 29.8. There is no contralateral mediastinal or hilar adenopathy. Low level hypermetabolic activity is also demonstrated within a small right axillary node (SUV max 1.4) and superficially in the posterior right shoulder region (SUV max 2.6). Incidental CT findings: Diffuse atherosclerosis of the aorta, great vessels and coronary arteries. No pleural or pericardial effusion. ABDOMEN/PELVIS: There is no hypermetabolic activity within the liver, adrenal glands, spleen or pancreas. There is no hypermetabolic nodal activity. Incidental CT findings: There are small nonobstructing bilateral renal calculi. Also demonstrated are a duodenal diverticulum, a supraumbilical hernia containing only fat and diffuse aortic and branch vessel atherosclerosis. SKELETON: There are several hypermetabolic osseous lesions consistent with metastatic disease. These include a lesion laterally in the left 2nd rib (SUV max 13.1), a 16 mm with sclerotic margins  in the L5 vertebral body (image 140/4, SUV max 22.4), and a small hypermetabolic lesion in the intertrochanteric region of the left femur (SUV max 7.0). There are other smaller lesions. No pathologic fractures are seen. Incidental CT findings: Spondylosis in the lower lumbar spine with mild hypermetabolic activity associated with the L4-5 facet joints. IMPRESSION: 1. The large right lower lobe mass and the associated mediastinal and right hilar adenopathy are hypermetabolic, consistent with lung cancer. In addition, there are several hypermetabolic osseous lesions consistent with metastatic disease. Stage IV disease, T2aN2M1b. 2. Slight improvement in postobstructive pneumonitis in the right lower lobe. 3. The right middle lobe nodule does not demonstrate hypermetabolic activity. 4. Incidental findings including bilateral nephrolithiasis and Aortic Atherosclerosis (ICD10-I70.0). Electronically Signed   By: Richardean Sale M.D.   On: 11/08/2017 15:11   Ct Chest Lung Ca Screen Low Dose W/o Cm  Result Date: 10/24/2017 CLINICAL DATA:  57 year old female with 35 pack year history of smoking. Lung cancer screening. EXAM: CT CHEST WITHOUT CONTRAST LOW-DOSE FOR LUNG CANCER SCREENING TECHNIQUE: Multidetector CT imaging of the chest was performed following the standard protocol without IV contrast. COMPARISON:  None. FINDINGS: Cardiovascular: The heart size is normal. No pericardial effusion. Coronary artery calcification is evident. Atherosclerotic calcification is noted in the wall of the thoracic aorta. Ascending thoracic aorta Mediastinum/Nodes: Low right paratracheal lymph node measures 12 mm short axis. 12 mm short axis subcarinal lymph node evident. 6 mm short axis lymph node identified in the right thoracic inlet (image 10/series 2). There is no axillary lymphadenopathy. The esophagus has normal imaging features. Lungs/Pleura: 3.8 x 5.4 cm lesion identified in the right infrahilar region, generating substantial  mass-effect on the right middle lobe bronchus and obliterating central airway to the right lower lobe. 1.1 x 1.7 cm central right middle lobe pulmonary nodule is identified on image 157 of series 3. There is confluent airspace opacity in the posterior right lung which may be neoplastic or postobstructive in etiology. Centrilobular emphysema evident. Atelectasis/scarring noted in the right middle lobe and lingula. Calcified granuloma identified in the left lower lobe near the major fissure. Upper Abdomen: 12 mm left adrenal nodule has an average attenuation of 1 Hounsfield units suggesting adenoma. Right adrenal gland unremarkable. Nonobstructing renal stones are evident bilaterally. Musculoskeletal: Bone windows reveal no worrisome lytic or sclerotic osseous lesions. IMPRESSION: 1. 5.4 cm right infrahilar mass obliterates right lower lobe bronchus and attenuates the right middle lobe airway. This is associated with mediastinal lymphadenopathy and a 1.7 cm nodule in the central right middle lobe concerning for metastatic involvement. Confluent airspace opacity in the posterior right lower lobe may be neoplastic  and/or postobstructive. Lung-RADS 4B, highly suspicious for primary neoplasm with metastatic disease. Additional imaging evaluation or consultation with Pulmonology or Thoracic Surgery recommended. These results will be called to the ordering clinician or representative by the Radiologist Assistant, and communication documented in the PACS or zVision Dashboard. Electronically Signed   By: Misty Stanley M.D.   On: 10/24/2017 16:48    ASSESSMENT: This is a very pleasant 57 years old white female recently diagnosed with a stage IV (T2b, N2, M1c) non-small cell lung cancer pending final pathology report.  This is diagnosed in May 2019 and presented with large right lower lobe lung mass in addition to mediastinal lymphadenopathy and metastatic disease to bones and brain.  PLAN: I had a lengthy discussion with  the patient today about her current disease of stage, prognosis and treatment options.  I explained to the patient that she has incurable condition and all the treatment will be of palliative nature. If the final pathology is consistent with adenocarcinoma, I will ask the pathologist to send her tissue block for molecular studies and PDL 1 expression. I recommended for the patient to continue her current palliative radiotherapy to the solitary brain metastasis as well as the painful bone lesions and right lower lobe lung mass to help stop the hemoptysis. I discussed with the patient ith her systemic treatment options and give her the option of palliative care versus consideration of palliative systemic chemotherapy minus plus immunotherapy with Keytruda if she has no actionable mutations. I will arrange for the patient follow-up visit with me at the cancer center 1 week after discharge for reevaluation and more detailed discussion of her treatment options based on the final pathology. For schizophrenia, she will continue with her current medications. The patient was advised to call if she has any concerning symptoms in the interval. The patient voices understanding of current disease status and treatment options and is in agreement with the current care plan.  All questions were answered. The patient knows to call the clinic with any problems, questions or concerns. We can certainly see the patient much sooner if necessary.  Thank you so much for allowing me to participate in the care of Jill [name]. I will continue to follow up the patient with you and assist in her care.  Disclaimer: This note was dictated with voice recognition software. Similar sounding words can inadvertently be transcribed and may not be corrected upon review.   Jill [name] Nov 15, 2017, 3:43 PM

## 2017-11-15 NOTE — Progress Notes (Signed)
11/15/17  1140  Notified pharmacy for Haldol around 0830 this morning. Have not received medicine from pharmacy. Called pharmacy and was told they need to call [facility] to get patients dose. Per pharmacy tech they will call soon.

## 2017-11-15 NOTE — Plan of Care (Signed)
  Problem: Nutrition: Goal: Adequate nutrition will be maintained Outcome: Progressing   Problem: Pain Managment: Goal: General experience of comfort will improve Outcome: Progressing   Problem: Safety: Goal: Ability to remain free from injury will improve Outcome: Progressing   

## 2017-11-15 NOTE — Progress Notes (Signed)
The patient presented this morning around 10:30 in the simulation area of the department. She was counseled on the biopsy results of NSCLC, pathology favors adenocarcinoma, though IHC is still pending and will have final result tomorrow. We discussed the role of palliative raditoherapy to the chest, and also the role of SRS to the brain. Dr. Lisbeth Renshaw would favor covering the focal area of concern along the meningeal surface that was described as LMD. In addition, because of the patient's edema on scan, we agree with dexamethasone but will alter her dose to 2 mg BID. Perhaps this can be switched to po tomorrow. She simulated for the chest, and we will need 3T MRI as an outpt and outpatient neurosurgery consultation as well. She will plan to proceed with simulation for the brain once we complete her 3T MRI work up. We will follow up with her and anticipate her radiation starting tomorrow to the chest. Provided that she's stable per medicine, we can continue outpatient treatment when they deem her ready for discharge.    Carola Rhine, PAC

## 2017-11-15 NOTE — Progress Notes (Addendum)
PROGRESS NOTE  Jill [name] DGU:[id number] DOB: 1960-07-14 DOA: 11/12/2017 PCP: Aletha Halim., PA-C  HPI/Recap of past 55 hours: 57 year old female with history of recent diagnosis of metastatic lung cancer on imaging without tissue diagnosis yet, hypertension, hyperlipidemia, schizophrenia and tobacco abuse presented with coughing up blood.  CT his chest showed right-sided lung mass with worsening confluent consolidation.  She was started on broad-spectrum intravenous antibiotics.  Pulmonary was consulted.  11/15/17: seen and examined at bedside.  She reports moderate headache.  Headache cocktail given.  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  We will consult palliative care team for goals of care in the morning.  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 as recommended by oncology Continue to monitor closely  Schizophrenia Continue Haldol IM every 30 days  Hypertension Blood pressure is well controlled Continue home medications     Code Status: Full code  Family Communication: None at bedside  Disposition Plan: Home when clinically  stable   Consultants:  Oncology  Pulmonology  Procedures:  Bronchoscopy  Antimicrobials:  IV cefepime  DVT prophylaxis: SCDs   Objective: Vitals:   11/15/17 0555 11/15/17 0838 11/15/17 0839 11/15/17 1455  BP: 114/70 127/79 127/79 (!) 86/22  Pulse: 92 (!) 101 (!) 102 97  Resp:   20 20  Temp: 98.7 F (37.1 C)   98.6 F (37 C)  TempSrc: Oral   Oral  SpO2: 95% 94% 94% 96%  Weight:      Height:        Intake/Output Summary (Last 24 hours) at 11/15/2017 1705 Last data filed at 11/15/2017 1445 Gross per 24 hour  Intake 440 ml  Output -  Net 440 ml   Filed Weights   11/12/17 2025 11/14/17 2038  Weight: 81.6 kg (180 lb) 80.3 kg (177 lb)    Exam:  . General: 57 y.o. year-old female well developed well nourished in no acute distress.  Alert and oriented x3. . Cardiovascular: Regular rate and rhythm with no rubs or gallops.  No thyromegaly or JVD noted.   Marland Kitchen Respiratory: Clear to auscultation with no wheezes or rales. Good inspiratory effort. . Abdomen: Soft nontender nondistended with normal bowel sounds x4 quadrants. . Musculoskeletal: No lower extremity edema. 2/4 pulses in all 4 extremities. . Skin: No ulcerative lesions noted or rashes, . Psychiatry: Mood is appropriate for condition and setting   Data Reviewed: CBC: Recent Labs  Lab 11/12/17 2117 11/13/17 0550  WBC 12.4* 9.8  HGB 13.9 12.3  HCT 43.1 38.2  MCV 88.3 88.4  PLT 240 956   Basic Metabolic Panel: Recent Labs  Lab 11/12/17 2117 11/13/17 0550  NA 139 138  K 4.0 3.9  CL 104 108  CO2 22 21*  GLUCOSE 97 100*  BUN 21*  14  CREATININE 0.74 0.60  CALCIUM 10.1 9.6   GFR: Estimated Creatinine Clearance: 75.4 mL/min (by C-G formula based on SCr of 0.6 mg/dL). Liver Function Tests: Recent Labs  Lab 11/12/17 2117  AST 20  ALT 18  ALKPHOS 74  BILITOT 0.8  PROT 7.4  ALBUMIN 4.2   Recent Labs  Lab 11/12/17 2116  LIPASE 45   No results for input(s): AMMONIA in the last 168  hours. Coagulation Profile: Recent Labs  Lab 11/12/17 2116  INR 0.98   Cardiac Enzymes: No results for input(s): CKTOTAL, CKMB, CKMBINDEX, TROPONINI in the last 168 hours. BNP (last 3 results) No results for input(s): PROBNP in the last 8760 hours. HbA1C: No results for input(s): HGBA1C in the last 72 hours. CBG: No results for input(s): GLUCAP in the last 168 hours. Lipid Profile: No results for input(s): CHOL, HDL, LDLCALC, TRIG, CHOLHDL, LDLDIRECT in the last 72 hours. Thyroid Function Tests: No results for input(s): TSH, T4TOTAL, FREET4, T3FREE, THYROIDAB in the last 72 hours. Anemia Panel: No results for input(s): VITAMINB12, FOLATE, FERRITIN, TIBC, IRON, RETICCTPCT in the last 72 hours. Urine analysis: No results found for: COLORURINE, APPEARANCEUR, LABSPEC, PHURINE, GLUCOSEU, HGBUR, BILIRUBINUR, KETONESUR, PROTEINUR, UROBILINOGEN, NITRITE, LEUKOCYTESUR Sepsis Labs: @LABRCNTIP (procalcitonin:4,lacticidven:4)  ) Recent Results (from the past 240 hour(s))  Blood culture (routine x 2)     Status: None (Preliminary result)   Collection Time: 11/12/17  9:18 PM  Result Value Ref Range Status   Specimen Description   Final    BLOOD RIGHT ANTECUBITAL Performed at [place] [address] [place], [place] [address]    Special Requests   Final    BOTTLES DRAWN AEROBIC AND ANAEROBIC Blood Culture results may not be optimal due to an excessive volume of blood received in culture bottles Performed at [facility] [address], [place], [place] [address]    Culture   Final    NO GROWTH 2 DAYS Performed at [place] [address] [place], [place] [address]    Report Status PENDING  Incomplete  Blood culture (routine x 2)     Status: None (Preliminary result)   Collection Time: 11/12/17 10:35 PM  Result Value Ref Range Status   Specimen Description   Final    BLOOD LEFT WRIST Performed at [facility], [place]  [address], [place], [place] [address]    Special Requests   Final    BOTTLES DRAWN AEROBIC AND ANAEROBIC Blood Culture adequate volume Performed at [facility] [address], [place], [place] [address]    Culture   Final    NO GROWTH 2 DAYS Performed at [facility] [address], [place], [place] [address]    Report Status PENDING  Incomplete  MRSA PCR Screening     Status: None   Collection Time: 11/13/17  4:07 PM  Result Value Ref Range Status   MRSA by PCR NEGATIVE NEGATIVE Final    Comment:        The GeneXpert MRSA Assay (FDA approved for NASAL specimens only), is one component of a comprehensive MRSA colonization surveillance program. It is not intended to diagnose MRSA infection nor to guide or monitor treatment for MRSA infections. Performed at [facility], [place] [address], [place],  [address]       Studies: No results found.  Scheduled Meds: . dexamethasone  2 mg Intravenous Q12H  . guaiFENesin  600 mg Oral  BID  . haloperidol decanoate  25 mg Intramuscular Q30 days  . hydrochlorothiazide  25 mg Oral Daily  . lisinopril  20 mg Oral Daily  . nicotine  21 mg Transdermal Daily  . polyethylene glycol  17 g Oral Daily  . rosuvastatin  20 mg Oral q1800  . senna-docusate  1 tablet Oral BID    Continuous Infusions: . sodium chloride 10 mL/hr at 11/14/17 2334  . ceFEPime (MAXIPIME) IV Stopped (11/15/17 1445)     LOS: 2 days     Kayleen Memos, MD [facility] Pager [phone]  If 7PM-7AM, please contact night-coverage [email or web address] Password [facility] 11/15/2017, 5:06 PM

## 2017-11-16 ENCOUNTER — Ambulatory Visit: Admission: RE | Admit: 2017-11-16 | Payer: Medicare Other | Source: Ambulatory Visit | Admitting: Radiation Oncology

## 2017-11-16 ENCOUNTER — Ambulatory Visit: Payer: Medicare Other

## 2017-11-16 ENCOUNTER — Ambulatory Visit
Admit: 2017-11-16 | Discharge: 2017-11-16 | Disposition: A | Discharge status: Home / Self Care | Payer: Medicare Other | Attending: Radiation Oncology | Admitting: Radiation Oncology

## 2017-11-16 LAB — BASIC METABOLIC PANEL
Anion gap: 11 (ref 5–15)
BUN: 17 mg/dL (ref 6–20)
CHLORIDE: 105 mmol/L (ref 101–111)
CO2: 24 mmol/L (ref 22–32)
CREATININE: 0.62 mg/dL (ref 0.44–1.00)
Calcium: 9.9 mg/dL (ref 8.9–10.3)
GFR calc Af Amer: >60 mL/min (ref >60)
GFR calc non Af Amer: >60 mL/min (ref >60)
GLUCOSE: 135 mg/dL — AB (ref 65–99)
POTASSIUM: 4.2 mmol/L (ref 3.5–5.1)
Sodium: 140 mmol/L (ref 135–145)

## 2017-11-16 LAB — LEGIONELLA PNEUMOPHILA SEROGP 1 UR AG: L. PNEUMOPHILA SEROGP 1 UR AG: NEGATIVE

## 2017-11-16 LAB — CBC
HEMATOCRIT: 37 % (ref 36.0–46.0)
HEMOGLOBIN: 11.8 g/dL — AB (ref 12.0–15.0)
MCH: 28.2 pg (ref 26.0–34.0)
MCHC: 31.9 g/dL (ref 30.0–36.0)
MCV: 88.3 fL (ref 78.0–100.0)
Platelets: 239 10*3/uL (ref 150–400)
RBC: 4.19 MIL/uL (ref 3.87–5.11)
RDW: 13.9 % (ref 11.5–15.5)
WBC: 12.3 10*3/uL — ABNORMAL HIGH (ref 4.0–10.5)

## 2017-11-16 LAB — PROCALCITONIN: Procalcitonin: <0.1 ng/mL

## 2017-11-16 MED ORDER — KETOROLAC TROMETHAMINE 15 MG/ML IJ SOLN
15.0000 mg | Freq: Once | INTRAMUSCULAR | Status: AC
  Administered 2017-11-16: 15 mg via INTRAVENOUS
  Filled 2017-11-16: qty 1

## 2017-11-16 MED ORDER — GUAIFENESIN-DM 100-10 MG/5ML PO SYRP
5.0000 mL | ORAL_SOLUTION | ORAL | Status: DC | PRN
  Administered 2017-11-16 – 2017-11-17 (×5): 5 mL via ORAL
  Filled 2017-11-16 (×5): qty 10

## 2017-11-16 MED ORDER — PROCHLORPERAZINE EDISYLATE 10 MG/2ML IJ SOLN
10.0000 mg | Freq: Once | INTRAMUSCULAR | Status: AC
  Administered 2017-11-16: 10 mg via INTRAVENOUS
  Filled 2017-11-16: qty 2

## 2017-11-16 MED ORDER — DIPHENHYDRAMINE HCL 50 MG/ML IJ SOLN
12.5000 mg | Freq: Once | INTRAMUSCULAR | Status: AC
  Administered 2017-11-16: 12.5 mg via INTRAVENOUS
  Filled 2017-11-16: qty 1

## 2017-11-16 MED ORDER — BISACODYL 5 MG PO TBEC
10.0000 mg | DELAYED_RELEASE_TABLET | Freq: Once | ORAL | Status: AC
  Administered 2017-11-16: 10 mg via ORAL
  Filled 2017-11-16: qty 2

## 2017-11-16 MED ORDER — POLYETHYLENE GLYCOL 3350 17 G PO PACK
17.0000 g | PACK | Freq: Two times a day (BID) | ORAL | Status: DC
  Administered 2017-11-16 – 2017-11-18 (×4): 17 g via ORAL
  Filled 2017-11-16 (×4): qty 1

## 2017-11-16 MED ORDER — METOCLOPRAMIDE HCL 5 MG/ML IJ SOLN
5.0000 mg | Freq: Once | INTRAMUSCULAR | Status: AC
  Administered 2017-11-16: 5 mg via INTRAVENOUS
  Filled 2017-11-16: qty 2

## 2017-11-16 MED ORDER — KETOROLAC TROMETHAMINE 15 MG/ML IJ SOLN
15.0000 mg | Freq: Three times a day (TID) | INTRAMUSCULAR | Status: DC | PRN
  Administered 2017-11-16 – 2017-11-18 (×6): 15 mg via INTRAVENOUS
  Filled 2017-11-16 (×7): qty 1

## 2017-11-16 MED ORDER — DIPHENHYDRAMINE HCL 50 MG/ML IJ SOLN
25.0000 mg | Freq: Once | INTRAMUSCULAR | Status: AC
  Administered 2017-11-16: 25 mg via INTRAVENOUS
  Filled 2017-11-16: qty 1

## 2017-11-16 MED ORDER — KETOROLAC TROMETHAMINE 30 MG/ML IJ SOLN
30.0000 mg | Freq: Once | INTRAMUSCULAR | Status: AC
  Administered 2017-11-16: 30 mg via INTRAVENOUS
  Filled 2017-11-16: qty 1

## 2017-11-16 NOTE — Progress Notes (Signed)
Palliative:  I went to visit with patient but she was sound asleep and did not awaken to voice. I see plans for f/u with oncology for options and currently undergoing radiation. RN reports that she is doing well and plans for discharge tomorrow. Recommend outpatient palliative follow up if discharged prior to palliative consultation.   No charge  Vinie Sill, NP Palliative Medicine Team Pager # [phone] (M-F 8a-5p) Team Phone # [phone] (Nights/Weekends)

## 2017-11-16 NOTE — Progress Notes (Signed)
[facility] Therapy Treatment Record Phone [phone]   Radiation Therapy was administered to Jill [name] on: 11/16/2017  11:43 AM and was treatment #1out of a planned course of 10 treatments.  Radiation Treatment  1). Beam photons with 6-10 energy  2). Brachytherapy None  3). Stereotactic Radiosurgery None  4). Other Radiation None     Jacquiline Zurcher, RT (T)

## 2017-11-16 NOTE — Progress Notes (Signed)
Pharmacy Antibiotic Note  Jill [name] is a 57 y.o. female admitted on 11/12/2017 with pneumonia.  Pharmacy has been consulted for cefepime dosing.  Plan:  Continue Cefepime 1g IV q8h  Follow up renal fxn, culture results, and clinical course.  F/u ability to de-escalate antibiotics.   Height: 5\' 1"  (154.9 cm) Weight: 177 lb (80.3 kg) IBW/kg (Calculated) : 47.8  Temp (24hrs), Avg:98.5 F (36.9 C), Min:98.2 F (36.8 C), Max:98.7 F (37.1 C)  Recent Labs  Lab 11/12/17 2117 11/12/17 2125 11/13/17 0550 11/16/17 0343  WBC 12.4*  --  9.8 12.3*  CREATININE 0.74  --  0.60 0.62  LATICACIDVEN  --  0.93  --   --     Estimated Creatinine Clearance: 75.4 mL/min (by C-G formula based on SCr of 0.62 mg/dL).    No Known Allergies  Antimicrobials this admission: Vancomycin 11/13/2017 >>5/14 Cefepime 11/13/2017 >>  Dose adjustments this admission: n/a Microbiology results: 5/12 BCx2: ngtd 5/13 MRSA PCR: neg 5/13 strep pneumo neg 5/13 HIV neg  Thank you for allowing pharmacy to be a part of this patient's care.  Gretta Arab PharmD, BCPS Pager [phone] 11/16/2017 9:16 AM

## 2017-11-16 NOTE — Progress Notes (Signed)
PROGRESS NOTE  Jill [name] [id number] DOB: Apr 30, 1961 DOA: 11/12/2017 PCP: Aletha Halim., PA-C  HPI/Recap of past 60 hours: 57 year old female with history of recent diagnosis of metastatic lung cancer on imaging without tissue diagnosis yet, hypertension, hyperlipidemia, schizophrenia and tobacco abuse presented with coughing up blood.  CT his chest showed right-sided lung mass with worsening confluent consolidation.  She was started on broad-spectrum intravenous antibiotics.  Pulmonary was consulted.  11/15/17: seen and examined at bedside.  She reports moderate headache.  Headache cocktail given.  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  We will consult palliative care team for goals of care in the morning.  11/16/17: No new complaints. Radiation planned this morning.  Assessment/Plan: Principal Problem:   Sepsis due to pneumonia ([place]) Active Problems:   Schizophrenia ([place])   TOBACCO DEPENDENCE   HYPERTENSION, BENIGN SYSTEMIC   Hemoptysis   Hypoxia   Metastatic lung cancer (metastasis from lung to other site), right ([place])   Headache   Brain metastases ([place])  NSCLC with mets to bones and brain Radiation planned today. 1/10 radiation session completed Palliative care team consulted for goals of care and symptoms management  Suspected HCAP Complete 5 days Cefepime procalcitonin 0.10 (11/16/17) Continue breathing treatment Continue O2 supplementation to maintain O2 saturation 92% or greater  Cerebral edema, stable Continue with Decadron 2 mg twice daily May consider switching to p.o. as recommended by oncology Continue to monitor closely  Schizophrenia, stable Continue Haldol IM every 30 days  HTN, stable Blood pressure is well controlled Continue home medications  Physical debility PT ordered to evaluate OOB to chair Fall precaution   Code Status: Full code  Family Communication: None at  bedside  Disposition Plan: Possibly tomorrow 11/17/17 if no events overnight.   Consultants:  Oncology  Pulmonology  Procedures:  Bronchoscopy  Antimicrobials:  IV cefepime  DVT prophylaxis: SCDs   Objective: Vitals:   11/15/17 1850 11/15/17 2313 11/16/17 0445 11/16/17 1438  BP: 106/64 104/60 112/80 108/62  Pulse: 99 83 90 86  Resp: 20 20 20 18   Temp:  98.7 F (37.1 C) 98.2 F (36.8 C) 98.3 F (36.8 C)  TempSrc:  Oral Oral Oral  SpO2: 92% 95% 93% 94%  Weight:      Height:        Intake/Output Summary (Last 24 hours) at 11/16/2017 1548 Last data filed at 11/16/2017 0021 Gross per 24 hour  Intake 613.5 ml  Output -  Net 613.5 ml   Filed Weights   11/12/17 2025 11/14/17 2038  Weight: 81.6 kg (180 lb) 80.3 kg (177 lb)    Exam:  . General: 57 y.o. year-old female WD WN NAD A&O x 3. . Cardiovascular:RRR no rubs or gallops. No JVD  Or thyromegaly. Marland Kitchen Respiratory: Clear to auscultation with no wheezes or rales. Good inspiratory effort. . Abdomen: Soft nontender nondistended with normal bowel sounds x4 quadrants. . Musculoskeletal: No lower extremity edema. 2/4 pulses in all 4 extremities. . Skin: No ulcerative lesions noted or rashes, . Psychiatry: Mood is appropriate for condition and setting   Data Reviewed: CBC: Recent Labs  Lab 11/12/17 2117 11/13/17 0550 11/16/17 0343  WBC 12.4* 9.8 12.3*  HGB 13.9 12.3 11.8*  HCT 43.1 38.2 37.0  MCV 88.3 88.4 88.3  PLT 240 199 093   Basic Metabolic Panel: Recent Labs  Lab 11/12/17 2117 11/13/17 0550 11/16/17 0343  NA 139 138 140  K 4.0 3.9  4.2  CL 104 108 105  CO2 22 21* 24  GLUCOSE 97 100* 135*  BUN 21* 14 17  CREATININE 0.74 0.60 0.62  CALCIUM 10.1 9.6 9.9   GFR: Estimated Creatinine Clearance: 75.4 mL/min (by C-G formula based on SCr of 0.62 mg/dL). Liver Function Tests: Recent Labs  Lab 11/12/17 2117  AST 20  ALT 18  ALKPHOS 74  BILITOT 0.8  PROT 7.4  ALBUMIN 4.2   Recent Labs  Lab  11/12/17 2116  LIPASE 45   No results for input(s): AMMONIA in the last 168 hours. Coagulation Profile: Recent Labs  Lab 11/12/17 2116  INR 0.98   Cardiac Enzymes: No results for input(s): CKTOTAL, CKMB, CKMBINDEX, TROPONINI in the last 168 hours. BNP (last 3 results) No results for input(s): PROBNP in the last 8760 hours. HbA1C: No results for input(s): HGBA1C in the last 72 hours. CBG: No results for input(s): GLUCAP in the last 168 hours. Lipid Profile: No results for input(s): CHOL, HDL, LDLCALC, TRIG, CHOLHDL, LDLDIRECT in the last 72 hours. Thyroid Function Tests: No results for input(s): TSH, T4TOTAL, FREET4, T3FREE, THYROIDAB in the last 72 hours. Anemia Panel: No results for input(s): VITAMINB12, FOLATE, FERRITIN, TIBC, IRON, RETICCTPCT in the last 72 hours. Urine analysis: No results found for: COLORURINE, APPEARANCEUR, LABSPEC, PHURINE, GLUCOSEU, HGBUR, BILIRUBINUR, KETONESUR, PROTEINUR, UROBILINOGEN, NITRITE, LEUKOCYTESUR Sepsis Labs: @LABRCNTIP (procalcitonin:4,lacticidven:4)  ) Recent Results (from the past 240 hour(s))  Blood culture (routine x 2)     Status: None (Preliminary result)   Collection Time: 11/12/17  9:18 PM  Result Value Ref Range Status   Specimen Description   Final    BLOOD RIGHT ANTECUBITAL Performed at [facility] [address], [place], [place] [address]    Special Requests   Final    BOTTLES DRAWN AEROBIC AND ANAEROBIC Blood Culture results may not be optimal due to an excessive volume of blood received in culture bottles Performed at [facility] [address] [place], [place] [address]    Culture   Final    NO GROWTH 3 DAYS Performed at [facility], [place] [address], [place], [place] [address]    Report Status PENDING  Incomplete  Blood culture (routine x 2)     Status: None (Preliminary result)   Collection Time: 11/12/17 10:35 PM  Result Value Ref Range Status   Specimen  Description   Final    BLOOD LEFT WRIST Performed at [facility], [place] [address] [place], [place] [address]    Special Requests   Final    BOTTLES DRAWN AEROBIC AND ANAEROBIC Blood Culture adequate volume Performed at [facility] [address] [place], [place] [address]    Culture   Final    NO GROWTH 3 DAYS Performed at [facility], [place] [address] [place], [place] [address]    Report Status PENDING  Incomplete  MRSA PCR Screening     Status: None   Collection Time: 11/13/17  4:07 PM  Result Value Ref Range Status   MRSA by PCR NEGATIVE NEGATIVE Final    Comment:        The GeneXpert MRSA Assay (FDA approved for NASAL specimens only), is one component of a comprehensive MRSA colonization surveillance program. It is not intended to diagnose MRSA infection nor to guide or monitor treatment for MRSA infections. Performed at [facility], [place] [address] [place], [place] [address]  Studies: No results found.  Scheduled Meds: . dexamethasone  2 mg Intravenous Q12H  . haloperidol decanoate  25 mg Intramuscular Q30 days  . hydrochlorothiazide  25 mg Oral Daily  . lisinopril  20 mg Oral Daily  . nicotine  21 mg Transdermal Daily  . polyethylene glycol  17 g Oral BID  . rosuvastatin  20 mg Oral q1800  . senna-docusate  1 tablet Oral BID    Continuous Infusions: . sodium chloride 10 mL/hr at 11/16/17 0541     LOS: 3 days     Kayleen Memos, MD [facility] Pager [phone]  If 7PM-7AM, please contact night-coverage [email or web address] Password [facility] 11/16/2017, 3:48 PM

## 2017-11-16 NOTE — Care Management Important Message (Signed)
Important Message  Patient Details  Name: Jill [name] MRN: [id number] Date of Birth: [birth year]   Medicare Important Message Given:  Yes    [name] 11/16/2017, 11:46 AMImportant Message  Patient Details  Name: Jill [name] MRN: [id number] Date of Birth: Sep 11, 1960   Medicare Important Message Given:  Yes    [name] 11/16/2017, 11:45 AM

## 2017-11-17 ENCOUNTER — Ambulatory Visit
Admit: 2017-11-17 | Discharge: 2017-11-17 | Disposition: A | Discharge status: Home / Self Care | Payer: Medicare Other | Attending: Radiation Oncology | Admitting: Radiation Oncology

## 2017-11-17 DIAGNOSIS — C3431 Malignant neoplasm of lower lobe, right bronchus or lung: Secondary | ICD-10-CM | POA: Insufficient documentation

## 2017-11-17 LAB — CBC
HCT: 38.6 % (ref 36.0–46.0)
HEMOGLOBIN: 12.6 g/dL (ref 12.0–15.0)
MCH: 28.5 pg (ref 26.0–34.0)
MCHC: 32.6 g/dL (ref 30.0–36.0)
MCV: 87.3 fL (ref 78.0–100.0)
Platelets: 216 10*3/uL (ref 150–400)
RBC: 4.42 MIL/uL (ref 3.87–5.11)
RDW: 13.8 % (ref 11.5–15.5)
WBC: 8.2 10*3/uL (ref 4.0–10.5)

## 2017-11-17 MED ORDER — ENOXAPARIN SODIUM 40 MG/0.4ML ~~LOC~~ SOLN
40.0000 mg | SUBCUTANEOUS | Status: DC
  Administered 2017-11-17: 40 mg via SUBCUTANEOUS
  Filled 2017-11-17 (×2): qty 0.4

## 2017-11-17 MED ORDER — BENZONATATE 100 MG PO CAPS
200.0000 mg | ORAL_CAPSULE | Freq: Two times a day (BID) | ORAL | Status: DC
  Administered 2017-11-17 – 2017-11-18 (×3): 200 mg via ORAL
  Filled 2017-11-17 (×3): qty 2

## 2017-11-17 NOTE — Progress Notes (Signed)
PROGRESS NOTE  Jill [name] FFM:[id number] DOB: July 22, 1960 DOA: 11/12/2017 PCP: Aletha Halim., PA-C  HPI/Recap of past 33 hours: 57 year old female with history of recent diagnosis of metastatic lung cancer on imaging without tissue diagnosis yet, hypertension, hyperlipidemia, schizophrenia and tobacco abuse presented with coughing up blood.  CT his chest showed right-sided lung mass with worsening confluent consolidation.  She was started on broad-spectrum intravenous antibiotics.  Pulmonary was consulted.  11/15/17: seen and examined at bedside.  She reports moderate headache.  Headache cocktail given.  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  We will consult palliative care team for goals of care in the morning.  11/16/17: No new complaints. Radiation planned this morning.  11/17/17: Headache which is improved with toradol. NO chest pain or dyspnea at rest. First radiation done yesterday 11/16/17. Next Radiation planned today.  Assessment/Plan: Principal Problem:   Sepsis due to pneumonia ([place]) Active Problems:   Schizophrenia ([place])   TOBACCO DEPENDENCE   HYPERTENSION, BENIGN SYSTEMIC   Hemoptysis   Hypoxia   Metastatic lung cancer (metastasis from lung to other site), right (HCC)   Headache   Brain metastases (HCC)  NSCLC, stable Mets to bones and brain Radiation planned today. 2/10 radiation session completed Palliative care team consulted for goals of care and symptoms management  HCAP, stable Completed 5 days Cefepime procalcitonin 0.10 (11/16/17) Continue breathing treatment Continue O2 supplementation to maintain O2 saturation 92% or greater  Cerebral edema, stable Continue with Decadron 2 mg twice daily May consider switching to p.o. as recommended by oncology Continue to monitor closely  Schizophrenia, stable Continue Haldol IM every 30 days  HTN, stable Blood pressure is well  controlled Continue home medications  Physical debility PT recommends HHPT OOB to chair Fall precaution   Code Status: Full code  Family Communication: None at bedside  Disposition Plan: Possibly tomorrow 11/17/17 if no events overnight.   Consultants:  Oncology  Pulmonology  Procedures:  Bronchoscopy  Antimicrobials:  None  DVT prophylaxis: Sq lovenox   Objective: Vitals:   11/16/17 0445 11/16/17 1438 11/16/17 2012 11/17/17 0644  BP: 112/80 108/62 110/77 112/63  Pulse: 90 86 91 70  Resp: 20 18 16 17   Temp: 98.2 F (36.8 C) 98.3 F (36.8 C) 97.8 F (36.6 C) 97.8 F (36.6 C)  TempSrc: Oral Oral Oral Oral  SpO2: 93% 94% 92% 92%  Weight:      Height:        Intake/Output Summary (Last 24 hours) at 11/17/2017 1436 Last data filed at 11/17/2017 1043 Gross per 24 hour  Intake 240 ml  Output -  Net 240 ml   Filed Weights   11/12/17 2025 11/14/17 2038  Weight: 81.6 kg (180 lb) 80.3 kg (177 lb)    Exam:  . General: 57 y.o. year-old female WD WN nAD. A&O x 3 . Cardiovascular: RRR no rubs or gallops. No JVD or thyromegaly. [place] Kitchen Respiratory: Mild rales at bases.Good inspiratory effort. . Abdomen: Soft nontender nondistended with normal bowel sounds x4 quadrants. . Musculoskeletal: No lower extremity edema. 2/4 pulses in all 4 extremities. . Skin: No ulcerative lesions noted or rashes, . Psychiatry: Mood is appropriate for condition and setting   Data Reviewed: CBC: Recent Labs  Lab 11/12/17 2117 11/13/17 0550 11/16/17 0343 11/17/17 0822  WBC 12.4* 9.8 12.3* 8.2  HGB 13.9 12.3 11.8* 12.6  HCT 43.1 38.2 37.0 38.6  MCV 88.3 88.4 88.3 87.3  PLT  240 199 239 409   Basic Metabolic Panel: Recent Labs  Lab 11/12/17 2117 11/13/17 0550 11/16/17 0343  NA 139 138 140  K 4.0 3.9 4.2  CL 104 108 105  CO2 22 21* 24  GLUCOSE 97 100* 135*  BUN 21* 14 17  CREATININE 0.74 0.60 0.62  CALCIUM 10.1 9.6 9.9   GFR: Estimated Creatinine Clearance: 75.4  mL/min (by C-G formula based on SCr of 0.62 mg/dL). Liver Function Tests: Recent Labs  Lab 11/12/17 2117  AST 20  ALT 18  ALKPHOS 74  BILITOT 0.8  PROT 7.4  ALBUMIN 4.2   Recent Labs  Lab 11/12/17 2116  LIPASE 45   No results for input(s): AMMONIA in the last 168 hours. Coagulation Profile: Recent Labs  Lab 11/12/17 2116  INR 0.98   Cardiac Enzymes: No results for input(s): CKTOTAL, CKMB, CKMBINDEX, TROPONINI in the last 168 hours. BNP (last 3 results) No results for input(s): PROBNP in the last 8760 hours. HbA1C: No results for input(s): HGBA1C in the last 72 hours. CBG: No results for input(s): GLUCAP in the last 168 hours. Lipid Profile: No results for input(s): CHOL, HDL, LDLCALC, TRIG, CHOLHDL, LDLDIRECT in the last 72 hours. Thyroid Function Tests: No results for input(s): TSH, T4TOTAL, FREET4, T3FREE, THYROIDAB in the last 72 hours. Anemia Panel: No results for input(s): VITAMINB12, FOLATE, FERRITIN, TIBC, IRON, RETICCTPCT in the last 72 hours. Urine analysis: No results found for: COLORURINE, APPEARANCEUR, LABSPEC, PHURINE, GLUCOSEU, HGBUR, BILIRUBINUR, KETONESUR, PROTEINUR, UROBILINOGEN, NITRITE, LEUKOCYTESUR Sepsis Labs: @LABRCNTIP (procalcitonin:4,lacticidven:4)  ) Recent Results (from the past 240 hour(s))  Blood culture (routine x 2)     Status: None (Preliminary result)   Collection Time: 11/12/17  9:18 PM  Result Value Ref Range Status   Specimen Description   Final    BLOOD RIGHT ANTECUBITAL Performed at [facility] [address], [place], [place] [address]    Special Requests   Final    BOTTLES DRAWN AEROBIC AND ANAEROBIC Blood Culture results may not be optimal due to an excessive volume of blood received in culture bottles Performed at [facility] [address] [place], [place] [address]    Culture   Final    NO GROWTH 4 DAYS Performed at [facility], [place] [address] [place], [place]  [address]    Report Status PENDING  Incomplete  Blood culture (routine x 2)     Status: None (Preliminary result)   Collection Time: 11/12/17 10:35 PM  Result Value Ref Range Status   Specimen Description   Final    BLOOD LEFT WRIST Performed at [facility], [place] [address] [place], [place] [address]    Special Requests   Final    BOTTLES DRAWN AEROBIC AND ANAEROBIC Blood Culture adequate volume Performed at [facility] [address] [place], [place] [address]    Culture   Final    NO GROWTH 4 DAYS Performed at [facility], [place] [address], [place], [place] [address]    Report Status PENDING  Incomplete  MRSA PCR Screening     Status: None   Collection Time: 11/13/17  4:07 PM  Result Value Ref Range Status   MRSA by PCR NEGATIVE NEGATIVE Final    Comment:        The GeneXpert MRSA Assay (FDA approved for NASAL specimens only), is one component of a comprehensive MRSA colonization surveillance program. It is not intended to diagnose MRSA  infection nor to guide or monitor treatment for MRSA infections. Performed at [facility], [place] [address], [place], [place] [address]       Studies: No results found.  Scheduled Meds: . benzonatate  200 mg Oral BID  . dexamethasone  2 mg Intravenous Q12H  . enoxaparin (LOVENOX) injection  40 mg Subcutaneous Q24H  . haloperidol decanoate  25 mg Intramuscular Q30 days  . hydrochlorothiazide  25 mg Oral Daily  . lisinopril  20 mg Oral Daily  . nicotine  21 mg Transdermal Daily  . polyethylene glycol  17 g Oral BID  . rosuvastatin  20 mg Oral q1800  . senna-docusate  1 tablet Oral BID    Continuous Infusions: . sodium chloride 10 mL/hr at 11/16/17 0541     LOS: 4 days     Kayleen Memos, MD [facility] Pager [phone]  If 7PM-7AM, please contact night-coverage [email or web address] Password [facility] 11/17/2017, 2:36 PM

## 2017-11-17 NOTE — Consult Note (Signed)
Consultation Note Date: 11/17/2017   Patient Name: Jill [name]  DOB: 05-Mar-1961  MRN: [id number]  Age / Sex: 57 y.o., female  PCP: Aletha Halim., PA-C Referring Physician: Kayleen Memos, DO  Reason for Consultation: Establishing goals of care  HPI/Patient Profile: 57 y.o. female admitted on 11/12/2017     Clinical Assessment and Goals of Care:  Jill [name] is a 57 year old lady who lives in a mobile home in [place], [place]. Her 2 brothers who live locally and her her support system. Her dad has dementia and lives in a skilled nursing facility. Patient has past medical history significant for hypertension schizophrenia and long history of cigarette smoking. Over the course of the past year the patient states that she has become more fatigued she has had back pain and she has had voice changes.  She has recently been diagnosed with stage IV non-small cell lung cancer, large right lower lobe lung mass, mediastinal adenopathy, metastatic disease to bones and brain.  She has been seen by medical oncology and radiation oncology. Additional tumor markers are pending. She is on palliative radiotherapy for brain and bone metastatic burden. She is under consideration for palliative systemic chemotherapy, questionably also immunotherapy if possible based on further testing and further mutation testing.  A palliative consultation has been requested for goals of care discussions.  Patient is awake alert resting in chair. She participated well with physical therapy. I introduced myself and palliative care as follows: Palliative medicine is specialized medical care for people living with serious illness. It focuses on providing relief from the symptoms and stress of a serious illness. The goal is to improve quality of life for both the patient and the family.  Patient states that she was worried something  was wrong with her health and she started having various symptoms throughout the course of the past year. She states that she is definitely terribly burdened by the diagnosis of metastatic lung cancer, however she chooses to focus on the things she can control. She wishes to go back home soon. She states that she has given up smoking and will only use nicotine patch going forwards. She hopes that she will be a candidate for some type of palliative chemotherapy/immunotherapy. We again discussed that further treatments were palliative in nature. She is hopeful that radiation can help alleviate her bone pain.  Brief life review performed. She states she depends on her brothers had anteriorly who will transport her for appointments and to check on her regularly. She states that the biggest stressor in her life was in 2004 when her 51 year old son Jill [name] he passed away. He had autism and died of a seizure disorder.  Extensively provided supportive care and active listening. Endorsed the patient's wishes to be as well as she can for as long a she can. The patient is aware of the seriousness of her malignancy.See additional discussions/recommendations below. Thank you for the consult.  NEXT OF KIN 2 brothers Jill [name] and Jill [name] who live nearby and her support  system.   SUMMARY OF RECOMMENDATIONS    1. Full code for now.  Home with home health care, home PT on discharge, recommend out patient palliative care on discharge.  2. Advanced care planning: patient elects both her brothers Jill [name] and Jill [name] to be her HCPOA agents, recommend completion of advanced care planning documents at cancer center.  3. Agree with PO PRN Oxy IR for pain/dyspnea.  4. Agree with current planned radiation treatments, last one scheduled for Nov 30, 2017.   Code Status/Advance Care Planning:  Full code    Symptom Management:    continue Oxy IR 5 mg PO Q 6 hours PRN.   Palliative Prophylaxis:   Bowel  Regimen  Additional Recommendations (Limitations, Scope, Preferences):  Full Scope Treatment  Psycho-social/Spiritual:   Desire for further Chaplaincy support:yes  Additional Recommendations: Caregiving  Support/Resources  Prognosis:   Unable to determine  Discharge Planning: Home with Home Health      Primary Diagnoses: Present on Admission: . Sepsis due to pneumonia ([place]) . Hemoptysis . Hypoxia . TOBACCO DEPENDENCE . Schizophrenia ([place]) . HYPERTENSION, BENIGN SYSTEMIC . Metastatic lung cancer (metastasis from lung to other site), right ([place]) . Headache   I have reviewed the medical record, interviewed the patient and family, and examined the patient. The following aspects are pertinent.  Past Medical History:  Diagnosis Date  . Cancer ([place])   . Hypertension   . Schizophrenia [facility])    Social History   Socioeconomic History  . Marital status: Single    Spouse name: Not on file  . Number of children: Not on file  . Years of education: Not on file  . Highest education level: Not on file  Occupational History  . Not on file  Social Needs  . Financial resource strain: Not on file  . Food insecurity:    Worry: Not on file    Inability: Not on file  . Transportation needs:    Medical: Not on file    Non-medical: Not on file  Tobacco Use  . Smoking status: Current Every Day Smoker    Packs/day: 2.00    Years: 36.00    Pack years: 72.00    Types: Cigarettes    Start date: 10/31/1981  . Smokeless tobacco: Never Used  . Tobacco comment: HAS CALLED QUIT 4 FREE ALREADY  Substance and Sexual Activity  . Alcohol use: Never    Frequency: Never  . Drug use: Never  . Sexual activity: Not on file  Lifestyle  . Physical activity:    Days per week: Not on file    Minutes per session: Not on file  . Stress: Not on file  Relationships  . Social connections:    Talks on phone: Not on file    Gets together: Not on file    Attends religious service: Not on file     Active member of club or organization: Not on file    Attends meetings of clubs or organizations: Not on file    Relationship status: Not on file  Other Topics Concern  . Not on file  Social History Narrative  . Not on file   History reviewed. No pertinent family history. Scheduled Meds: . benzonatate  200 mg Oral BID  . dexamethasone  2 mg Intravenous Q12H  . enoxaparin (LOVENOX) injection  40 mg Subcutaneous Q24H  . haloperidol decanoate  25 mg Intramuscular Q30 days  . hydrochlorothiazide  25 mg Oral Daily  . lisinopril  20 mg  Oral Daily  . nicotine  21 mg Transdermal Daily  . polyethylene glycol  17 g Oral BID  . rosuvastatin  20 mg Oral q1800  . senna-docusate  1 tablet Oral BID   Continuous Infusions: . sodium chloride 10 mL/hr at 11/16/17 0541   PRN Meds:.acetaminophen **OR** acetaminophen, guaiFENesin-dextromethorphan, ipratropium-albuterol, ketorolac, morphine injection, ondansetron **OR** ondansetron (ZOFRAN) IV, oxyCODONE Medications Prior to Admission:  Prior to Admission medications   Medication Sig Start Date End Date Taking? Authorizing Provider  aspirin EC 81 MG tablet Take 162 mg by mouth daily.    Yes [provider]  aspirin-acetaminophen-caffeine (EXCEDRIN MIGRAINE) [phone] MG tablet Take 2 tablets by mouth every 6 (six) hours as needed for headache.   Yes [provider]  haloperidol decanoate (HALDOL DECANOATE) 50 MG/ML injection Inject 25 mg into the muscle every 28 (twenty-eight) days. [place] by Filomena Jungling [phone]    Yes [provider]  HYDROcodone-acetaminophen (HYCET) 7.5-325 mg/15 ml solution Take 5 mLs by mouth at bedtime.   Yes [provider]  lisinopril-hydrochlorothiazide (PRINZIDE,ZESTORETIC) 20-25 MG tablet Take 1 tablet by mouth daily.   Yes [provider]  nicotine (NICODERM CQ - DOSED IN MG/24 HOURS) 21 mg/24hr patch Place 21 mg onto the skin daily.   Yes [provider]  promethazine (PHENERGAN) 12.5 MG tablet Take 12.5 mg by mouth as needed for nausea/vomiting. 11/12/17  Yes [provider]  rosuvastatin (CRESTOR) 20 MG tablet Take 20 mg by mouth daily at 6 PM.   Yes [provider]   No Known Allergies Review of Systems + back pain  Physical Exam Awake alert Participated well with PT Regular Clear S1 S2 No edema Non focal   Vital Signs: BP 112/63 (BP Location: Left Arm)   Pulse 70   Temp 97.8 F (36.6 C) (Oral)   Resp 17   Ht 5\' 1"  (1.549 m)   Wt 80.3 kg (177 lb)   SpO2 92%   BMI 33.44 kg/m  Pain Scale: 0-10 POSS *See Group Information*: 1-Acceptable,Awake and alert Pain Score: Asleep   SpO2: SpO2: 92 % O2 Device:SpO2: 92 % O2 Flow Rate: .O2 Flow Rate (L/min): 2 L/min  IO: Intake/output summary:   Intake/Output Summary (Last 24 hours) at 11/17/2017 1224 Last data filed at 11/17/2017 1043 Gross per 24 hour  Intake 240 ml  Output -  Net 240 ml    LBM: Last BM Date: 11/16/17 Baseline Weight: Weight: 81.6 kg (180 lb) Most recent weight: Weight: 80.3 kg (177 lb)     Palliative Assessment/Data:   PPS 40-50%  Time In:  11.30 Time Out:  12.30 Time Total:  60 min  Greater than 50%  of this time was spent counseling and coordinating care related to the above assessment and plan.  Signed by: Loistine Chance, MD  [phone]  Please contact Palliative Medicine Team phone at [phone] for questions and concerns.  For individual provider: See Shea Evans

## 2017-11-17 NOTE — Evaluation (Signed)
Physical Therapy Evaluation Patient Details Name: Jill [name] MRN: [id number] DOB: August 23, 1960 Today's Date: 11/17/2017   History of Present Illness  57 yo female admitted with sepsis due to pna. Hx of lung cancer with mets, bipolar, schizophrenia  Clinical Impression  On eval, pt was Min guard assist for mobility. She walked ~200 feet. O2 sats 88-89% on RA, dyspnea 2/4. Mildly unsteady at times. Discussed d/c plan-pt plans to return home. She stated her brothers live close by and are able to check on her. She is interested in possibly having a home health aide to help with ADLs. Recommend HHPT f/u as well.     Follow Up Recommendations Home health PT    Equipment Recommendations  3in1 (tub bench vs shower chair; home health aide if possible)    Recommendations for Other Services       Precautions / Restrictions Precautions Precautions: Fall Restrictions Weight Bearing Restrictions: No      Mobility  Bed Mobility Overal bed mobility: Modified Independent                Transfers Overall transfer level: Modified independent                  Ambulation/Gait Ambulation/Gait assistance: Min guard Ambulation Distance (Feet): 200 Feet Assistive device: None(dynamap on way back to room) Gait Pattern/deviations: Step-through pattern;Drifts right/left     General Gait Details: mildy unsteady at times but no overt LOB. O2 sat 88-89% on RA, dyspnea 2/4. Pt tolerated distance well.   Stairs            Wheelchair Mobility    Modified Rankin (Stroke Patients Only)       Balance Overall balance assessment: Mild deficits observed, not formally tested                                           Pertinent Vitals/Pain Pain Assessment: Faces Pain Score: 3  Pain Location: under L breast Pain Descriptors / Indicators: Aching;Sore;Discomfort Pain Intervention(s): Monitored during session    Home Living Family/patient expects to be  discharged to:: Private residence Living Arrangements: Alone   Type of Home: Mobile home Home Access: Stairs to enter Entrance Stairs-Rails: Right Entrance Stairs-Number of Steps: 4 Home Layout: One level Home Equipment: None      Prior Function Level of Independence: Independent               Hand Dominance        Extremity/Trunk Assessment   Upper Extremity Assessment Upper Extremity Assessment: Overall WFL for tasks assessed    Lower Extremity Assessment Lower Extremity Assessment: Generalized weakness    Cervical / Trunk Assessment Cervical / Trunk Assessment: Normal  Communication   Communication: No difficulties  Cognition Arousal/Alertness: Awake/alert Behavior During Therapy: WFL for tasks assessed/performed Overall Cognitive Status: Within Functional Limits for tasks assessed                                        General Comments      Exercises     Assessment/Plan    PT Assessment Patient needs continued PT services  PT Problem List Decreased mobility;Decreased activity tolerance;Decreased strength       PT Treatment Interventions Gait training;Functional mobility training;Balance training;Therapeutic exercise;Patient/family education;Therapeutic activities  PT Goals (Current goals can be found in the Care Plan section)  Acute Rehab PT Goals Patient Stated Goal: home PT Goal Formulation: With patient Time For Goal Achievement: 12/01/17 Potential to Achieve Goals: Good    Frequency Min 3X/week   Barriers to discharge        Co-evaluation               AM-PAC PT "6 Clicks" Daily Activity  Outcome Measure Difficulty turning over in bed (including adjusting bedclothes, sheets and blankets)?: None Difficulty moving from lying on back to sitting on the side of the bed? : None Difficulty sitting down on and standing up from a chair with arms (e.g., wheelchair, bedside commode, etc,.)?: None Help needed moving to  and from a bed to chair (including a wheelchair)?: A Little Help needed walking in hospital room?: A Little Help needed climbing 3-5 steps with a railing? : A Little 6 Click Score: 21    End of Session Equipment Utilized During Treatment: Gait belt Activity Tolerance: Patient tolerated treatment well Patient left: in chair;with call bell/phone within reach   PT Visit Diagnosis: Muscle weakness (generalized) (M62.81);Difficulty in walking, not elsewhere classified (R26.2)    Time: 9147-8295 PT Time Calculation (min) (ACUTE ONLY): 21 min   Charges:   PT Evaluation $PT Eval Moderate Complexity: 1 Mod     PT G Codes:         Weston Anna, MPT Pager: [phone]

## 2017-11-17 NOTE — Progress Notes (Signed)
  Radiation Oncology         [phone] ________________________________  Name: Jill [name] MRN: [id number]  Date: 11/15/2017  DOB: April 24, 1961  SIMULATION AND TREATMENT PLANNING NOTE  DIAGNOSIS:     ICD-10-CM   1. Primary malignant neoplasm of bronchus of right lower lobe [facility]) C34.31      Site:  chest  NARRATIVE:  The patient was brought to the [facility].  Identity was confirmed.  All relevant records and images related to the planned course of therapy were reviewed.   Written consent to proceed with treatment was confirmed which was freely given after reviewing the details related to the planned course of therapy had been reviewed with the patient.  Then, the patient was set-up in a stable reproducible  supine position for radiation therapy.  CT images were obtained.  Surface markings were placed.    Medically necessary complex treatment device(s) for immobilization:  Customized Accuform device.   The CT images were loaded into the planning software.  Then the target and avoidance structures were contoured.  Treatment planning then occurred.  The radiation prescription was entered and confirmed.  A total of for complex treatment devices were fabricated which relate to the designed radiation treatment fields. Additional reduced fields will be used as necessary to improve the dose homogeneity of the plan. Each of these customized fields/ complex treatment devices will be used on a daily basis during the radiation course. I have requested : 3D Simulation  I have requested a DVH of the following structures: target volume, spinal cord, lungs, heart.   The patient will undergo daily image guidance to ensure accurate localization of the target, and adequate minimize dose to the normal surrounding structures in close proximity to the target.   PLAN:  The patient will receive 30 Gy in 10 fractions.    ________________________________   [name], MD, PhD

## 2017-11-18 LAB — CULTURE, BLOOD (ROUTINE X 2)
CULTURE: NO GROWTH
Culture: NO GROWTH
SPECIAL REQUESTS: ADEQUATE

## 2017-11-18 MED ORDER — IPRATROPIUM-ALBUTEROL 0.5-2.5 (3) MG/3ML IN SOLN: 3.0000 mL | RESPIRATORY_TRACT | 0 refills | Status: AC | PRN

## 2017-11-18 MED ORDER — POLYETHYLENE GLYCOL 3350 17 G PO PACK: 17.0000 g | PACK | Freq: Two times a day (BID) | ORAL | 0 refills | Status: DC

## 2017-11-18 MED ORDER — SENNOSIDES-DOCUSATE SODIUM 8.6-50 MG PO TABS: 1.0000 | ORAL_TABLET | Freq: Two times a day (BID) | ORAL | 0 refills | Status: AC

## 2017-11-18 MED ORDER — BENZONATATE 200 MG PO CAPS: 200.0000 mg | ORAL_CAPSULE | Freq: Two times a day (BID) | ORAL | 0 refills | Status: DC

## 2017-11-18 MED ORDER — OXYCODONE HCL 5 MG PO TABS: 5.0000 mg | ORAL_TABLET | Freq: Four times a day (QID) | ORAL | 0 refills | Status: DC | PRN

## 2017-11-18 MED ORDER — DEXAMETHASONE 0.5 MG PO TABS: 4.0000 mg | ORAL_TABLET | Freq: Every day | ORAL | 0 refills | Status: DC

## 2017-11-18 NOTE — Discharge Summary (Addendum)
Discharge Summary  Jill [name] QQV:[id number] DOB: Oct 24, 1960  PCP: Aletha Halim., PA-C  Admit date: 11/12/2017 Discharge date: 11/18/2017  Time spent: 25 minutes  Recommendations for Outpatient Follow-up:  1. Follow-up with oncology 2. Follow up with radiation oncology 3. Follow up with neuro oncology Dr Mickeal Skinner 4. Follow-up with PCP 5. Follow-up with palliative care team 6. Take your medications as prescribed  Discharge Diagnoses:  Active Hospital Problems   Diagnosis Date Noted  . Sepsis due to pneumonia ([place]) 11/13/2017  . Brain metastases ([place]) 11/14/2017  . Hemoptysis 11/13/2017  . Hypoxia 11/13/2017  . Metastatic lung cancer (metastasis from lung to other site), right ([place]) 11/13/2017  . Headache 11/13/2017  . TOBACCO DEPENDENCE 08/31/2006  . Schizophrenia ([place]) 08/31/2006  . HYPERTENSION, BENIGN SYSTEMIC 08/31/2006    Resolved Hospital Problems  No resolved problems to display.    Discharge Condition: Stable  Diet recommendation: Resume previous diet  Vitals:   11/17/17 2003 11/18/17 0406  BP: 111/74 117/77  Pulse: 90 86  Resp: 18 16  Temp: 98 F (36.7 C) 98.6 F (37 C)  SpO2: 92% 95%    History of present illness:  57 year old female with history of recent diagnosis of metastatic lung cancer on imaging without tissue diagnosis yet, hypertension, hyperlipidemia, schizophrenia and tobacco abuse presented with coughing up blood. CT his chest showed right-sided lung mass with worsening confluent consolidation. She was started on broad-spectrum intravenous antibiotics. Pulmonary was consulted.  11/15/17: seen and examined at bedside.  She reports moderate headache.  Headache cocktail given.  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  We will consult palliative care team for goals of care in the morning.  11/16/17: No new complaints. Radiation planned this morning.  11/17/17: Headache which  is improved with toradol. NO chest pain or dyspnea at rest. First radiation done yesterday 11/16/17. Next Radiation planned today.  11/18/2017: No acute events overnight.  No new complaints.  The day of discharge patient was hemodynamically stable.  Needs to follow-up with PCP, oncology, post hospitalization.   Hospital Course:  Principal Problem:   Sepsis due to pneumonia [facility]) Active Problems:   Schizophrenia ([place])   TOBACCO DEPENDENCE   HYPERTENSION, BENIGN SYSTEMIC   Hemoptysis   Hypoxia   Metastatic lung cancer (metastasis from lung to other site), right ([place])   Headache   Brain metastases ([facility])  NSCLC [place] to bones and brain Radiation done twice in the inpatient setting. 2/10 radiation session completed Palliative care team consulted for goals of care and symptoms management Continue follow-up with palliative care team  HCAP Completed 5 days Cefepime procalcitonin 0.10 (11/16/17) Continue breathing treatment  Cerebral edema Completed Decadron 2 mg twice daily May consider switching to p.o. as recommended by oncology Follow-up with oncology  Intractable headache most likely from brain tumor C/w decadron Spoke with Dr Mickeal Skinner on the phone on 11/18/17. He agrees to set up follow up appointment for the patient.   Schizophrenia, stable Continue Haldol IM every 30 days  HTN Blood pressure is well controlled Continue home medications  Physical debility/ambulatory dysfunction PT recommends HHPT OOB to chair Fall precaution    Procedures:  Bronchoscopy  Consultations:  Pulmonology  Discharge Exam: BP 117/77 (BP Location: Left Arm)   Pulse 86   Temp 98.6 F (37 C)   Resp 16   Ht 5\' 1"  (1.549 m)   Wt 80.3 kg (177 lb)   SpO2 95%   BMI  33.44 kg/m  . General: 57 y.o. year-old female well developed well nourished in no acute distress.  Alert and oriented x3. . Cardiovascular: Regular rate and rhythm with no rubs or gallops.  No thyromegaly or JVD  noted.   Marland Kitchen Respiratory: Clear to auscultation with no wheezes or rales. Good inspiratory effort. . Abdomen: Soft nontender nondistended with normal bowel sounds x4 quadrants. . Musculoskeletal: No lower extremity edema. 2/4 pulses in all 4 extremities. . Skin: No ulcerative lesions noted or rashes, . Psychiatry: Mood is appropriate for condition and setting  Discharge Instructions You were cared for by a hospitalist during your hospital stay. If you have any questions about your discharge medications or the care you received while you were in the hospital after you are discharged, you can call the unit and asked to speak with the hospitalist on call if the hospitalist that took care of you is not available. Once you are discharged, your primary care physician will handle any further medical issues. Please note that NO REFILLS for any discharge medications will be authorized once you are discharged, as it is imperative that you return to your primary care physician (or establish a relationship with a primary care physician if you do not have one) for your aftercare needs so that they can reassess your need for medications and monitor your lab values.  Discharge Instructions    DME Nebulizer machine   Complete by:  As directed    Patient needs a nebulizer to treat with the following condition:  Lung cancer ([place])   DME Other see comment   Complete by:  As directed    Needs 3-1 tub bench vs shower chair   Face-to-face encounter (required for Medicare/Medicaid patients)   Complete by:  As directed    I Kayleen Memos certify that this patient is under my care and that I, or a nurse practitioner or physician's assistant working with me, had a face-to-face encounter that meets the physician face-to-face encounter requirements with this patient on 11/18/2017. The encounter with the patient was in whole, or in part for the following medical condition(s) which is the primary reason for home health care (List  medical condition): Ambulatory dysfunction. Complex disease process.   The encounter with the patient was in whole, or in part, for the following medical condition, which is the primary reason for home health care:  ambulatory dysfunction   I certify that, based on my findings, the following services are medically necessary home health services:   Physical therapy Nursing     Reason for Medically Necessary Home Health Services:  Therapy- Personnel officer, Public librarian   My clinical findings support the need for the above services:  Unable to leave home safely without assistance and/or assistive device   Further, I certify that my clinical findings support that this patient is homebound due to:  Unsafe ambulation due to balance issues   Home Health   Complete by:  As directed    To provide the following care/treatments:   PT [place]       Allergies as of 11/18/2017   No Known Allergies     Medication List    STOP taking these medications   aspirin EC 81 MG tablet   aspirin-acetaminophen-caffeine 250-250-65 MG tablet Commonly known as:  EXCEDRIN MIGRAINE   HYDROcodone-acetaminophen 7.5-325 mg/15 ml solution Commonly known as:  HYCET     TAKE these medications   benzonatate 200 MG capsule  Commonly known as:  TESSALON Take 1 capsule (200 mg total) by mouth 2 (two) times daily.   dexamethasone 0.5 MG tablet Commonly known as:  DECADRON Take 8 tablets (4 mg total) by mouth daily for 7 days.   haloperidol decanoate 50 MG/ML injection Commonly known as:  HALDOL DECANOATE Inject 25 mg into the muscle every 28 (twenty-eight) days. PER [place] by Filomena Jungling [phone]   ipratropium-albuterol 0.5-2.5 (3) MG/3ML Soln Commonly known as:  DUONEB Take 3 mLs by nebulization every 4 (four) hours as needed.   lisinopril-hydrochlorothiazide 20-25 MG tablet Commonly known as:  PRINZIDE,ZESTORETIC Take 1 tablet by mouth daily.     nicotine 21 mg/24hr patch Commonly known as:  NICODERM CQ - dosed in mg/24 hours Place 21 mg onto the skin daily.   oxyCODONE 5 MG immediate release tablet Commonly known as:  Oxy IR/ROXICODONE Take 1 tablet (5 mg total) by mouth every 6 (six) hours as needed for moderate pain, severe pain or breakthrough pain.   polyethylene glycol packet Commonly known as:  MIRALAX / GLYCOLAX Take 17 g by mouth 2 (two) times daily.   promethazine 12.5 MG tablet Commonly known as:  PHENERGAN Take 12.5 mg by mouth as needed for nausea/vomiting.   rosuvastatin 20 MG tablet Commonly known as:  CRESTOR Take 20 mg by mouth daily at 6 PM.   senna-docusate 8.6-50 MG tablet Commonly known as:  Senokot-S Take 1 tablet by mouth 2 (two) times daily.            Durable Medical Equipment  (From admission, onward)        Start     Ordered   11/18/17 0000  DME Other see comment    Comments:  Needs 3-1 tub bench vs shower chair   11/18/17 1145   11/18/17 0000  DME Nebulizer machine    Question:  Patient needs a nebulizer to treat with the following condition  Answer:  Lung cancer ([place])   11/18/17 1148     No Known Allergies Follow-up Information    Aletha Halim., PA-C. Call in 2 day(s).   Specialty:  Family Medicine Why:  call to make appointment Contact information: [address] [place] [address] [phone]        Loistine Chance, MD. Call in 2 day(s).   Specialty:  Internal Medicine Contact information: [address] [address] [phone]        Curt Bears, MD. Call in 2 day(s).   Specialty:  Oncology Why:  call to make appointment Contact information: [place] [address] [phone]        Kyung Rudd, MD. Call in 2 day(s).   Specialty:  Radiation Oncology Why:  call to make appointment Contact information: [place] [place] [address] [phone]        Ventura Sellers, MD. Call in 2  day(s).   Specialties:  Psychiatry, Neurology, Oncology Why:  Please call to make an appointment on Monday 11/20/17. Contact information: [place] [address] [phone]            The results of significant diagnostics from this hospitalization (including imaging, microbiology, ancillary and laboratory) are listed below for reference.    Significant Diagnostic Studies: Dg Chest 2 View  Result Date: 10/24/2017 CLINICAL DATA:  Chronic cough, congestion, shortness of Breath EXAM: CHEST - 2 VIEW COMPARISON:  12/23/2015 FINDINGS: Consolidation noted in the right lower lobe  compatible with pneumonia. Possible early infiltrate in the left lower lobe versus atelectasis. Heart is normal size. No effusions or acute bony abnormality. IMPRESSION: Right lower lobe pneumonia. Early left basilar infiltrate versus atelectasis. Electronically Signed   By: Rolm Baptise M.D.   On: 10/24/2017 09:27   Ct Angio Chest Pe W And/or Wo Contrast  Result Date: 11/12/2017 CLINICAL DATA:  Hemoptysis lung tumor EXAM: CT ANGIOGRAPHY CHEST WITH CONTRAST TECHNIQUE: Multidetector CT imaging of the chest was performed using the standard protocol during bolus administration of intravenous contrast. Multiplanar CT image reconstructions and MIPs were obtained to evaluate the vascular anatomy. CONTRAST:  100 cc Isovue 370 intravenous COMPARISON:  Chest x-ray 10/24/2017, CT chest 10/24/2017, PET-CT 11/08/2017 FINDINGS: Cardiovascular: Significant respiratory motion limits evaluation for pulmonary emboli, mostly within the lower lobes. No acute central embolus is seen. Nonaneurysmal aorta. No dissection. Moderate aortic atherosclerosis. Coronary vascular calcification. Normal heart size. No pericardial effusion Mediastinum/Nodes: Midline trachea. Coarse calcification right lobe of thyroid. Enlarged mediastinal lymph nodes. Right paratracheal lymph node measures 11 mm. Enlarged right hilar lymph nodes measuring 14  mm. Esophagus within normal limits. Lungs/Pleura: Right infrahilar lung mass measuring approximately 3.5 by 3.1 cm. Right middle lobe lung nodule measuring 18 mm. Mild emphysema. Right lung base mass now confluent with adjacent consolidation making it difficult to measure. Upper Abdomen: No acute abnormality. Stone in the mid pole of left kidney. Musculoskeletal: Left second rib lesion on PET-CT not well demonstrated. No additional suspicious bony findings Review of the MIP images confirms the above findings. IMPRESSION: 1. Limited study secondary to respiratory motion artifact particularly at the lung bases. No definite acute central PE is seen 2. Similar size and appearance of right infrahilar lung mass. Additional mass in the right lower lobe has now become confluent with consolidation in the right lower lobe, making it difficult to measure. Enlarged mediastinal and right hilar lymph nodes as before. 3. Stable right middle lobe 18 mm lung nodule, this is noted to be PET negative 4. Stone in the left kidney Aortic Atherosclerosis (ICD10-I70.0) and Emphysema (ICD10-J43.9). Electronically Signed   By: Donavan Foil M.D.   On: 11/12/2017 23:46   Mr Jeri Cos MW Contrast  Result Date: 11/08/2017 CLINICAL DATA:  57 year old female undergoing evaluation of suspected stage IIIA primary bronchogenic carcinoma. EXAM: MRI HEAD WITHOUT AND WITH CONTRAST TECHNIQUE: Multiplanar, multiecho pulse sequences of the brain and surrounding structures were obtained without and with intravenous contrast. CONTRAST:  68mL MULTIHANCE GADOBENATE DIMEGLUMINE 529 MG/ML IV SOLN COMPARISON:  PET-CT today reported separately. FINDINGS: Brain: Situated along the left parieto-occipital sulcus in the medial posterior left hemisphere there is an 11 millimeter mildly lobulated enhancing mass with surrounding T2 and FLAIR hyperintensity compatible with vasogenic edema. See series 12, image 35 and series 7, image 16. Minimal regional mass effect. No  associated hemosiderin. There is perhaps mild associated linear leptomeningeal thickening and enhancement (series 12, image 36). No other abnormal enhancing brain lesion is identified. No other abnormal dural thickening or enhancement. No restricted diffusion to suggest acute infarction. No midline shift, ventriculomegaly, extra-axial collection or acute intracranial hemorrhage. Cervicomedullary junction and pituitary are within normal limits. Scattered small nonspecific nonenhancing mostly subcortical cerebral white matter T2 and FLAIR hyperintense foci bilaterally. No cortical encephalomalacia or chronic cerebral blood products identified. Vascular: Major intracranial vascular flow voids are preserved. The major dural venous sinuses are enhancing and appear patent. Skull and upper cervical spine: Normal visible cervical spine and spinal cord. Visualized bone marrow signal is  within normal limits. Sinuses/Orbits: Normal orbits soft tissues. Paranasal sinuses are well pneumatized. Other: Mastoid air cells are clear. Visible internal auditory structures appear normal. Scalp and face soft tissues appear negative. IMPRESSION: 1. Positive for early metastatic disease to the brain. There is a solitary 11 mm enhancing mass situated in the left parieto-occipital sulcus with mild regional edema. There may be associated focal leptomeningeal involvement (series 12, image 36). 2. No other metastatic disease or acute intracranial abnormality. Electronically Signed   By: Genevie Ann M.D.   On: 11/08/2017 10:55   Nm Pet Image Initial (pi) Skull Base To Thigh  Result Date: 11/08/2017 CLINICAL DATA:  Initial treatment strategy for right infrahilar mass and mediastinal adenopathy on lung cancer screening chest CT, presumed lung cancer. EXAM: NUCLEAR MEDICINE PET SKULL BASE TO THIGH TECHNIQUE: 9.0 mCi F-18 FDG was injected intravenously. Full-ring PET imaging was performed from the skull base to thigh after the radiotracer. CT data  was obtained and used for attenuation correction and anatomic localization. Fasting blood glucose: 97 mg/dl COMPARISON:  Chest CT 10/24/2017 FINDINGS: Mediastinal blood pool activity: SUV max 2.2 NECK: No hypermetabolic cervical lymph nodes are identified.There are no lesions of the pharyngeal mucosal space. Physiologic activity associated with the muscles of phonation. Incidental CT findings: Bilateral carotid atherosclerosis. CHEST: 4.6 x 3.2 cm right hilar mass (image 79/4) is markedly hypermetabolic with an SUV max of 52. There is a hypermetabolic peripheral right lower lobe mass as well with intervening postobstructive pneumonitis which has mildly improved. This mass measures approximately 3.5 x 3.0 cm on image 47/8 and has an SUV max of 20.7. The 1.8 x 1.3 cm right middle lobe nodule on image 31/8 demonstrates no hypermetabolic activity. There is no suspicious metabolic activity within the left lung. There are small hypermetabolic mediastinal and right hilar lymph nodes. In particular, there is an 11 mm subcarinal node on image 75/4 which has an SUV max of 29.8. There is no contralateral mediastinal or hilar adenopathy. Low level hypermetabolic activity is also demonstrated within a small right axillary node (SUV max 1.4) and superficially in the posterior right shoulder region (SUV max 2.6). Incidental CT findings: Diffuse atherosclerosis of the aorta, great vessels and coronary arteries. No pleural or pericardial effusion. ABDOMEN/PELVIS: There is no hypermetabolic activity within the liver, adrenal glands, spleen or pancreas. There is no hypermetabolic nodal activity. Incidental CT findings: There are small nonobstructing bilateral renal calculi. Also demonstrated are a duodenal diverticulum, a supraumbilical hernia containing only fat and diffuse aortic and branch vessel atherosclerosis. SKELETON: There are several hypermetabolic osseous lesions consistent with metastatic disease. These include a lesion  laterally in the left 2nd rib (SUV max 13.1), a 16 mm with sclerotic margins in the L5 vertebral body (image 140/4, SUV max 22.4), and a small hypermetabolic lesion in the intertrochanteric region of the left femur (SUV max 7.0). There are other smaller lesions. No pathologic fractures are seen. Incidental CT findings: Spondylosis in the lower lumbar spine with mild hypermetabolic activity associated with the L4-5 facet joints. IMPRESSION: 1. The large right lower lobe mass and the associated mediastinal and right hilar adenopathy are hypermetabolic, consistent with lung cancer. In addition, there are several hypermetabolic osseous lesions consistent with metastatic disease. Stage IV disease, [id number]. 2. Slight improvement in postobstructive pneumonitis in the right lower lobe. 3. The right middle lobe nodule does not demonstrate hypermetabolic activity. 4. Incidental findings including bilateral nephrolithiasis and Aortic Atherosclerosis (ICD10-I70.0). Electronically Signed   By: Caryl Comes.D.  On: 11/08/2017 15:11   Ct Chest Lung Ca Screen Low Dose W/o Cm  Result Date: 10/24/2017 CLINICAL DATA:  57 year old female with 35 pack year history of smoking. Lung cancer screening. EXAM: CT CHEST WITHOUT CONTRAST LOW-DOSE FOR LUNG CANCER SCREENING TECHNIQUE: Multidetector CT imaging of the chest was performed following the standard protocol without IV contrast. COMPARISON:  None. FINDINGS: Cardiovascular: The heart size is normal. No pericardial effusion. Coronary artery calcification is evident. Atherosclerotic calcification is noted in the wall of the thoracic aorta. Ascending thoracic aorta Mediastinum/Nodes: Low right paratracheal lymph node measures 12 mm short axis. 12 mm short axis subcarinal lymph node evident. 6 mm short axis lymph node identified in the right thoracic inlet (image 10/series 2). There is no axillary lymphadenopathy. The esophagus has normal imaging features. Lungs/Pleura: 3.8 x 5.4  cm lesion identified in the right infrahilar region, generating substantial mass-effect on the right middle lobe bronchus and obliterating central airway to the right lower lobe. 1.1 x 1.7 cm central right middle lobe pulmonary nodule is identified on image 157 of series 3. There is confluent airspace opacity in the posterior right lung which may be neoplastic or postobstructive in etiology. Centrilobular emphysema evident. Atelectasis/scarring noted in the right middle lobe and lingula. Calcified granuloma identified in the left lower lobe near the major fissure. Upper Abdomen: 12 mm left adrenal nodule has an average attenuation of 1 Hounsfield units suggesting adenoma. Right adrenal gland unremarkable. Nonobstructing renal stones are evident bilaterally. Musculoskeletal: Bone windows reveal no worrisome lytic or sclerotic osseous lesions. IMPRESSION: 1. 5.4 cm right infrahilar mass obliterates right lower lobe bronchus and attenuates the right middle lobe airway. This is associated with mediastinal lymphadenopathy and a 1.7 cm nodule in the central right middle lobe concerning for metastatic involvement. Confluent airspace opacity in the posterior right lower lobe may be neoplastic and/or postobstructive. Lung-RADS 4B, highly suspicious for primary neoplasm with metastatic disease. Additional imaging evaluation or consultation with Pulmonology or Thoracic Surgery recommended. These results will be called to the ordering clinician or representative by the Radiologist Assistant, and communication documented in the PACS or zVision Dashboard. Electronically Signed   By: Misty Stanley M.D.   On: 10/24/2017 16:48    Microbiology: Recent Results (from the past 240 hour(s))  Blood culture (routine x 2)     Status: None (Preliminary result)   Collection Time: 11/12/17  9:18 PM  Result Value Ref Range Status   Specimen Description   Final    BLOOD RIGHT ANTECUBITAL Performed at [facility] [address], [place], [place] [address]    Special Requests   Final    BOTTLES DRAWN AEROBIC AND ANAEROBIC Blood Culture results may not be optimal due to an excessive volume of blood received in culture bottles Performed at [facility] [address], [place], [place] [address]    Culture   Final    NO GROWTH 4 DAYS Performed at [facility], [place] [address] [place], [place] [address]    Report Status PENDING  Incomplete  Blood culture (routine x 2)     Status: None (Preliminary result)   Collection Time: 11/12/17 10:35 PM  Result Value Ref Range Status   Specimen Description   Final    BLOOD LEFT WRIST Performed at [facility], [place] [address] [place], [place] [address]    Special Requests   Final    BOTTLES DRAWN AEROBIC AND ANAEROBIC Blood Culture adequate volume Performed  at [facility], [place] [address] [place], [place] [address]    Culture   Final    NO GROWTH 4 DAYS Performed at [facility], [place] [address], [place], [place] [address]    Report Status PENDING  Incomplete  MRSA PCR Screening     Status: None   Collection Time: 11/13/17  4:07 PM  Result Value Ref Range Status   MRSA by PCR NEGATIVE NEGATIVE Final    Comment:        The GeneXpert MRSA Assay (FDA approved for NASAL specimens only), is one component of a comprehensive MRSA colonization surveillance program. It is not intended to diagnose MRSA infection nor to guide or monitor treatment for MRSA infections. Performed at [facility], [place] [address] [place], [place] [address]      Labs: Basic Metabolic Panel: Recent Labs  Lab 11/12/17 2117 11/13/17 0550 11/16/17 0343  NA 139 138 140  K 4.0 3.9 4.2  CL 104 108 105  CO2 22 21* 24  GLUCOSE 97 100* 135*  BUN 21* 14 17  CREATININE 0.74 0.60 0.62  CALCIUM 10.1 9.6 9.9   Liver Function Tests: Recent Labs  Lab 11/12/17 2117  AST 20  ALT 18  ALKPHOS 74    BILITOT 0.8  PROT 7.4  ALBUMIN 4.2   Recent Labs  Lab 11/12/17 2116  LIPASE 45   No results for input(s): AMMONIA in the last 168 hours. CBC: Recent Labs  Lab 11/12/17 2117 11/13/17 0550 11/16/17 0343 11/17/17 0822  WBC 12.4* 9.8 12.3* 8.2  HGB 13.9 12.3 11.8* 12.6  HCT 43.1 38.2 37.0 38.6  MCV 88.3 88.4 88.3 87.3  PLT 240 199 239 216   Cardiac Enzymes: No results for input(s): CKTOTAL, CKMB, CKMBINDEX, TROPONINI in the last 168 hours. BNP: BNP (last 3 results) No results for input(s): BNP in the last 8760 hours.  ProBNP (last 3 results) No results for input(s): PROBNP in the last 8760 hours.  CBG: No results for input(s): GLUCAP in the last 168 hours.     Signed:  Kayleen Memos, MD [facility] 11/18/2017, 11:49 AM

## 2017-11-18 NOTE — Progress Notes (Signed)
PMT progress note  Patient seen in hallway, dressed and eager for discharge, also met with patient's brother who is here to pick her up, he will also be transporting her to radiation appointments.   BP 117/77 (BP Location: Left Arm)   Pulse 86   Temp 98.6 F (37 C)   Resp 16   Ht '5\' 1"'  (1.549 m)   Wt 80.3 kg (177 lb)   SpO2 95%   BMI 33.44 kg/m  Awake alert No distress No edema Regular pattern of breathing noted.   Also discussed with TRH MD.  D/C med list reviewed, agree with PO steroids, also agree with PO PRN Oxy IR, also has bowel regimen and anti emetic regimen.   No additional PMT specific recommendation at this time. Patient will follow up with her med oncologist after discharge, she will also complete her radiation treatments. She remains in good spirits.   15 minutes spent [place] [facility] palliative medicine team [phone]

## 2017-11-18 NOTE — Discharge Instructions (Signed)
Metastatic Cancer What increases the risk? Your risk for metastatic cancer depends on:  The type of cancer that you have.  The stage and grade of your primary cancer at the time of diagnosis.  Tumors are graded by looking at tumor cells under a microscope. Grading predicts how quickly the tumor cells will grow. Your health care provider will use the stage and the grade of your primary cancer to determine the chances of metastasis. This helps your health care provider to find the best treatment for you.  Risk for metastasis may go up with:  A larger primary tumor.  A higher grade of tumor.  Deeper growth of tumor.  Lymph node involvement.  How is this diagnosed? Your health care provider may suspect metastatic cancer from your signs and symptoms. Some people do not have any symptoms. Their cancer is found through imaging or other tests.  Symptoms may include: ? Weakness. ? Lack of energy. ? Pain. ? Weight loss. ? Trouble breathing.  Signs may include: ? Fluid buildup in your lungs or belly. ? Tumor growths that can be felt or seen. ? An enlarged liver.  Your health care provider will also do a physical exam. This may include: How is this treated? There are many options for treating metastatic cancer. Your treatment will depend on:  The type of cancer that you have.  How far your cancer has advanced.  Your general health.  Treatment may not be able to cure metastatic cancer, but it can often relieve the symptoms. In many cases, you may have a combination of treatments. Options may include:  Surgery.  Cancer-killing drugs (chemotherapy).  X-ray treatment (radiation therapy).  Hormone therapy.  Treatments that help your body to fight cancer (biologic therapy).  How is this prevented? The only way to prevent metastatic cancer is to find your primary cancer early and treat it successfully. Talk with your health care provider about cancer screening. Screening exams  for early detection are available for some types of cancer, including:  Breast.  Colon.  Prostate.  Lung.  Cervical.  Where to find more information: The following websites provide more information.  [place] ([place]) http://www.hicks.com/  [place] (ACS) http://www.cancer.org/treatment/understandingyourdiagnosis/advancedcancer/advanced-cancer-what-is-metastatic  This information is not intended to replace advice given to you by your health care provider. Make sure you discuss any questions you have with your health care provider. Document Released: 10/25/2004 Document Revised: 01/07/2016 Document Reviewed: 10/03/2013 Elsevier Interactive Patient Education  2017 [employer].

## 2017-11-18 NOTE — Care Management Note (Addendum)
Case Management Note  Patient Details  Name: JAICE LAGUE MRN: [id number] Date of Birth: 11-Jun-1961  Subjective/Objective:      PNA, Lung Cancer with mets bones, brain            Action/Plan: NCM spoke to pt and brother at bedside. Offered choice for HH/list provided. [facility], PT and aide. Contacted [facility] for [place] and neb machine for home.   Expected Discharge Date:  11/18/17               Expected Discharge Plan:  [place]  In-House Referral:  NA  Discharge planning Services  CM Consult  Post Acute Care Choice:  Home Health Choice offered to:     DME Arranged:  Nebulizer machine DME Agency:  [place] Arranged:  RN, PT, Nurse's Aide [facility] Agency:  [place]  Status of Service:  Completed, signed off  If discussed at [place] of Stay Meetings, dates discussed:    Additional Comments:  Erenest Rasher, RN 11/18/2017, 12:41 PM

## 2017-11-18 NOTE — Care Management Important Message (Signed)
Important Message  Patient Details  Name: Jill [name] MRN: [id number] Date of Birth: 08-19-1960   Medicare Important Message Given:  Yes    Erenest Rasher, RN 11/18/2017, 12:41 PM

## 2017-11-18 NOTE — Progress Notes (Signed)
Patient discharged to home with family, discharge instructions reviewed with patient who verbalized understanding. New RX's given to patient. Patient instructed in use of nebulizer from [place] care.

## 2017-11-20 ENCOUNTER — Telehealth: Payer: Self-pay | Admitting: Radiation Oncology

## 2017-11-20 ENCOUNTER — Telehealth: Payer: Self-pay | Admitting: *Deleted

## 2017-11-20 ENCOUNTER — Ambulatory Visit
Admission: RE | Admit: 2017-11-20 | Discharge: 2017-11-20 | Disposition: A | Discharge status: Home / Self Care | Payer: Medicare Other | Source: Ambulatory Visit | Attending: Radiation Oncology | Admitting: Radiation Oncology

## 2017-11-20 DIAGNOSIS — Z510 Encounter for antineoplastic radiation therapy: Secondary | ICD-10-CM | POA: Diagnosis not present

## 2017-11-20 DIAGNOSIS — C3431 Malignant neoplasm of lower lobe, right bronchus or lung: Secondary | ICD-10-CM | POA: Diagnosis not present

## 2017-11-20 NOTE — Telephone Encounter (Signed)
Phoned patient. Explained that Shona Simpson, PA_C plans to refill her pain medication and once its been done this RN will phone her with an update. In addition, explained she has an appointment with Curt Bears on 11/23/2017 at 1200 just before her radiation treatment. Patient verbalized understanding and expressed appreciation for the help.

## 2017-11-20 NOTE — Telephone Encounter (Signed)
CALLED PATIENT TO INFORM OF NUTRITION APPT. FOR 11-21-17 - @ 1:45 PM WITH JOLI ALLEN, SPOKE WITH PATIENT AND SHE IS AWARE OF THIS APPT.

## 2017-11-20 NOTE — Progress Notes (Signed)
Received patient in the clinic following initial radiation therapy. Patient reports waiting up at 2 am with a fluid of questions so she wrote them down. Following treatment she request to see a nurse to obtain answers to her questions. Patient reports she has 8 oxycodone tablets left from the prescription she was given in the emergency room. She request a refill be sent to [facility]. Explained that Shona Simpson, PA-C would feel her pain script so long as she was actively receiving radiation therapy. Patient reports the pain medication help relieve her pain but makes her nauseated. Encouraged patient to take nausea as prescribed and ensure she has eaten before taking her pain medication. Patient request to know her diagnosis and code to take to the pharmacy so she could obtain her Duonebs script. Provided her with appropriate documentation. Also, patient inquires if she can take tylenol for her headaches. Explained that per Shona Simpson, PA-C it is ok to take Tylenol until she sees Dr. Mickeal Skinner but ultimately she needs to follow his direction. Patient request to be seen by nutritionist. Patient understands Enid Derry will call her with appointment. Also, per Alison's request I will inquire from [facility] when the patient will see Dr. Julien Nordmann.

## 2017-11-21 ENCOUNTER — Telehealth: Payer: Self-pay | Admitting: Internal Medicine

## 2017-11-21 ENCOUNTER — Other Ambulatory Visit: Payer: Self-pay | Admitting: Radiation Oncology

## 2017-11-21 ENCOUNTER — Encounter: Payer: Self-pay | Admitting: Internal Medicine

## 2017-11-21 ENCOUNTER — Inpatient Hospital Stay: Payer: Medicare Other | Attending: Internal Medicine | Admitting: Internal Medicine

## 2017-11-21 ENCOUNTER — Inpatient Hospital Stay: Payer: Medicare Other

## 2017-11-21 ENCOUNTER — Ambulatory Visit
Admission: RE | Admit: 2017-11-21 | Discharge: 2017-11-21 | Disposition: A | Discharge status: Home / Self Care | Payer: Medicare Other | Source: Ambulatory Visit | Attending: Radiation Oncology | Admitting: Radiation Oncology

## 2017-11-21 VITALS — BP 113/83 | HR 86 | Temp 99.3°F | Resp 18 | Ht 61.0 in | Wt 174.1 lb

## 2017-11-21 DIAGNOSIS — F17210 Nicotine dependence, cigarettes, uncomplicated: Secondary | ICD-10-CM | POA: Insufficient documentation

## 2017-11-21 DIAGNOSIS — G43709 Chronic migraine without aura, not intractable, without status migrainosus: Secondary | ICD-10-CM

## 2017-11-21 DIAGNOSIS — F209 Schizophrenia, unspecified: Secondary | ICD-10-CM | POA: Diagnosis not present

## 2017-11-21 DIAGNOSIS — I10 Essential (primary) hypertension: Secondary | ICD-10-CM | POA: Diagnosis not present

## 2017-11-21 DIAGNOSIS — C3431 Malignant neoplasm of lower lobe, right bronchus or lung: Secondary | ICD-10-CM

## 2017-11-21 DIAGNOSIS — C7931 Secondary malignant neoplasm of brain: Secondary | ICD-10-CM | POA: Diagnosis not present

## 2017-11-21 DIAGNOSIS — R112 Nausea with vomiting, unspecified: Secondary | ICD-10-CM | POA: Diagnosis not present

## 2017-11-21 DIAGNOSIS — Z79899 Other long term (current) drug therapy: Secondary | ICD-10-CM | POA: Diagnosis not present

## 2017-11-21 DIAGNOSIS — J439 Emphysema, unspecified: Secondary | ICD-10-CM | POA: Diagnosis not present

## 2017-11-21 DIAGNOSIS — Z510 Encounter for antineoplastic radiation therapy: Secondary | ICD-10-CM | POA: Diagnosis not present

## 2017-11-21 DIAGNOSIS — I700 Atherosclerosis of aorta: Secondary | ICD-10-CM | POA: Insufficient documentation

## 2017-11-21 MED ORDER — ONDANSETRON HCL 8 MG PO TABS: 8.0000 mg | ORAL_TABLET | Freq: Three times a day (TID) | ORAL | 0 refills | Status: DC | PRN

## 2017-11-21 MED ORDER — DEXAMETHASONE 4 MG PO TABS: 4.0000 mg | ORAL_TABLET | Freq: Every day | ORAL | 0 refills | Status: DC

## 2017-11-21 MED ORDER — AMITRIPTYLINE HCL 50 MG PO TABS: 50.0000 mg | ORAL_TABLET | Freq: Every day | ORAL | 3 refills | Status: DC

## 2017-11-21 MED ORDER — OXYCODONE HCL 5 MG PO TABS: 5.0000 mg | ORAL_TABLET | Freq: Four times a day (QID) | ORAL | 0 refills | Status: DC | PRN

## 2017-11-21 NOTE — Telephone Encounter (Signed)
Scheduled apt per 5/21 los - Gave patient aVS and calender per los.

## 2017-11-21 NOTE — Telephone Encounter (Signed)
Left message that pain medication has been escribed as requested.

## 2017-11-21 NOTE — Telephone Encounter (Signed)
Done its at her [facility]

## 2017-11-21 NOTE — Progress Notes (Signed)
Nutrition  Patient was a no show for nutrition appointment today.  Jaszmine Navejas B. Zenia Resides, [place], [place] Registered Dietitian [phone] (pager)

## 2017-11-21 NOTE — Progress Notes (Signed)
[place] [facility] [place] [address], [place] [address] [phone]   New Patient Evaluation  Date of Service: 11/21/17 Patient Name: Jill [name] Patient MRN: [id number] Patient DOB: [birth year] Provider: Ventura Sellers, MD  Identifying Statement:  Jill [name] is a 57 y.o. female with Chronic migraine without aura without status migrainosus, not intractable [G43.709] who presents for initial consultation and evaluation regarding cancer associated neurologic deficits.    Referring Provider: Aletha Halim., PA-C [address] [place] [address]  Primary Cancer: [facility] Lung Cancer Stage IV   History of Present Illness: The patient's records from the referring physician were obtained and reviewed and the patient interviewed to confirm this HPI.  Jill [name] presents today to assess her recent headache syndrome.  She describes daily right sided throbbing behind her eye, 8+/10, lasting for hours, with radiation down the face, associated with light sensitivity and nausea/vomiting.  Headaches started 5 months ago in milder form and have evolved in frequency and severity.  She has been taking round the clock analgesia for the 5 month interval, starting with Excedrin or Tylenol, and now 4-6x per day Oxycodone as of the past ~2 weeks.  Her nausea and vomiting have worsened with the opiate, and she has not taken anti-emetic because she is concerned it caused recent bout of hemoptysis. She has also cut down caffeine intake from 2 cups of coffee per day to zero (abruptly) because of the nausea.  Sleep has also been very poor, as she is kept awake at night by tv and head discomfort.   For lung cancer, she has upcoming appointment with Dr. Julien Nordmann for treatment planning.  Currently she is undergoing chest RT with Dr. Lisbeth Renshaw with plans for [facility] to [facility] small brain met in near future.  Currently taking 0.68m decadron daily after receiving high doses  while inpatient last week.        Medications: Current Outpatient Medications on File Prior to Visit  Medication Sig Dispense Refill  . acetaminophen (TYLENOL) 500 MG tablet Take 1,000 mg by mouth every 6 (six) hours as needed.    . benzonatate (TESSALON) 200 MG capsule Take 1 capsule (200 mg total) by mouth 2 (two) times daily. 30 capsule 0  . haloperidol decanoate (HALDOL DECANOATE) 50 MG/ML injection Inject 25 mg into the muscle every 28 (twenty-eight) days. PWhitesideby AFilomena [phone]    . ipratropium-albuterol (DUONEB) 0.5-2.5 (3) MG/3ML SOLN Take 3 mLs by nebulization every 4 (four) hours as needed. 360 mL 0  . lisinopril-hydrochlorothiazide (PRINZIDE,ZESTORETIC) 20-25 MG tablet Take 1 tablet by mouth daily.    . nicotine (NICODERM CQ - DOSED IN MG/24 HOURS) 21 mg/24hr patch Place 21 mg onto the skin daily.    . rosuvastatin (CRESTOR) 20 MG tablet Take 20 mg by mouth daily at 6 PM.    . senna-docusate (SENOKOT-S) 8.6-50 MG tablet Take 1 tablet by mouth 2 (two) times daily. 30 tablet 0  . polyethylene glycol (MIRALAX / GLYCOLAX) packet Take 17 g by mouth 2 (two) times daily. (Patient not taking: Reported on 11/21/2017) 14 each 0  . promethazine (PHENERGAN) 12.5 MG tablet Take 12.5 mg by mouth as needed for nausea/vomiting.     No current facility-administered medications on file prior to visit.     Allergies:  Allergies  Allergen Reactions  . Morphine And Related     Headaches, Vomiting  . Prednisone  Dizziness and pain   Past Medical History:  Past Medical History:  Diagnosis Date  . Cancer ([employer])   . Hypertension   . Schizophrenia [facility])    Past Surgical History:  Past Surgical History:  Procedure Laterality Date  . HERNIA REPAIR    . KIDNEY SURGERY    . VIDEO BRONCHOSCOPY Bilateral 11/14/2017   Procedure: VIDEO BRONCHOSCOPY WITH FLUORO;  Surgeon: Chesley Mires, MD;  Location: [facility];  Service: Endoscopy;  Laterality: Bilateral;    Social History:  Social History   Socioeconomic History  . Marital status: Single    Spouse name: Not on file  . Number of children: Not on file  . Years of education: Not on file  . Highest education level: Not on file  Occupational History  . Not on file  Social Needs  . Financial resource strain: Not on file  . Food insecurity:    Worry: Not on file    Inability: Not on file  . Transportation needs:    Medical: Not on file    Non-medical: Not on file  Tobacco Use  . Smoking status: Current Every Day Smoker    Packs/day: 2.00    Years: 36.00    Pack years: 72.00    Types: Cigarettes    Start date: 10/31/1981  . Smokeless tobacco: Never Used  . Tobacco comment: HAS CALLED QUIT 4 FREE ALREADY  Substance and Sexual Activity  . Alcohol use: Never    Frequency: Never  . Drug use: Never  . Sexual activity: Not on file  Lifestyle  . Physical activity:    Days per week: Not on file    Minutes per session: Not on file  . Stress: Not on file  Relationships  . Social connections:    Talks on phone: Not on file    Gets together: Not on file    Attends religious service: Not on file    Active member of club or organization: Not on file    Attends meetings of clubs or organizations: Not on file    Relationship status: Not on file  . Intimate partner violence:    Fear of current or ex partner: Not on file    Emotionally abused: Not on file    Physically abused: Not on file    Forced sexual activity: Not on file  Other Topics Concern  . Not on file  Social History Narrative  . Not on file   Family History: History reviewed. No pertinent family history.  Review of Systems: Constitutional: Denies fevers, chills or abnormal weight loss Eyes: Denies blurriness of vision Ears, nose, mouth, throat, and face: Denies mucositis or sore throat Respiratory: +cough, dyspnea, hemoptysis Cardiovascular: Denies palpitation, chest discomfort or lower extremity  swelling Gastrointestinal:  Denies nausea, constipation, diarrhea GU: Denies dysuria or incontinence Skin: Denies abnormal skin rashes Neurological: Per HPI Musculoskeletal: Denies joint pain, back or neck discomfort. No decrease in ROM Behavioral/Psych: +anxiety  Physical Exam: Vitals:   11/21/17 1111  BP: 113/83  Pulse: 86  Resp: 18  Temp: 99.3 F (37.4 C)  SpO2: 97%   KPS: 80. General: Alert, cooperative, pleasant, in no acute distress Head: Normal EENT: No conjunctival injection or scleral icterus. Oral mucosa moist Lungs: Tachypnic Cardiac: Regular rate and rhythm Abdomen: Soft, non-distended abdomen Skin: No rashes cyanosis or petechiae. Extremities: No clubbing or edema  Neurologic Exam: Mental Status: Awake, alert, attentive to examiner. Oriented to self and environment. Language is fluent with intact comprehension.  Cranial Nerves: Visual acuity is grossly normal. Visual fields are full. Extra-ocular movements intact. No ptosis. Face is symmetric, tongue midline. Motor: Tone and bulk are normal. Power is full in both arms and legs. Reflexes are symmetric, no pathologic reflexes present. Intact finger to nose bilaterally Sensory: Intact to light touch and temperature Gait: Normal and tandem gait is normal.   Labs: I have reviewed the data as listed    Component Value Date/Time   NA 140 11/16/2017 0343   K 4.2 11/16/2017 0343   CL 105 11/16/2017 0343   CO2 24 11/16/2017 0343   GLUCOSE 135 (H) 11/16/2017 0343   BUN 17 11/16/2017 0343   CREATININE 0.62 11/16/2017 0343   CALCIUM 9.9 11/16/2017 0343   PROT 7.4 11/12/2017 2117   ALBUMIN 4.2 11/12/2017 2117   AST 20 11/12/2017 2117   ALT 18 11/12/2017 2117   ALKPHOS 74 11/12/2017 2117   BILITOT 0.8 11/12/2017 2117   GFRNONAA >60 11/16/2017 0343   GFRAA >60 11/16/2017 0343   Lab Results  Component Value Date   WBC 8.2 11/17/2017   HGB 12.6 11/17/2017   HCT 38.6 11/17/2017   MCV 87.3 11/17/2017   PLT 216  11/17/2017    Imaging:  CLINICAL DATA:  57 year old female undergoing evaluation of suspected stage IIIA primary bronchogenic carcinoma.  EXAM: MRI HEAD WITHOUT AND WITH CONTRAST  TECHNIQUE: Multiplanar, multiecho pulse sequences of the brain and surrounding structures were obtained without and with intravenous contrast.  CONTRAST:  72m MULTIHANCE GADOBENATE DIMEGLUMINE 529 MG/ML IV SOLN  COMPARISON:  PET-CT today reported separately.  FINDINGS: Brain: Situated along the left parieto-occipital sulcus in the medial posterior left hemisphere there is an 11 millimeter mildly lobulated enhancing mass with surrounding T2 and FLAIR hyperintensity compatible with vasogenic edema. See series 12, image 35 and series 7, image 16. Minimal regional mass effect. No associated hemosiderin. There is perhaps mild associated linear leptomeningeal thickening and enhancement (series 12, image 36).  No other abnormal enhancing brain lesion is identified. No other abnormal dural thickening or enhancement.  No restricted diffusion to suggest acute infarction. No midline shift, ventriculomegaly, extra-axial collection or acute intracranial hemorrhage. Cervicomedullary junction and pituitary are within normal limits. Scattered small nonspecific nonenhancing mostly subcortical cerebral white matter T2 and FLAIR hyperintense foci bilaterally. No cortical encephalomalacia or chronic cerebral blood products identified.  Vascular: Major intracranial vascular flow voids are preserved. The major dural venous sinuses are enhancing and appear patent.  Skull and upper cervical spine: Normal visible cervical spine and spinal cord. Visualized bone marrow signal is within normal limits.  Sinuses/Orbits: Normal orbits soft tissues. Paranasal sinuses are well pneumatized.  Other: Mastoid air cells are clear. Visible internal auditory structures appear normal. Scalp and face soft tissues  appear negative.  IMPRESSION: 1. Positive for early metastatic disease to the brain. There is a solitary 11 mm enhancing mass situated in the left parieto-occipital sulcus with mild regional edema. There may be associated focal leptomeningeal involvement (series 12, image 36).  2. No other metastatic disease or acute intracranial abnormality.   Electronically Signed   By: HGenevie AnnM.D.   On: 11/08/2017 10:55  Assessment/Plan 1. Chronic migraine without aura without status migrainosus, not intractable  2. Brain metastases ([facility]  Ms. RMaclinhas a migrainous headache syndrome which has been exacerbated, if not precipitated, by lifestyle choices.  The main concern is clear overuse of analgesia over several months which has led to chronic daily rebound headache phenomena.  In addition,  her poor sleep hygiene, smoking, lack of recent optometric evaluation, high intake of caffeine are also contributing factors.  The small metastasis which is visualized on recent brain MRI is unlikely to be underlying driving cause of the headaches, especially given that these symptoms have been present for almost 6 months, well before this tumor would have appeared.    We recommended very slowly weaning from oxycodone.  This will be very difficult given her persistent symptoms and dependence on analgesia, concurrent with active cancer treatment such as radiation and potentially chemotherapy.  To help this along, and in light of her recent exposure to steroids, we recommended increasing decadron to 50m daily for the time being.  She can continue to take anaglesia (either oxycodone or tylenol) as needed but will try to decrease frequency over next 2 weeks.    For nausea/vomiting, will prescribe 81mzofran q6 PRN. In addition we recommended initiating headache prophylaxis with night Elavil 5037m We counseled her on side effects including drowsiness, dry mouth, constipation.      We also counseled on sleep  hygiene and recommended optometric evaluation.  Caffeine also should not be stopped so abruptly- although 2 cups is likely too much.    Currently wearing nicotine patch and not smoking at this time.  Pending evaluation by Dr. MohJulien Nordmann determine treatment plan for her lung cancer.  We spent twenty additional minutes teaching regarding the natural history, biology, and historical experience in the treatment of neurologic complications of cancer. We also provided teaching sheets for the patient to take home as an additional resource.  We appreciate the opportunity to participate in the care of Jill [name]  She should return in 2 weeks to continue titration of headache medications and prevent need for inpatient treatment given the volume of recommendations today.     All questions were answered. The patient knows to call the clinic with any problems, questions or concerns. No barriers to learning were detected.  The total time spent in the encounter was 45 minutes and more than 50% was on counseling and review of test results   ZacVentura SellersD Medical Director of Neuro-Oncology [facility] [place] [place]/21/19 2:52 PM

## 2017-11-22 ENCOUNTER — Ambulatory Visit
Admission: RE | Admit: 2017-11-22 | Discharge: 2017-11-22 | Disposition: A | Discharge status: Home / Self Care | Payer: Medicare Other | Source: Ambulatory Visit | Attending: Radiation Oncology | Admitting: Radiation Oncology

## 2017-11-22 DIAGNOSIS — Z510 Encounter for antineoplastic radiation therapy: Secondary | ICD-10-CM | POA: Diagnosis not present

## 2017-11-23 ENCOUNTER — Ambulatory Visit
Admission: RE | Admit: 2017-11-23 | Discharge: 2017-11-23 | Disposition: A | Discharge status: Home / Self Care | Payer: Medicare Other | Source: Ambulatory Visit | Attending: Radiation Oncology | Admitting: Radiation Oncology

## 2017-11-23 ENCOUNTER — Telehealth: Payer: Self-pay | Admitting: Internal Medicine

## 2017-11-23 ENCOUNTER — Encounter: Payer: Self-pay | Admitting: Internal Medicine

## 2017-11-23 ENCOUNTER — Inpatient Hospital Stay (HOSPITAL_BASED_OUTPATIENT_CLINIC_OR_DEPARTMENT_OTHER): Payer: Medicare Other | Admitting: Internal Medicine

## 2017-11-23 ENCOUNTER — Other Ambulatory Visit: Payer: Medicare Other

## 2017-11-23 VITALS — BP 113/71 | HR 95 | Temp 98.7°F | Resp 17 | Ht 61.0 in | Wt 176.9 lb

## 2017-11-23 DIAGNOSIS — F209 Schizophrenia, unspecified: Secondary | ICD-10-CM

## 2017-11-23 DIAGNOSIS — C3431 Malignant neoplasm of lower lobe, right bronchus or lung: Secondary | ICD-10-CM | POA: Diagnosis not present

## 2017-11-23 DIAGNOSIS — F17210 Nicotine dependence, cigarettes, uncomplicated: Secondary | ICD-10-CM

## 2017-11-23 DIAGNOSIS — I10 Essential (primary) hypertension: Secondary | ICD-10-CM

## 2017-11-23 DIAGNOSIS — Z510 Encounter for antineoplastic radiation therapy: Secondary | ICD-10-CM | POA: Diagnosis not present

## 2017-11-23 DIAGNOSIS — F17200 Nicotine dependence, unspecified, uncomplicated: Secondary | ICD-10-CM

## 2017-11-23 DIAGNOSIS — J439 Emphysema, unspecified: Secondary | ICD-10-CM | POA: Diagnosis not present

## 2017-11-23 DIAGNOSIS — C3491 Malignant neoplasm of unspecified part of right bronchus or lung: Secondary | ICD-10-CM

## 2017-11-23 DIAGNOSIS — C7931 Secondary malignant neoplasm of brain: Secondary | ICD-10-CM | POA: Diagnosis not present

## 2017-11-23 DIAGNOSIS — Z79899 Other long term (current) drug therapy: Secondary | ICD-10-CM

## 2017-11-23 DIAGNOSIS — I700 Atherosclerosis of aorta: Secondary | ICD-10-CM

## 2017-11-23 DIAGNOSIS — G43709 Chronic migraine without aura, not intractable, without status migrainosus: Secondary | ICD-10-CM | POA: Diagnosis not present

## 2017-11-23 DIAGNOSIS — R112 Nausea with vomiting, unspecified: Secondary | ICD-10-CM

## 2017-11-23 NOTE — Progress Notes (Signed)
[facility] [phone]   [phone]  OFFICE PROGRESS NOTE  Aletha Halim., PA-C [address] [name]  DIAGNOSIS: stage IV (T2b, N2, M1c) non-small cell lung cancer, poorly differentiated adenocarcinoma.  This is diagnosed in May 2019 and presented with large right lower lobe lung mass in addition to mediastinal lymphadenopathy and metastatic disease to bones and brain.   PRIOR THERAPY: Palliative radiotherapy to the solitary brain metastases in addition to the large right lower lobe lung mass under the care of Dr. Lisbeth Renshaw.  This is expected to be completed on Nov 30, 2017.  CURRENT THERAPY: None.  INTERVAL HISTORY: Jill [name] 57 y.o. female returns to the clinic today for hospital follow-up visit.  The patient is feeling fine today with no specific complaints except for occasional headache.  She also had few episodes of nausea recently.  She is currently on amitriptyline as well as Decadron by Dr. Mickeal Skinner.  She is undergoing palliative radiotherapy to the large right lung mass under the care of Dr. Lisbeth Renshaw and expected to be completed Nov 30, 2017.  She denied having any current chest pain but has shortness of breath with exertion with no hemoptysis.  She has no fever or chills.  She has no weight loss or night sweats.  She is here today for evaluation and hospital follow-up visit.  The tissue block was sent for [facility] and the molecular studies are still pending.  MEDICAL HISTORY: Past Medical History:  Diagnosis Date  . Cancer ([place])   . Hypertension   . Schizophrenia ([place])     ALLERGIES:  is allergic to morphine and related and prednisone.  MEDICATIONS:  Current Outpatient Medications  Medication Sig Dispense Refill  . acetaminophen (TYLENOL) 500 MG tablet Take 1,000 mg by mouth every 6 (six) hours as needed.    Marland Kitchen amitriptyline (ELAVIL) 50 MG tablet Take 1 tablet (50 mg total) by mouth at bedtime. 30 tablet 3  .  benzonatate (TESSALON) 200 MG capsule Take 1 capsule (200 mg total) by mouth 2 (two) times daily. 30 capsule 0  . dexamethasone (DECADRON) 4 MG tablet Take 1 tablet (4 mg total) by mouth daily. 30 tablet 0  . haloperidol decanoate (HALDOL DECANOATE) 50 MG/ML injection Inject 25 mg into the muscle every 28 (twenty-eight) days. [place] by Filomena Jungling [phone]     . ipratropium-albuterol (DUONEB) 0.5-2.5 (3) MG/3ML SOLN Take 3 mLs by nebulization every 4 (four) hours as needed. 360 mL 0  . lisinopril-hydrochlorothiazide (PRINZIDE,ZESTORETIC) 20-25 MG tablet Take 1 tablet by mouth daily.    . nicotine (NICODERM CQ - DOSED IN MG/24 HOURS) 21 mg/24hr patch Place 21 mg onto the skin daily.    . ondansetron (ZOFRAN) 8 MG tablet Take 1 tablet (8 mg total) by mouth every 8 (eight) hours as needed for nausea or vomiting. 20 tablet 0  . oxyCODONE (OXY IR/ROXICODONE) 5 MG immediate release tablet Take 1 tablet (5 mg total) by mouth every 6 (six) hours as needed for moderate pain, severe pain or breakthrough pain. 60 tablet 0  . polyethylene glycol (MIRALAX / GLYCOLAX) packet Take 17 g by mouth 2 (two) times daily. 14 each 0  . promethazine (PHENERGAN) 12.5 MG tablet Take 12.5 mg by mouth as needed for nausea/vomiting.    . rosuvastatin (CRESTOR) 20 MG tablet Take 20 mg by mouth daily at 6 PM.    . senna-docusate (SENOKOT-S) 8.6-50 MG  tablet Take 1 tablet by mouth 2 (two) times daily. 30 tablet 0   No current facility-administered medications for this visit.     SURGICAL HISTORY:  Past Surgical History:  Procedure Laterality Date  . HERNIA REPAIR    . KIDNEY SURGERY    . VIDEO BRONCHOSCOPY Bilateral 11/14/2017   Procedure: VIDEO BRONCHOSCOPY WITH FLUORO;  Surgeon: Chesley Mires, MD;  Location: [facility];  Service: Endoscopy;  Laterality: Bilateral;    REVIEW OF SYSTEMS:  A comprehensive review of systems was negative except for: Constitutional: positive for  fatigue Respiratory: positive for cough and dyspnea on exertion Neurological: positive for headaches   PHYSICAL EXAMINATION: General appearance: alert, cooperative, fatigued and no distress Head: Normocephalic, without obvious abnormality, atraumatic Neck: no adenopathy, no JVD, supple, symmetrical, trachea midline and thyroid not enlarged, symmetric, no tenderness/mass/nodules Lymph nodes: Cervical, supraclavicular, and axillary nodes normal. Resp: clear to auscultation bilaterally Back: symmetric, no curvature. ROM normal. No CVA tenderness. Cardio: regular rate and rhythm, S1, S2 normal, no murmur, click, rub or gallop GI: soft, non-tender; bowel sounds normal; no masses,  no organomegaly Extremities: extremities normal, atraumatic, no cyanosis or edema  ECOG PERFORMANCE STATUS: 1 - Symptomatic but completely ambulatory  Blood pressure 113/71, pulse 95, temperature 98.7 F (37.1 C), temperature source Oral, resp. rate 17, height 5\' 1"  (1.549 m), weight 176 lb 14.4 oz (80.2 kg), SpO2 99 %.  LABORATORY DATA: Lab Results  Component Value Date   WBC 8.2 11/17/2017   HGB 12.6 11/17/2017   HCT 38.6 11/17/2017   MCV 87.3 11/17/2017   PLT 216 11/17/2017      Chemistry      Component Value Date/Time   NA 140 11/16/2017 0343   K 4.2 11/16/2017 0343   CL 105 11/16/2017 0343   CO2 24 11/16/2017 0343   BUN 17 11/16/2017 0343   CREATININE 0.62 11/16/2017 0343      Component Value Date/Time   CALCIUM 9.9 11/16/2017 0343   ALKPHOS 74 11/12/2017 2117   AST 20 11/12/2017 2117   ALT 18 11/12/2017 2117   BILITOT 0.8 11/12/2017 2117       RADIOGRAPHIC STUDIES: Ct Angio Chest Pe W And/or Wo Contrast  Result Date: 11/12/2017 CLINICAL DATA:  Hemoptysis lung tumor EXAM: CT ANGIOGRAPHY CHEST WITH CONTRAST TECHNIQUE: Multidetector CT imaging of the chest was performed using the standard protocol during bolus administration of intravenous contrast. Multiplanar CT image reconstructions and  MIPs were obtained to evaluate the vascular anatomy. CONTRAST:  100 cc Isovue 370 intravenous COMPARISON:  Chest x-ray 10/24/2017, CT chest 10/24/2017, PET-CT 11/08/2017 FINDINGS: Cardiovascular: Significant respiratory motion limits evaluation for pulmonary emboli, mostly within the lower lobes. No acute central embolus is seen. Nonaneurysmal aorta. No dissection. Moderate aortic atherosclerosis. Coronary vascular calcification. Normal heart size. No pericardial effusion Mediastinum/Nodes: Midline trachea. Coarse calcification right lobe of thyroid. Enlarged mediastinal lymph nodes. Right paratracheal lymph node measures 11 mm. Enlarged right hilar lymph nodes measuring 14 mm. Esophagus within normal limits. Lungs/Pleura: Right infrahilar lung mass measuring approximately 3.5 by 3.1 cm. Right middle lobe lung nodule measuring 18 mm. Mild emphysema. Right lung base mass now confluent with adjacent consolidation making it difficult to measure. Upper Abdomen: No acute abnormality. Stone in the mid pole of left kidney. Musculoskeletal: Left second rib lesion on PET-CT not well demonstrated. No additional suspicious bony findings Review of the MIP images confirms the above findings. IMPRESSION: 1. Limited study secondary to respiratory motion artifact particularly at the lung bases. No  definite acute central PE is seen 2. Similar size and appearance of right infrahilar lung mass. Additional mass in the right lower lobe has now become confluent with consolidation in the right lower lobe, making it difficult to measure. Enlarged mediastinal and right hilar lymph nodes as before. 3. Stable right middle lobe 18 mm lung nodule, this is noted to be PET negative 4. Stone in the left kidney Aortic Atherosclerosis (ICD10-I70.0) and Emphysema (ICD10-J43.9). Electronically Signed   By: Donavan Foil M.D.   On: 11/12/2017 23:46   Mr Jeri Cos LP Contrast  Result Date: 11/08/2017 CLINICAL DATA:  57 year old female undergoing  evaluation of suspected stage IIIA primary bronchogenic carcinoma. EXAM: MRI HEAD WITHOUT AND WITH CONTRAST TECHNIQUE: Multiplanar, multiecho pulse sequences of the brain and surrounding structures were obtained without and with intravenous contrast. CONTRAST:  43mL MULTIHANCE GADOBENATE DIMEGLUMINE 529 MG/ML IV SOLN COMPARISON:  PET-CT today reported separately. FINDINGS: Brain: Situated along the left parieto-occipital sulcus in the medial posterior left hemisphere there is an 11 millimeter mildly lobulated enhancing mass with surrounding T2 and FLAIR hyperintensity compatible with vasogenic edema. See series 12, image 35 and series 7, image 16. Minimal regional mass effect. No associated hemosiderin. There is perhaps mild associated linear leptomeningeal thickening and enhancement (series 12, image 36). No other abnormal enhancing brain lesion is identified. No other abnormal dural thickening or enhancement. No restricted diffusion to suggest acute infarction. No midline shift, ventriculomegaly, extra-axial collection or acute intracranial hemorrhage. Cervicomedullary junction and pituitary are within normal limits. Scattered small nonspecific nonenhancing mostly subcortical cerebral white matter T2 and FLAIR hyperintense foci bilaterally. No cortical encephalomalacia or chronic cerebral blood products identified. Vascular: Major intracranial vascular flow voids are preserved. The major dural venous sinuses are enhancing and appear patent. Skull and upper cervical spine: Normal visible cervical spine and spinal cord. Visualized bone marrow signal is within normal limits. Sinuses/Orbits: Normal orbits soft tissues. Paranasal sinuses are well pneumatized. Other: Mastoid air cells are clear. Visible internal auditory structures appear normal. Scalp and face soft tissues appear negative. IMPRESSION: 1. Positive for early metastatic disease to the brain. There is a solitary 11 mm enhancing mass situated in the left  parieto-occipital sulcus with mild regional edema. There may be associated focal leptomeningeal involvement (series 12, image 36). 2. No other metastatic disease or acute intracranial abnormality. Electronically Signed   By: Genevie Ann M.D.   On: 11/08/2017 10:55   Nm Pet Image Initial (pi) Skull Base To Thigh  Result Date: 11/08/2017 CLINICAL DATA:  Initial treatment strategy for right infrahilar mass and mediastinal adenopathy on lung cancer screening chest CT, presumed lung cancer. EXAM: NUCLEAR MEDICINE PET SKULL BASE TO THIGH TECHNIQUE: 9.0 mCi F-18 FDG was injected intravenously. Full-ring PET imaging was performed from the skull base to thigh after the radiotracer. CT data was obtained and used for attenuation correction and anatomic localization. Fasting blood glucose: 97 mg/dl COMPARISON:  Chest CT 10/24/2017 FINDINGS: Mediastinal blood pool activity: SUV max 2.2 NECK: No hypermetabolic cervical lymph nodes are identified.There are no lesions of the pharyngeal mucosal space. Physiologic activity associated with the muscles of phonation. Incidental CT findings: Bilateral carotid atherosclerosis. CHEST: 4.6 x 3.2 cm right hilar mass (image 79/4) is markedly hypermetabolic with an SUV max of 52. There is a hypermetabolic peripheral right lower lobe mass as well with intervening postobstructive pneumonitis which has mildly improved. This mass measures approximately 3.5 x 3.0 cm on image 47/8 and has an SUV max of 20.7. The 1.8  x 1.3 cm right middle lobe nodule on image 31/8 demonstrates no hypermetabolic activity. There is no suspicious metabolic activity within the left lung. There are small hypermetabolic mediastinal and right hilar lymph nodes. In particular, there is an 11 mm subcarinal node on image 75/4 which has an SUV max of 29.8. There is no contralateral mediastinal or hilar adenopathy. Low level hypermetabolic activity is also demonstrated within a small right axillary node (SUV max 1.4) and  superficially in the posterior right shoulder region (SUV max 2.6). Incidental CT findings: Diffuse atherosclerosis of the aorta, great vessels and coronary arteries. No pleural or pericardial effusion. ABDOMEN/PELVIS: There is no hypermetabolic activity within the liver, adrenal glands, spleen or pancreas. There is no hypermetabolic nodal activity. Incidental CT findings: There are small nonobstructing bilateral renal calculi. Also demonstrated are a duodenal diverticulum, a supraumbilical hernia containing only fat and diffuse aortic and branch vessel atherosclerosis. SKELETON: There are several hypermetabolic osseous lesions consistent with metastatic disease. These include a lesion laterally in the left 2nd rib (SUV max 13.1), a 16 mm with sclerotic margins in the L5 vertebral body (image 140/4, SUV max 22.4), and a small hypermetabolic lesion in the intertrochanteric region of the left femur (SUV max 7.0). There are other smaller lesions. No pathologic fractures are seen. Incidental CT findings: Spondylosis in the lower lumbar spine with mild hypermetabolic activity associated with the L4-5 facet joints. IMPRESSION: 1. The large right lower lobe mass and the associated mediastinal and right hilar adenopathy are hypermetabolic, consistent with lung cancer. In addition, there are several hypermetabolic osseous lesions consistent with metastatic disease. Stage IV disease, T2aN2M1b. 2. Slight improvement in postobstructive pneumonitis in the right lower lobe. 3. The right middle lobe nodule does not demonstrate hypermetabolic activity. 4. Incidental findings including bilateral nephrolithiasis and Aortic Atherosclerosis (ICD10-I70.0). Electronically Signed   By: Richardean Sale M.D.   On: 11/08/2017 15:11    ASSESSMENT AND PLAN: This is a very pleasant 57 years old white female recently diagnosed with stage IV non-small cell lung cancer, poorly differentiated adenocarcinoma presented with large right lower lobe  lung mass in addition to mediastinal lymphadenopathy and metastatic bone disease as well as solitary brain metastasis.  She is currently undergoing palliative radiotherapy to the right lower lobe lung mass as well as the brain metastasis. I requested the tissue block to be sent to [facility] for molecular studies and PDL 1 expression.  The results are still pending and expected to be available by December 04, 2017. I recommended for the patient to continue her current palliative radiotherapy as a scheduled. I will arrange for her to come back for follow-up visit in 2 weeks for reevaluation and detailed discussion of her treatment options based on the molecular studies. For smoke cessation the patient will continue with NicoDerm patch. She was advised to call immediately if she has any concerning symptoms in the interval. The patient voices understanding of current disease status and treatment options and is in agreement with the current care plan.  All questions were answered. The patient knows to call the clinic with any problems, questions or concerns. We can certainly see the patient much sooner if necessary.  I spent 10 minutes counseling the patient face to face. The total time spent in the appointment was 15 minutes.  Disclaimer: This note was dictated with voice recognition software. Similar sounding words can inadvertently be transcribed and may not be corrected upon review.

## 2017-11-23 NOTE — Telephone Encounter (Signed)
Scheduled appt per 5/23 los - unable to schedule appt in two weeks - scheduled next available. Gave patient AVS and calender per los.

## 2017-11-24 ENCOUNTER — Ambulatory Visit: Payer: Medicare Other

## 2017-11-24 ENCOUNTER — Other Ambulatory Visit: Payer: Self-pay | Admitting: Radiation Oncology

## 2017-11-24 ENCOUNTER — Ambulatory Visit: Payer: Medicare Other | Admitting: Radiation Oncology

## 2017-11-24 ENCOUNTER — Ambulatory Visit
Admission: RE | Admit: 2017-11-24 | Discharge: 2017-11-24 | Disposition: A | Discharge status: Home / Self Care | Payer: Medicare Other | Source: Ambulatory Visit | Attending: Radiation Oncology | Admitting: Radiation Oncology

## 2017-11-24 DIAGNOSIS — Z510 Encounter for antineoplastic radiation therapy: Secondary | ICD-10-CM | POA: Diagnosis not present

## 2017-11-28 ENCOUNTER — Ambulatory Visit: Payer: Medicare Other | Admitting: Radiation Oncology

## 2017-11-28 ENCOUNTER — Ambulatory Visit: Payer: Medicare Other

## 2017-11-28 ENCOUNTER — Ambulatory Visit
Admission: RE | Admit: 2017-11-28 | Discharge: 2017-11-28 | Disposition: A | Discharge status: Home / Self Care | Payer: Medicare Other | Source: Ambulatory Visit | Attending: Radiation Oncology | Admitting: Radiation Oncology

## 2017-11-28 DIAGNOSIS — Z510 Encounter for antineoplastic radiation therapy: Secondary | ICD-10-CM | POA: Diagnosis not present

## 2017-11-29 ENCOUNTER — Ambulatory Visit: Payer: Medicare Other

## 2017-11-29 ENCOUNTER — Ambulatory Visit
Admission: RE | Admit: 2017-11-29 | Discharge: 2017-11-29 | Disposition: A | Discharge status: Home / Self Care | Payer: Medicare Other | Source: Ambulatory Visit | Attending: Radiation Oncology | Admitting: Radiation Oncology

## 2017-11-29 ENCOUNTER — Ambulatory Visit: Payer: Medicare Other | Admitting: Radiation Oncology

## 2017-11-29 ENCOUNTER — Encounter (HOSPITAL_COMMUNITY): Payer: Self-pay | Admitting: Internal Medicine

## 2017-11-29 DIAGNOSIS — C7931 Secondary malignant neoplasm of brain: Secondary | ICD-10-CM

## 2017-11-29 DIAGNOSIS — Z510 Encounter for antineoplastic radiation therapy: Secondary | ICD-10-CM | POA: Diagnosis not present

## 2017-11-29 DIAGNOSIS — C7949 Secondary malignant neoplasm of other parts of nervous system: Principal | ICD-10-CM

## 2017-11-29 MED ORDER — GADOBENATE DIMEGLUMINE 529 MG/ML IV SOLN
15.0000 mL | Freq: Once | INTRAVENOUS | Status: AC | PRN
  Administered 2017-11-29: 15 mL via INTRAVENOUS

## 2017-11-30 ENCOUNTER — Ambulatory Visit
Admission: RE | Admit: 2017-11-30 | Discharge: 2017-11-30 | Disposition: A | Discharge status: Home / Self Care | Payer: Medicare Other | Source: Ambulatory Visit | Attending: Radiation Oncology | Admitting: Radiation Oncology

## 2017-11-30 ENCOUNTER — Encounter: Payer: Self-pay | Admitting: Radiation Oncology

## 2017-11-30 DIAGNOSIS — Z510 Encounter for antineoplastic radiation therapy: Secondary | ICD-10-CM | POA: Diagnosis not present

## 2017-11-30 DIAGNOSIS — C7931 Secondary malignant neoplasm of brain: Secondary | ICD-10-CM

## 2017-11-30 MED ORDER — SODIUM CHLORIDE 0.9% FLUSH
10.0000 mL | Freq: Once | INTRAVENOUS | Status: AC
  Administered 2017-11-30: 10 mL via INTRAVENOUS

## 2017-11-30 NOTE — Progress Notes (Signed)
Has armband been applied?  Yes  Does patient have an allergy to IV contrast dye?: No   Has patient ever received premedication for IV contrast dye?: No  Does patient take metformin?: No  If patient does take metformin when was the last dose: N/A  Date of lab work: 11/16/2017 BUN: 17 CR: 0.62 EGfr: >60  IV site: Right AC  Has IV site been added to flowsheet?  Yes  Cori Razor, RN, BSN

## 2017-12-04 ENCOUNTER — Other Ambulatory Visit: Payer: Self-pay | Admitting: *Deleted

## 2017-12-04 MED ORDER — ONDANSETRON HCL 8 MG PO TABS: 8.0000 mg | ORAL_TABLET | Freq: Three times a day (TID) | ORAL | 0 refills | Status: DC | PRN

## 2017-12-04 NOTE — Progress Notes (Signed)
Received report from [facility], patient stating she is almost out of Zofran and has some nausea.  Per [place] ok to refill

## 2017-12-04 NOTE — Progress Notes (Signed)
  Radiation Oncology         [phone] ________________________________  Name: Jill [name] MRN: [id number]  Date: 11/30/2017  DOB: Jan 02, 1961  End of Treatment Note  Diagnosis:   57 y.o. female with Probable Stage IV NSCLC, adenocarcinoma of the RLL lung with brain and bone metastases     Indication for treatment:  Palliative       Radiation treatment dates:   11/16/2017 - 11/30/2017  Site/dose:   RLL Lung / 30 Gy in 10 fractions  Beams/energy:   3D / 15X Photon  Narrative: The patient tolerated radiation treatment relatively well. Her primary complaint was a productive cough accompanied by left rib pain. She denied dysphasia or any other concerns.  Plan: The patient has completed radiation treatment. The patient will return to radiation oncology clinic for routine followup in one month. I advised them to call or return sooner if they have any questions or concerns related to their recovery or treatment.  ------------------------------------------------  [name], MD, PhD  This document serves as a record of services personally performed by Kyung Rudd, MD. It was created on his behalf by Rae Lips, a trained medical scribe. The creation of this record is based on the scribe's personal observations and the provider's statements to them. This document has been checked and approved by the attending provider.

## 2017-12-05 ENCOUNTER — Other Ambulatory Visit: Payer: Self-pay | Admitting: Radiation Oncology

## 2017-12-06 ENCOUNTER — Other Ambulatory Visit: Payer: Self-pay | Admitting: Internal Medicine

## 2017-12-06 ENCOUNTER — Telehealth: Payer: Self-pay

## 2017-12-06 DIAGNOSIS — Z510 Encounter for antineoplastic radiation therapy: Secondary | ICD-10-CM | POA: Insufficient documentation

## 2017-12-06 DIAGNOSIS — C3431 Malignant neoplasm of lower lobe, right bronchus or lung: Secondary | ICD-10-CM | POA: Insufficient documentation

## 2017-12-06 MED ORDER — OXYCODONE HCL 5 MG PO TABS: 5.0000 mg | ORAL_TABLET | Freq: Four times a day (QID) | ORAL | 0 refills | Status: DC | PRN

## 2017-12-06 MED ORDER — ONDANSETRON HCL 8 MG PO TABS: 8.0000 mg | ORAL_TABLET | Freq: Three times a day (TID) | ORAL | 2 refills | Status: DC | PRN

## 2017-12-06 NOTE — Telephone Encounter (Signed)
Pt called this morning requesting refills for oxycodone and zofran to be sent to [facility]. Orders sent by Dr. Mickeal Skinner. Called pt and LVM that orders sent and that new oxycodone script should last over one month. Reminder regarding pt appt with Dr. Mickeal Skinner tomorrow.

## 2017-12-07 ENCOUNTER — Encounter: Payer: Self-pay | Admitting: Internal Medicine

## 2017-12-07 ENCOUNTER — Other Ambulatory Visit: Payer: Self-pay

## 2017-12-07 ENCOUNTER — Inpatient Hospital Stay: Payer: Medicare Other | Attending: Internal Medicine | Admitting: Internal Medicine

## 2017-12-07 ENCOUNTER — Encounter (HOSPITAL_COMMUNITY): Payer: Self-pay | Admitting: Internal Medicine

## 2017-12-07 ENCOUNTER — Telehealth: Payer: Self-pay | Admitting: Internal Medicine

## 2017-12-07 ENCOUNTER — Encounter: Payer: Self-pay | Admitting: Radiation Oncology

## 2017-12-07 ENCOUNTER — Ambulatory Visit
Admission: RE | Admit: 2017-12-07 | Discharge: 2017-12-07 | Disposition: A | Discharge status: Home / Self Care | Payer: Medicare Other | Source: Ambulatory Visit | Attending: Radiation Oncology | Admitting: Radiation Oncology

## 2017-12-07 VITALS — BP 110/83 | HR 111 | Temp 98.7°F | Resp 20

## 2017-12-07 VITALS — BP 110/69 | HR 100 | Temp 98.0°F | Resp 18 | Ht 61.0 in | Wt 176.9 lb

## 2017-12-07 DIAGNOSIS — F17210 Nicotine dependence, cigarettes, uncomplicated: Secondary | ICD-10-CM | POA: Diagnosis not present

## 2017-12-07 DIAGNOSIS — C7931 Secondary malignant neoplasm of brain: Secondary | ICD-10-CM

## 2017-12-07 DIAGNOSIS — I10 Essential (primary) hypertension: Secondary | ICD-10-CM | POA: Diagnosis not present

## 2017-12-07 DIAGNOSIS — Z79899 Other long term (current) drug therapy: Secondary | ICD-10-CM

## 2017-12-07 DIAGNOSIS — G43709 Chronic migraine without aura, not intractable, without status migrainosus: Secondary | ICD-10-CM

## 2017-12-07 DIAGNOSIS — G4700 Insomnia, unspecified: Secondary | ICD-10-CM | POA: Diagnosis not present

## 2017-12-07 DIAGNOSIS — I700 Atherosclerosis of aorta: Secondary | ICD-10-CM | POA: Insufficient documentation

## 2017-12-07 DIAGNOSIS — F209 Schizophrenia, unspecified: Secondary | ICD-10-CM | POA: Diagnosis not present

## 2017-12-07 DIAGNOSIS — C3431 Malignant neoplasm of lower lobe, right bronchus or lung: Secondary | ICD-10-CM | POA: Insufficient documentation

## 2017-12-07 DIAGNOSIS — Z510 Encounter for antineoplastic radiation therapy: Secondary | ICD-10-CM | POA: Diagnosis not present

## 2017-12-07 MED ORDER — DEXAMETHASONE 2 MG PO TABS: 2.0000 mg | ORAL_TABLET | Freq: Every day | ORAL | 2 refills | Status: DC

## 2017-12-07 NOTE — Progress Notes (Signed)
[facility] [place], [place] [address] [phone]   Interval Evaluation  Date of Service: 12/07/17 Patient Name: Jill [name] Patient MRN: [id number] Patient DOB: Feb 12, 1961 Provider: Ventura Sellers, MD  Identifying Statement:  Jill [name] is a 58 y.o. female with No primary diagnosis found. who presents for initial consultation and evaluation regarding cancer associated neurologic deficits.    Primary Cancer: [place] Lung Cancer Stage IV   Interval History:  Jill [name] presents today for headache follow up.  She is still pending SRS for brain metastasis which is scheduled for next week with Dr. Lisbeth Renshaw.  Headaches overall have improved since our visit 2 weeks ago, there is a subjective decrease in frequency and intensity.  She has been taking the Elavil and has implemented sleep hygiene practices we discussed.  She has stabilized her caffeine intake.  Oxycodone use is still as prior at 21m every 6 hours, plus Tylenol frequently.  Met with Dr. MJulien Nordmannfor lung cancer treatment planning which is still pending tissue biomarkers.       Medications: Current Outpatient Medications on File Prior to Visit  Medication Sig Dispense Refill  . acetaminophen (TYLENOL) 500 MG tablet Take 1,000 mg by mouth every 6 (six) hours as needed.    .Marland Kitchenamitriptyline (ELAVIL) 50 MG tablet Take 1 tablet (50 mg total) by mouth at bedtime. 30 tablet 3  . benzonatate (TESSALON) 200 MG capsule Take 1 capsule (200 mg total) by mouth 2 (two) times daily. 30 capsule 0  . dexamethasone (DECADRON) 4 MG tablet Take 1 tablet (4 mg total) by mouth daily. 30 tablet 0  . haloperidol decanoate (HALDOL DECANOATE) 50 MG/ML injection Inject 25 mg into the muscle every 28 (twenty-eight) days. PLynnby AFilomena [phone]    . ipratropium-albuterol (DUONEB) 0.5-2.5 (3) MG/3ML SOLN Take 3 mLs by nebulization every 4 (four) hours as needed. 360  mL 0  . lisinopril-hydrochlorothiazide (PRINZIDE,ZESTORETIC) 20-25 MG tablet Take 1 tablet by mouth daily.    . nicotine (NICODERM CQ - DOSED IN MG/24 HOURS) 21 mg/24hr patch Place 21 mg onto the skin daily.    . ondansetron (ZOFRAN) 8 MG tablet Take 1 tablet (8 mg total) by mouth every 8 (eight) hours as needed for nausea or vomiting. 60 tablet 2  . oxyCODONE (OXY IR/ROXICODONE) 5 MG immediate release tablet Take 1 tablet (5 mg total) by mouth every 6 (six) hours as needed for moderate pain, severe pain or breakthrough pain. 60 tablet 0  . polyethylene glycol (MIRALAX / GLYCOLAX) packet Take 17 g by mouth 2 (two) times daily. 14 each 0  . promethazine (PHENERGAN) 12.5 MG tablet Take 12.5 mg by mouth as needed for nausea/vomiting.    . rosuvastatin (CRESTOR) 20 MG tablet Take 20 mg by mouth daily at 6 PM.    . senna-docusate (SENOKOT-S) 8.6-50 MG tablet Take 1 tablet by mouth 2 (two) times daily. 30 tablet 0   No current facility-administered medications on file prior to visit.     Allergies:  Allergies  Allergen Reactions  . Morphine And Related     Headaches, Vomiting  . Prednisone     Dizziness and pain   Past Medical History:  Past Medical History:  Diagnosis Date  . [place]   . Hypertension   . Schizophrenia ([facility]    Past Surgical History:  Past Surgical History:  Procedure Laterality Date  .  HERNIA REPAIR    . KIDNEY SURGERY    . VIDEO BRONCHOSCOPY Bilateral 11/14/2017   Procedure: VIDEO BRONCHOSCOPY WITH FLUORO;  Surgeon: Chesley Mires, MD;  Location: [facility];  Service: Endoscopy;  Laterality: Bilateral;   Social History:  Social History   Socioeconomic History  . Marital status: Single    Spouse name: Not on file  . Number of children: Not on file  . Years of education: Not on file  . Highest education level: Not on file  Occupational History  . Not on file  Social Needs  . Financial resource strain: Not on file  . Food insecurity:    Worry: Not on  file    Inability: Not on file  . Transportation needs:    Medical: Not on file    Non-medical: Not on file  Tobacco Use  . Smoking status: Current Every Day Smoker    Packs/day: 2.00    Years: 36.00    Pack years: 72.00    Types: Cigarettes    Start date: 10/31/1981  . Smokeless tobacco: Never Used  . Tobacco comment: HAS CALLED QUIT 4 FREE ALREADY  Substance and Sexual Activity  . Alcohol use: Never    Frequency: Never  . Drug use: Never  . Sexual activity: Not on file  Lifestyle  . Physical activity:    Days per week: Not on file    Minutes per session: Not on file  . Stress: Not on file  Relationships  . Social connections:    Talks on phone: Not on file    Gets together: Not on file    Attends religious service: Not on file    Active member of club or organization: Not on file    Attends meetings of clubs or organizations: Not on file    Relationship status: Not on file  . Intimate partner violence:    Fear of current or ex partner: Not on file    Emotionally abused: Not on file    Physically abused: Not on file    Forced sexual activity: Not on file  Other Topics Concern  . Not on file  Social History Narrative  . Not on file   Family History: History reviewed. No pertinent family history.  Review of Systems: Constitutional: Denies fevers, chills or abnormal weight loss Eyes: Denies blurriness of vision Ears, nose, mouth, throat, and face: Denies mucositis or sore throat Respiratory: +cough, dyspnea, hemoptysis Cardiovascular: Denies palpitation, chest discomfort or lower extremity swelling Gastrointestinal:  Denies nausea, constipation, diarrhea GU: Denies dysuria or incontinence Skin: Denies abnormal skin rashes Neurological: Per HPI Musculoskeletal: Denies joint pain, back or neck discomfort. No decrease in ROM Behavioral/Psych: +anxiety  Physical Exam: Vitals:   12/07/17 0905  BP: 110/69  Pulse: 100  Resp: 18  Temp: 98 F (36.7 C)  SpO2: 95%     KPS: 80. General: Alert, cooperative, pleasant, in no acute distress Head: Normal EENT: No conjunctival injection or scleral icterus. Oral mucosa moist Lungs: Normal resp effort Cardiac: Regular rate and rhythm Abdomen: Soft, non-distended abdomen Skin: No rashes cyanosis or petechiae. Extremities: No clubbing or edema  Neurologic Exam: Mental Status: Awake, alert, attentive to examiner. Oriented to self and environment. Language is fluent with intact comprehension.  Cranial Nerves: Visual acuity is grossly normal. Visual fields are full. Extra-ocular movements intact. No ptosis. Face is symmetric, tongue midline. Motor: Tone and bulk are normal. Power is full in both arms and legs. Reflexes are symmetric, no pathologic reflexes  present. Intact finger to nose bilaterally Sensory: Intact to light touch and temperature Gait: Normal and tandem gait is normal.   Labs: I have reviewed the data as listed    Component Value Date/Time   NA 140 11/16/2017 0343   K 4.2 11/16/2017 0343   CL 105 11/16/2017 0343   CO2 24 11/16/2017 0343   GLUCOSE 135 (H) 11/16/2017 0343   BUN 17 11/16/2017 0343   CREATININE 0.62 11/16/2017 0343   CALCIUM 9.9 11/16/2017 0343   PROT 7.4 11/12/2017 2117   ALBUMIN 4.2 11/12/2017 2117   AST 20 11/12/2017 2117   ALT 18 11/12/2017 2117   ALKPHOS 74 11/12/2017 2117   BILITOT 0.8 11/12/2017 2117   GFRNONAA >60 11/16/2017 0343   GFRAA >60 11/16/2017 0343   Lab Results  Component Value Date   WBC 8.2 11/17/2017   HGB 12.6 11/17/2017   HCT 38.6 11/17/2017   MCV 87.3 11/17/2017   PLT 216 11/17/2017    Assessment/Plan 1. Chronic migraine without aura without status migrainosus, not intractable  2. Brain metastases [facility])  Jill [name] is clinically stable to improved today.  We again stressed the medication overuse component of this headache and the importance of decreasing reliance on analgesia.  We recommended initiating wean from oxycodone, starting  by spreading intake out to q8 from q6.  Decadron should also be decreased to 34m daily to avoid side of effects of chronic steroid use.  Can continue 83mzofran q6 PRN as well as Elavil 5043mS for headache prophylaxis.    Has upcoming optometric evaluation.    Currently wearing nicotine patch and not smoking at this time.  Pending evaluation by Dr. MohJulien Nordmann determine treatment plan for her lung cancer.  We appreciate the opportunity to participate in the care of Jill [name]  She should return in 3 months for evaluation following her next MRI brain.  All questions were answered. The patient knows to call the clinic with any problems, questions or concerns. No barriers to learning were detected.  The total time spent in the encounter was 25 minutes and more than 50% was on counseling and review of test results   ZacVentura SellersD Medical Director of Neuro-Oncology [facility] WesPuckett/06/19 9:03 AM

## 2017-12-07 NOTE — Progress Notes (Signed)
Patient rested with in the clinic for 30 minutes following her [place] treatment.  No complaints of pain, visual disturbances, or headache.  She was given a follow-up appointment.  Patient walked out of the clinic, instructed to call with any questions or concerns.  Cori Razor, RN

## 2017-12-07 NOTE — Telephone Encounter (Signed)
Scheduled appt per 6/6 los - gave patient aVS and calender per los.

## 2017-12-07 NOTE — Progress Notes (Signed)
  Radiation Oncology         [phone] ________________________________  Name: OLIVER NEUWIRTH MRN: [id number]  Date: 12/07/2017  DOB: [birth year]   SPECIAL TREATMENT PROCEDURE   3D TREATMENT PLANNING AND DOSIMETRY: The patient's radiation plan was reviewed and approved by Dr. Sherwood Gambler from neurosurgery and radiation oncology prior to treatment. It showed 3-dimensional radiation distributions overlaid onto the planning CT/MRI image set. The [facility] for the target structures as well as the organs at risk were reviewed. The documentation of the 3D plan and dosimetry are filed in the radiation oncology EMR.   NARRATIVE: The patient was brought to the TrueBeam stereotactic radiation treatment machine and placed supine on the CT couch. The head frame was applied, and the patient was set up for stereotactic radiosurgery. Neurosurgery was present for the set-up and delivery   SIMULATION VERIFICATION: In the couch zero-angle position, the patient underwent Exactrac imaging using the Brainlab system with orthogonal KV images. These were carefully aligned and repeated to confirm treatment position for each of the isocenters. The Exactrac snap film verification was repeated at each couch angle.   SPECIAL TREATMENT PROCEDURE: The patient received stereotactic radiosurgery to the following target:  PTV1 target was treated using 3 Arcs to a prescription dose of 20 Gy. ExacTrac Snap verification was performed for each couch angle.   STEREOTACTIC TREATMENT MANAGEMENT: Following delivery, the patient was transported to nursing in stable condition and monitored for possible acute effects. Vital signs were recorded . The patient tolerated treatment without significant acute effects, and was discharged to home in stable condition.  PLAN: Follow-up in one month.   ------------------------------------------------  [name], MD, PhD

## 2017-12-07 NOTE — Op Note (Signed)
Stereotactic Radiosurgery Operative Note  Name: Jill [name] MRN: [id number]  Date: 12/07/2017  DOB: 05-31-1961  Op Note  Pre Operative Diagnosis:  Solitary left medial parietal brain metastasis from metastatic lung cancer  Post Operative Diagnois:  Solitary left medial parietal brain metastasis from metastatic lung cancer  3D TREATMENT PLANNING AND DOSIMETRY:  The patient's radiation plan was reviewed and approved by myself (neurosurgery) and Dr. Kyung Rudd (radiation oncology) prior to treatment.  It showed 3-dimensional radiation distributions overlaid onto the planning CT/MRI image set.  The [facility] for the target structures as well as the organs at risk were reviewed. The documentation of the 3D plan and dosimetry are filed in the radiation oncology EMR.  NARRATIVE:  Jill [name] was brought to the TrueBeam stereotactic radiation treatment machine and placed supine on the CT couch. The head frame was applied, and the patient was set up for stereotactic radiosurgery.  I was present for the set-up and delivery.  SIMULATION VERIFICATION:  In the couch zero-angle position, the patient underwent Exactrac imaging using the Brainlab system with orthogonal KV images.  These were carefully aligned and repeated to confirm treatment position for each of the isocenters.  The Exactrac snap film verification was repeated at each couch angle.  SPECIAL TREATMENT PROCEDURE: Jill [name] received stereotactic radiosurgery to the following targets: Left parietal target (10 mm) was treated using 3 Dynamic Conformal Arcs to a prescription dose of 20 Gy.  ExacTrac registration was performed for each couch angle.  The 100% isodose line was prescribed.  STEREOTACTIC TREATMENT MANAGEMENT:  Following delivery, the patient was transported to nursing in stable condition and monitored for possible acute effects.  Vital signs were recorded. The patient tolerated treatment without significant acute effects, and was  discharged to home in stable condition.    PLAN: Follow-up in one month.

## 2017-12-07 NOTE — Progress Notes (Signed)
  Radiation Oncology         [phone] ________________________________  Name: Jill [name] MRN: [id number]  Date: 11/30/2017  DOB: 15-Feb-1961  DIAGNOSIS:     ICD-10-CM   1. Brain metastases ([facility]) C79.31     NARRATIVE:  The patient was brought to the [facility].  Identity was confirmed.  All relevant records and images related to the planned course of therapy were reviewed.  The patient freely provided informed written consent to proceed with treatment after reviewing the details related to the planned course of therapy. The consent form was witnessed and verified by the simulation staff. Intravenous access was established for contrast administration. Then, the patient was set-up in a stable reproducible supine position for radiation therapy.  A relocatable thermoplastic stereotactic head frame was fabricated for precise immobilization.  CT images were obtained.  Surface markings were placed.  The CT images were loaded into the planning software and fused with the patient's targeting MRI scan.  Then the target and avoidance structures were contoured.  Treatment planning then occurred.  The radiation prescription was entered and confirmed.  I have requested 3D planning  I have requested a DVH of the following structures: Brain stem, brain, left eye, right eye, lenses, optic chiasm, target volumes, uninvolved brain, and normal tissue.    SPECIAL TREATMENT PROCEDURE:  The planned course of therapy using radiation constitutes a special treatment procedure. Special care is required in the management of this patient for the following reasons. This treatment constitutes a Special Treatment Procedure for the following reason: High dose per fraction requiring special monitoring for increased toxicities of treatment including daily imaging.  The special nature of the planned course of radiotherapy will require increased physician supervision and oversight to ensure patient's safety with  optimal treatment outcomes.  PLAN:  The patient will receive 20 Gy in 1 fraction.   ------------------------------------------------  [name], MD, PhD

## 2017-12-11 NOTE — Progress Notes (Signed)
  Radiation Oncology         [phone] ________________________________  Name: Jill [name] MRN: [id number]  Date: 12/07/2017  DOB: 06/08/1961  End of Treatment Note  Diagnosis:   57 y.o. 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   There were no signs of acute toxicity after treatment.  Plan: The patient has completed radiation treatment. The patient will return to radiation oncology clinic for routine followup in one month. I advised the patient to call or return sooner if they have any questions or concerns related to their recovery or treatment. ________________________________  [name], MD, PhD  This document serves as a record of services personally performed by Kyung Rudd, MD. It was created on his behalf by Rae Lips, a trained medical scribe. The creation of this record is based on the scribe's personal observations and the provider's statements to them. This document has been checked and approved by the attending provider.

## 2017-12-12 ENCOUNTER — Encounter: Payer: Self-pay | Admitting: Oncology

## 2017-12-12 ENCOUNTER — Inpatient Hospital Stay: Payer: Medicare Other

## 2017-12-12 ENCOUNTER — Other Ambulatory Visit: Payer: Self-pay | Admitting: Internal Medicine

## 2017-12-12 ENCOUNTER — Other Ambulatory Visit: Payer: Self-pay | Admitting: Radiation Therapy

## 2017-12-12 ENCOUNTER — Telehealth: Payer: Self-pay | Admitting: Internal Medicine

## 2017-12-12 ENCOUNTER — Inpatient Hospital Stay (HOSPITAL_BASED_OUTPATIENT_CLINIC_OR_DEPARTMENT_OTHER): Payer: Medicare Other | Admitting: Oncology

## 2017-12-12 VITALS — BP 107/66 | HR 119 | Temp 97.7°F | Resp 19 | Ht 61.0 in | Wt 178.8 lb

## 2017-12-12 DIAGNOSIS — G43709 Chronic migraine without aura, not intractable, without status migrainosus: Secondary | ICD-10-CM | POA: Diagnosis not present

## 2017-12-12 DIAGNOSIS — C7951 Secondary malignant neoplasm of bone: Secondary | ICD-10-CM

## 2017-12-12 DIAGNOSIS — Z79899 Other long term (current) drug therapy: Secondary | ICD-10-CM | POA: Diagnosis not present

## 2017-12-12 DIAGNOSIS — C3431 Malignant neoplasm of lower lobe, right bronchus or lung: Secondary | ICD-10-CM

## 2017-12-12 DIAGNOSIS — Z5112 Encounter for antineoplastic immunotherapy: Secondary | ICD-10-CM | POA: Insufficient documentation

## 2017-12-12 DIAGNOSIS — R5383 Other fatigue: Secondary | ICD-10-CM

## 2017-12-12 DIAGNOSIS — I10 Essential (primary) hypertension: Secondary | ICD-10-CM | POA: Diagnosis not present

## 2017-12-12 DIAGNOSIS — C7931 Secondary malignant neoplasm of brain: Secondary | ICD-10-CM

## 2017-12-12 DIAGNOSIS — F209 Schizophrenia, unspecified: Secondary | ICD-10-CM | POA: Diagnosis not present

## 2017-12-12 DIAGNOSIS — G4700 Insomnia, unspecified: Secondary | ICD-10-CM | POA: Diagnosis not present

## 2017-12-12 DIAGNOSIS — F17210 Nicotine dependence, cigarettes, uncomplicated: Secondary | ICD-10-CM

## 2017-12-12 DIAGNOSIS — I700 Atherosclerosis of aorta: Secondary | ICD-10-CM

## 2017-12-12 DIAGNOSIS — Z7189 Other specified counseling: Secondary | ICD-10-CM | POA: Insufficient documentation

## 2017-12-12 LAB — CMP (CANCER CENTER ONLY)
ALT: 15 U/L (ref 0–55)
AST: 12 U/L (ref 5–34)
Albumin: 3.7 g/dL (ref 3.5–5.0)
Alkaline Phosphatase: 118 U/L (ref 40–150)
Anion gap: 10 (ref 3–11)
BILIRUBIN TOTAL: 0.5 mg/dL (ref 0.2–1.2)
BUN: 12 mg/dL (ref 7–26)
CALCIUM: 9.9 mg/dL (ref 8.4–10.4)
CO2: 22 mmol/L (ref 22–29)
CREATININE: 0.65 mg/dL (ref 0.60–1.10)
Chloride: 102 mmol/L (ref 98–109)
GFR, Est AFR Am: >60 mL/min (ref >60)
Glucose, Bld: 99 mg/dL (ref 70–140)
Potassium: 4 mmol/L (ref 3.5–5.1)
Sodium: 134 mmol/L — ABNORMAL LOW (ref 136–145)
TOTAL PROTEIN: 6.6 g/dL (ref 6.4–8.3)

## 2017-12-12 LAB — CBC WITH DIFFERENTIAL (CANCER CENTER ONLY)
BASOS ABS: 0 10*3/uL (ref 0.0–0.1)
BASOS PCT: 0 %
EOS PCT: 6 %
Eosinophils Absolute: 0.5 10*3/uL (ref 0.0–0.5)
HCT: 40.7 % (ref 34.8–46.6)
Hemoglobin: 13.3 g/dL (ref 11.6–15.9)
Lymphocytes Relative: 7 %
Lymphs Abs: 0.6 10*3/uL — ABNORMAL LOW (ref 0.9–3.3)
MCH: 28.9 pg (ref 25.1–34.0)
MCHC: 32.7 g/dL (ref 31.5–36.0)
MCV: 88.5 fL (ref 79.5–101.0)
Monocytes Absolute: 0.4 10*3/uL (ref 0.1–0.9)
Monocytes Relative: 5 %
Neutro Abs: 6.6 10*3/uL — ABNORMAL HIGH (ref 1.5–6.5)
Neutrophils Relative %: 82 %
PLATELETS: 140 10*3/uL — AB (ref 145–400)
RBC: 4.6 MIL/uL (ref 3.70–5.45)
RDW: 15.4 % — ABNORMAL HIGH (ref 11.2–14.5)
WBC: 8.1 10*3/uL (ref 3.9–10.3)

## 2017-12-12 NOTE — Assessment & Plan Note (Signed)
This is a very pleasant 56 year old white female recently diagnosed with stage IV non-small cell lung cancer, poorly differentiated adenocarcinoma presented with large right lower lobe lung mass in addition to mediastinal lymphadenopathy and metastatic bone disease as well as solitary brain metastasis.    She recently completed palliative radiotherapy to the right lower lobe lung mass as well as the brain metastasis. Tissue block was [facility] to [facility] for molecular studies and PDL 1 expression.  She is here to discuss these results and her treatment options.  The patient was seen with Dr. Julien Nordmann.  Molecular studies were discussed with the patient which did not show any mutations with which we can use targeted therapy with.  Her PD-L1 was 99%.  We discussed treatment options including Keytruda as a single agent.  We again discussed that her disease is not curable and that treatment will be palliative. Adverse effects of this treatment were discussed including but not limited to immune mediated the skin rash, diarrhea, inflammation of the lung, kidney, liver, thyroid or other endocrine dysfunction.  The patient would like to proceed with treatment. She be set up for chemotherapy education class. Anticipate first dose of treatment on 12/21/2017. The patient will follow-up in approximately 4 weeks for evaluation prior to cycle #2.  For smoking cessation the patient will continue with NicoDerm patch.  She was advised to call immediately if she has any concerning symptoms in the interval. The patient voices understanding of current disease status and treatment options and is in agreement with the current care plan.  All questions were answered. The patient knows to call the clinic with any problems, questions or concerns. We can certainly see the patient much sooner if necessary.

## 2017-12-12 NOTE — Progress Notes (Signed)
[facility] OFFICE PROGRESS NOTE  Aletha Halim., PA-C [address] [place] [id number]  DIAGNOSIS: stage IV (T2b, N2, M1c) non-small cell lung cancer, poorly differentiated adenocarcinoma. This is diagnosed in May 2019 and presented with large right lower lobe lung mass in addition to mediastinal lymphadenopathy and metastatic disease to bones and brain.  PD-L1 99%  PRIOR THERAPY:  1) Palliative radiotherapy to the large right lower lobe lung mass under the care of Dr. Lisbeth Renshaw.  Completed on Nov 30, 2017. 2) SRS to brain on 12/07/2017.   CURRENT THERAPY: Single agent Keytruda 200 mg IV every 3 weeks.  First dose expected on 12/21/2017.  INTERVAL HISTORY: Jill [name] 57 y.o. female returns for routine follow-up visit by herself.  The patient is feeling fine today has no specific complaints of her ongoing headaches.  She is being followed by neurology for management of her headaches.  She is currently on amitriptyline and uses oxycodone about every 6-7 hours.  She is exercising Tylenol in between doses of oxycodone.  She reports that she is on dexamethasone 1 mg daily.  She denies fevers and chills.  Denies chest pain, shortness of breath, hemoptysis.  She has an ongoing cough.  Denies nausea, vomiting, constipation, diarrhea.  Denies skin rashes.  The patient is here for discussion of her treatment options.  MEDICAL HISTORY: Past Medical History:  Diagnosis Date  . Cancer ([place])   . Hypertension   . Schizophrenia ([place])     ALLERGIES:  is allergic to morphine and related and prednisone.  MEDICATIONS:  Current Outpatient Medications  Medication Sig Dispense Refill  . acetaminophen (TYLENOL) 500 MG tablet Take 1,000 mg by mouth every 6 (six) hours as needed.    Marland Kitchen amitriptyline (ELAVIL) 50 MG tablet Take 1 tablet (50 mg total) by mouth at bedtime. 30 tablet 3  . dexamethasone (DECADRON) 2 MG tablet Take 1 tablet (2 mg total) by mouth daily. 30 tablet 2  .  lisinopril-hydrochlorothiazide (PRINZIDE,ZESTORETIC) 20-25 MG tablet Take 1 tablet by mouth daily.    . nicotine (NICODERM CQ - DOSED IN MG/24 HOURS) 21 mg/24hr patch Place 21 mg onto the skin daily.    Marland Kitchen oxyCODONE (OXY IR/ROXICODONE) 5 MG immediate release tablet Take 1 tablet (5 mg total) by mouth every 6 (six) hours as needed for moderate pain, severe pain or breakthrough pain. 60 tablet 0  . rosuvastatin (CRESTOR) 20 MG tablet Take 20 mg by mouth daily at 6 PM.    . senna-docusate (SENOKOT-S) 8.6-50 MG tablet Take 1 tablet by mouth 2 (two) times daily. 30 tablet 0  . benzonatate (TESSALON) 200 MG capsule Take 1 capsule (200 mg total) by mouth 2 (two) times daily. (Patient not taking: Reported on 12/07/2017) 30 capsule 0  . haloperidol (HALDOL) 5 MG tablet Take 5 mg by mouth 2 (two) times daily.    Marland Kitchen ipratropium-albuterol (DUONEB) 0.5-2.5 (3) MG/3ML SOLN Take 3 mLs by nebulization every 4 (four) hours as needed. (Patient not taking: Reported on 12/07/2017) 360 mL 0  . ondansetron (ZOFRAN) 8 MG tablet Take 1 tablet (8 mg total) by mouth every 8 (eight) hours as needed for nausea or vomiting. (Patient not taking: Reported on 12/12/2017) 60 tablet 2  . polyethylene glycol (MIRALAX / GLYCOLAX) packet MIX AND DRINK 1 PACKET TWICE DAILY (Patient not taking: Reported on 12/12/2017) 14 packet 0  . promethazine (PHENERGAN) 12.5 MG tablet Take 12.5 mg by mouth as needed for nausea/vomiting.  No current facility-administered medications for this visit.     SURGICAL HISTORY:  Past Surgical History:  Procedure Laterality Date  . HERNIA REPAIR    . KIDNEY SURGERY    . VIDEO BRONCHOSCOPY Bilateral 11/14/2017   Procedure: VIDEO BRONCHOSCOPY WITH FLUORO;  Surgeon: Chesley Mires, MD;  Location: [facility];  Service: Endoscopy;  Laterality: Bilateral;    REVIEW OF SYSTEMS:   Review of Systems  Constitutional: Negative for appetite change, chills, fatigue, fever and unexpected weight change.  HENT:   Negative  for mouth sores, nosebleeds, sore throat and trouble swallowing.   Eyes: Negative for eye problems and icterus.  Respiratory: Negative for hemoptysis, shortness of breath and wheezing.  Positive for cough. Cardiovascular: Negative for chest pain and leg swelling.  Gastrointestinal: Negative for abdominal pain, constipation, diarrhea, nausea and vomiting.  Genitourinary: Negative for bladder incontinence, difficulty urinating, dysuria, frequency and hematuria.   Musculoskeletal: Negative for gait problem, neck pain and neck stiffness.  Skin: Negative for itching and rash.  Neurological: Negative for dizziness, extremity weakness, gait problem, light-headedness and seizures. Positive for headaches. Hematological: Negative for adenopathy. Does not bruise/bleed easily.  Psychiatric/Behavioral: Negative for confusion, depression and sleep disturbance. The patient is not nervous/anxious.     PHYSICAL EXAMINATION:  Blood pressure 107/66, pulse (!) 119, temperature 97.7 F (36.5 C), temperature source Oral, resp. rate 19, height '5\' 1"'  (1.549 m), weight 178 lb 12.8 oz (81.1 kg), SpO2 97 %.  ECOG PERFORMANCE STATUS: 1 - Symptomatic but completely ambulatory  Physical Exam  Constitutional: Oriented to person, place, and time and well-developed, well-nourished, and in no distress. No distress.  HENT:  Head: Normocephalic and atraumatic.  Mouth/Throat: Oropharynx is clear and moist. No oropharyngeal exudate.  Eyes: Conjunctivae are normal. Right eye exhibits no discharge. Left eye exhibits no discharge. No scleral icterus.  Neck: Normal range of motion. Neck supple.  Cardiovascular: Normal rate, regular rhythm, normal heart sounds and intact distal pulses.   Pulmonary/Chest: Effort normal. No respiratory distress.  Wheezes noted to the posterior right lung.  Abdominal: Soft. Bowel sounds are normal. Exhibits no distension and no mass. There is no tenderness.  Musculoskeletal: Normal range of motion.  Exhibits no edema.  Lymphadenopathy:    No cervical adenopathy.  Neurological: Alert and oriented to person, place, and time. Exhibits normal muscle tone. Gait normal. Coordination normal.  Skin: Skin is warm and dry. No rash noted. Not diaphoretic. No erythema. No pallor.  Psychiatric: Mood, memory and judgment normal.  Vitals reviewed.  LABORATORY DATA: Lab Results  Component Value Date   WBC 8.1 12/12/2017   HGB 13.3 12/12/2017   HCT 40.7 12/12/2017   MCV 88.5 12/12/2017   PLT 140 (L) 12/12/2017      Chemistry      Component Value Date/Time   NA 134 (L) 12/12/2017 0808   K 4.0 12/12/2017 0808   CL 102 12/12/2017 0808   CO2 22 12/12/2017 0808   BUN 12 12/12/2017 0808   CREATININE 0.65 12/12/2017 0808      Component Value Date/Time   CALCIUM 9.9 12/12/2017 0808   ALKPHOS 118 12/12/2017 0808   AST 12 12/12/2017 0808   ALT 15 12/12/2017 0808   BILITOT 0.5 12/12/2017 0808       RADIOGRAPHIC STUDIES:  Ct Angio Chest Pe W And/or Wo Contrast  Result Date: 11/12/2017 CLINICAL DATA:  Hemoptysis lung tumor EXAM: CT ANGIOGRAPHY CHEST WITH CONTRAST TECHNIQUE: Multidetector CT imaging of the chest was performed using the standard protocol  during bolus administration of intravenous contrast. Multiplanar CT image reconstructions and MIPs were obtained to evaluate the vascular anatomy. CONTRAST:  100 cc Isovue 370 intravenous COMPARISON:  Chest x-ray 10/24/2017, CT chest 10/24/2017, PET-CT 11/08/2017 FINDINGS: Cardiovascular: Significant respiratory motion limits evaluation for pulmonary emboli, mostly within the lower lobes. No acute central embolus is seen. Nonaneurysmal aorta. No dissection. Moderate aortic atherosclerosis. Coronary vascular calcification. Normal heart size. No pericardial effusion Mediastinum/Nodes: Midline trachea. Coarse calcification right lobe of thyroid. Enlarged mediastinal lymph nodes. Right paratracheal lymph node measures 11 mm. Enlarged right hilar lymph  nodes measuring 14 mm. Esophagus within normal limits. Lungs/Pleura: Right infrahilar lung mass measuring approximately 3.5 by 3.1 cm. Right middle lobe lung nodule measuring 18 mm. Mild emphysema. Right lung base mass now confluent with adjacent consolidation making it difficult to measure. Upper Abdomen: No acute abnormality. Stone in the mid pole of left kidney. Musculoskeletal: Left second rib lesion on PET-CT not well demonstrated. No additional suspicious bony findings Review of the MIP images confirms the above findings. IMPRESSION: 1. Limited study secondary to respiratory motion artifact particularly at the lung bases. No definite acute central PE is seen 2. Similar size and appearance of right infrahilar lung mass. Additional mass in the right lower lobe has now become confluent with consolidation in the right lower lobe, making it difficult to measure. Enlarged mediastinal and right hilar lymph nodes as before. 3. Stable right middle lobe 18 mm lung nodule, this is noted to be PET negative 4. Stone in the left kidney Aortic Atherosclerosis (ICD10-I70.0) and Emphysema (ICD10-J43.9). Electronically Signed   By: Donavan Foil M.D.   On: 11/12/2017 23:46   Mr Jeri Cos GY Contrast  Result Date: 11/29/2017 CLINICAL DATA:  New diagnosis lung cancer with metastatic disease and radiation. EXAM: MRI HEAD WITHOUT AND WITH CONTRAST TECHNIQUE: Multiplanar, multiecho pulse sequences of the brain and surrounding structures were obtained without and with intravenous contrast. CONTRAST:  52m MULTIHANCE GADOBENATE DIMEGLUMINE 529 MG/ML IV SOLN COMPARISON:  11/08/2017 FINDINGS: Brain: Slight reduction in size of a medial left posterior parietal metastasis, now measuring 9 mm as opposed to 11 mm. Diminished surrounding edema. No second brain parenchymal lesion is seen. Chronic small-vessel ischemic changes of the hemispheric white matter appear the same. No evidence of recent infarction. There is a 3-4 mm focus of  enhancement along the under surface of the tentorium on the right, slightly better seen today, probably due to the improved resolution of the scan. This is most consistent with a tiny meningioma. Small dural metastasis is not excluded but not favored. This can be followed on subsequent exams. No evidence of hydrocephalus or extra-axial collection. Vascular: Major vessels at the base of the brain show flow. Skull and upper cervical spine: Negative Sinuses/Orbits: Clear/normal Other: None IMPRESSION: Favorable response to initial therapy. Slight reduction in size of the solitary medial left posterior parietal brain metastasis from 11 mm to 9 mm. Considerable reduction in associated vasogenic edema. 3-4 mm focus of nodular dural enhancement along the inferior surface of the tentorium on the right, better seen today probably due to improved scan resolution. This is most consistent with a tiny meningioma. Dural metastasis is not excluded but not favored. This can be followed on subsequent exams. Electronically Signed   By: MNelson ChimesM.D.   On: 11/29/2017 10:45     ASSESSMENT/PLAN:  Primary malignant neoplasm of bronchus of right lower lobe ([facility] This is a very pleasant 57year old white female recently diagnosed with  stage IV non-small cell lung cancer, poorly differentiated adenocarcinoma presented with large right lower lobe lung mass in addition to mediastinal lymphadenopathy and metastatic bone disease as well as solitary brain metastasis.    She recently completed palliative radiotherapy to the right lower lobe lung mass as well as the brain metastasis. [facility] for molecular studies and PDL 1 expression.  She is here to discuss these results and her treatment options.  The patient was seen with Dr. Julien Nordmann.  Molecular studies were discussed with the patient which did not show any mutations with which we can use targeted therapy with.  Her PD-L1 was 99%.  We discussed  treatment options including Keytruda as a single agent.  We again discussed that her disease is not curable and that treatment will be palliative. Adverse effects of this treatment were discussed including but not limited to immune mediated the skin rash, diarrhea, inflammation of the lung, kidney, liver, thyroid or other endocrine dysfunction.  The patient would like to proceed with treatment. She be set up for chemotherapy education class. Anticipate first dose of treatment on 12/21/2017. The patient will follow-up in approximately 4 weeks for evaluation prior to cycle #2.  For smoking cessation the patient will continue with NicoDerm patch.  She was advised to call immediately if she has any concerning symptoms in the interval. The patient voices understanding of current disease status and treatment options and is in agreement with the current care plan.  All questions were answered. The patient knows to call the clinic with any problems, questions or concerns. We can certainly see the patient much sooner if necessary.   Orders Placed This Encounter  Procedures  . CBC with Differential (Cancer Center Only)    Standing Status:   Standing    Number of Occurrences:   20    Standing Expiration Date:   12/13/2018  . CMP ([place] only)    Standing Status:   Standing    Number of Occurrences:   20    Standing Expiration Date:   12/13/2018  . TSH    Standing Status:   Standing    Number of Occurrences:   20    Standing Expiration Date:   12/13/2018   Mikey Bussing, DNP, AGPCNP-BC, AOCNP 12/12/17  ADDENDUM: Hematology/Oncology Attending: I had a face-to-face encounter with the patient.  I recommended her care plan.  This is a very pleasant 57 years old white female recently diagnosed with non-small cell lung cancer, adenocarcinoma.  The patient underwent palliative radiotherapy to the right lung mass in addition to the brain metastasis.  She had molecular studies performed by  [facility] that showed no actionable mutations but PDL 1 expression was 99%. She completed her palliative radiotherapy. I had a lengthy discussion with the patient today about her current condition and treatment options.  The patient understands that she has incurable condition and all the treatment will be of palliative nature.  I gave the patient the option of palliative care and hospice referral versus consideration of palliative treatment with immunotherapy single agent Ketruda (pembrolizumab) 200 mg IV every 3 weeks. The patient is interested in proceeding with the treatment with immunotherapy.  I discussed with her the adverse effect of this treatment including but not limited to immunotherapy mediated skin rash, diarrhea, inflammation of the lung, kidney, liver, thyroid or other endocrine dysfunction. She is expected to start the first cycle of this treatment next week. I will also arrange for  the patient to have chemotherapy education class before the first dose of her treatment.I willl also arrange for the patient to have follow-up visit in 4 weeks with the start of cycle #2. She was advised to call immediately if she has any concerning symptoms in the interval.  Disclaimer: This note was dictated with voice recognition software. Similar sounding words can inadvertently be transcribed and may be missed upon review. Eilleen Kempf, MD  12/13/17

## 2017-12-12 NOTE — Patient Instructions (Signed)
Pembrolizumab injection  What is this medicine?  PEMBROLIZUMAB (pem broe liz ue mab) is a monoclonal antibody. It is used to treat melanoma, head and neck cancer, Hodgkin lymphoma, non-small cell lung cancer, urothelial cancer, stomach cancer, and cancers that have a certain genetic condition.  This medicine may be used for other purposes; ask your health care provider or pharmacist if you have questions.  COMMON BRAND NAME(S): Keytruda  What should I tell my health care provider before I take this medicine?  They need to know if you have any of these conditions:  -diabetes  -immune system problems  -inflammatory bowel disease  -liver disease  -lung or breathing disease  -lupus  -organ transplant  -an unusual or allergic reaction to pembrolizumab, other medicines, foods, dyes, or preservatives  -pregnant or trying to get pregnant  -breast-feeding  How should I use this medicine?  This medicine is for infusion into a vein. It is given by a health care professional in a hospital or clinic setting.  A special MedGuide will be given to you before each treatment. Be sure to read this information carefully each time.  Talk to your pediatrician regarding the use of this medicine in children. While this drug may be prescribed for selected conditions, precautions do apply.  Overdosage: If you think you have taken too much of this medicine contact a poison control center or emergency room at once.  NOTE: This medicine is only for you. Do not share this medicine with others.  What if I miss a dose?  It is important not to miss your dose. Call your doctor or health care professional if you are unable to keep an appointment.  What may interact with this medicine?  Interactions have not been studied.  Give your health care provider a list of all the medicines, herbs, non-prescription drugs, or dietary supplements you use. Also tell them if you smoke, drink alcohol, or use illegal drugs. Some items may interact with your  medicine.  This list may not describe all possible interactions. Give your health care provider a list of all the medicines, herbs, non-prescription drugs, or dietary supplements you use. Also tell them if you smoke, drink alcohol, or use illegal drugs. Some items may interact with your medicine.  What should I watch for while using this medicine?  Your condition will be monitored carefully while you are receiving this medicine.  You may need blood work done while you are taking this medicine.  Do not become pregnant while taking this medicine or for 4 months after stopping it. Women should inform their doctor if they wish to become pregnant or think they might be pregnant. There is a potential for serious side effects to an unborn child. Talk to your health care professional or pharmacist for more information. Do not breast-feed an infant while taking this medicine or for 4 months after the last dose.  What side effects may I notice from receiving this medicine?  Side effects that you should report to your doctor or health care professional as soon as possible:  -allergic reactions like skin rash, itching or hives, swelling of the face, lips, or tongue  -bloody or black, tarry  -breathing problems  -changes in vision  -chest pain  -chills  -constipation  -cough  -dizziness or feeling faint or lightheaded  -fast or irregular heartbeat  -fever  -flushing  -hair loss  -low blood counts - this medicine may decrease the number of white blood cells, red blood cells   and platelets. You may be at increased risk for infections and bleeding.  -muscle pain  -muscle weakness  -persistent headache  -signs and symptoms of high blood sugar such as dizziness; dry mouth; dry skin; fruity breath; nausea; stomach pain; increased hunger or thirst; increased urination  -signs and symptoms of kidney injury like trouble passing urine or change in the amount of urine  -signs and symptoms of liver injury like dark urine, light-colored  stools, loss of appetite, nausea, right upper belly pain, yellowing of the eyes or skin  -stomach pain  -sweating  -weight loss  Side effects that usually do not require medical attention (report to your doctor or health care professional if they continue or are bothersome):  -decreased appetite  -diarrhea  -tiredness  This list may not describe all possible side effects. Call your doctor for medical advice about side effects. You may report side effects to FDA at 1-800-FDA-1088.  Where should I keep my medicine?  This drug is given in a hospital or clinic and will not be stored at home.  NOTE: This sheet is a summary. It may not cover all possible information. If you have questions about this medicine, talk to your doctor, pharmacist, or health care provider.   2018 Elsevier/Gold Standard (2016-03-29 12:29:36)

## 2017-12-12 NOTE — Telephone Encounter (Signed)
Appointments scheduled AVS/Calendar printed per 6/11 los °

## 2017-12-12 NOTE — Progress Notes (Signed)
START ON PATHWAY REGIMEN - Non-Small Cell Lung     A cycle is 21 days:     Pembrolizumab   **Always confirm dose/schedule in your pharmacy ordering system**  Patient Characteristics: Stage IV Metastatic, Nonsquamous, Initial Chemotherapy/Immunotherapy, PS = 0, 1, PD-L1 Expression Positive  ? 50% (TPS) AJCC T Category: T2b Current Disease Status: Distant Metastases AJCC N Category: N2 AJCC M Category: M1c AJCC 8 Stage Grouping: IVB Histology: Nonsquamous Cell ROS1 Rearrangement Status: Negative T790M Mutation Status: Not Applicable - EGFR Mutation Negative/Unknown Other Mutations/Biomarkers: No Other Actionable Mutations NTRK Gene Fusion Status: Negative PD-L1 Expression Status: PD-L1 Positive ? 50% (TPS) Chemotherapy/Immunotherapy LOT: Initial Chemotherapy/Immunotherapy Molecular Targeted Therapy: Not Appropriate ALK Translocation Status: Negative EGFR Mutation Status: Negative/Wild Type BRAF V600E Mutation Status: Negative Performance Status: PS = 0, 1 Intent of Therapy: Non-Curative / Palliative Intent, Discussed with Patient

## 2017-12-13 ENCOUNTER — Telehealth: Payer: Self-pay | Admitting: Radiation Therapy

## 2017-12-13 NOTE — Telephone Encounter (Signed)
Called to let Ms. Arras know that Dr. Lisbeth Renshaw reviewed her imaging and feels it reasonable to move forward with SRS to treat her L5 bony metastases. She did not answer, so I left a detailed message including information about her upcoming MRI, simulation and treatment appointments. I requested a return call to answer any questions she may have and to confirm the planned schedule.   Mont Dutton R.T.(R)(T) Special Procedures Navigator

## 2017-12-15 ENCOUNTER — Inpatient Hospital Stay: Payer: Medicare Other

## 2017-12-20 ENCOUNTER — Telehealth: Payer: Self-pay | Admitting: Internal Medicine

## 2017-12-20 NOTE — Telephone Encounter (Signed)
R/s app tper patietn request - appts cancelled from 6/20 and rescheduled to 6/27. Left message with new appt date and time.

## 2017-12-21 ENCOUNTER — Other Ambulatory Visit: Payer: Self-pay | Admitting: Internal Medicine

## 2017-12-21 ENCOUNTER — Inpatient Hospital Stay: Payer: Medicare Other

## 2017-12-21 ENCOUNTER — Telehealth: Payer: Self-pay

## 2017-12-21 MED ORDER — OXYCODONE HCL 5 MG PO TABS: 5.0000 mg | ORAL_TABLET | Freq: Four times a day (QID) | ORAL | 0 refills | Status: AC | PRN

## 2017-12-21 NOTE — Telephone Encounter (Signed)
Received a call from the patient requesting a refill on the Oxy IR 5 mg as she continues to have headaches and is taking the Oxy IR every 6 hours. Communicated to Dr. Mickeal Skinner and medication was refilled to requested pharmacy. Follow up phone call to patient and message was left that a follow up appointment will have to be scheduled prior to any further refills. Explained that July 15/16 would be preferred for a follow up appointment.

## 2017-12-21 NOTE — Telephone Encounter (Signed)
Patient returned call and follow up appointment made. Patient also verbalized concerns about chronic back pain. This RN advised patient to follow up with her PCP. Patient verbalized understanding and had no other questions or concerns.

## 2017-12-25 ENCOUNTER — Other Ambulatory Visit: Payer: Medicare Other

## 2017-12-25 ENCOUNTER — Telehealth: Payer: Self-pay | Admitting: *Deleted

## 2017-12-25 ENCOUNTER — Telehealth: Payer: Self-pay | Admitting: Radiation Therapy

## 2017-12-25 NOTE — Telephone Encounter (Signed)
Received a call from Jill [name] this morning. She has a cold with a bad cough and has asked that her SRS spine planning and treatment appointments be rescheduled as well as her Med Onc lab and infusion visit. I left a message with one of the Med Onc schedulers to reschedule the lab and infusion appointments. Once I have the MRI, SIM and SRS treatment rescheduled, I will inform  Jill [name] of the new dates/times.  Mont Dutton R.T.(R)(T) Special Procedures Navigator

## 2017-12-25 NOTE — Telephone Encounter (Signed)
Patient called back to confirm she is only taking 1 mg of Decadron and also advised she cannot keep her 12/28/2017 infusion appt and need to reschedule it.  Instructed on how to continue taper and sent a scheduling message to reschedule.  Patient also advised she does not have any more minutes on her phone remaining until 12/29/2017.

## 2017-12-25 NOTE — Telephone Encounter (Signed)
Maggie from pharmacy called questioning most current dose of Decadron patient is taking.  Per last office visit note on 12/12/2017 patient reported she was only taking 1 mg of Decadron.  Need to call patient and confirm that she is in fact taking only 1 mg which would be half of the last presribed dose of Decadron 2mg .  Patient is due to start [place] in 3 days and per pharmacy goal is to get her on lowest dose or no dose of Decadron as possible for most effective use of Keytruda.  Called and left message for patient to return call to confirm her current dose and discuss potential decrease of dose.    Per Vaslow if after confirming patient is truly taking 1 mg we can proceed with attempting to stop dose if patient can tolerate before her Keytruda infusion which is scheduled for 12/28/2017.  Pending return call

## 2017-12-26 ENCOUNTER — Telehealth: Payer: Self-pay | Admitting: *Deleted

## 2017-12-26 ENCOUNTER — Ambulatory Visit: Payer: Medicare Other | Admitting: Radiation Oncology

## 2017-12-26 NOTE — Telephone Encounter (Signed)
Oncology Nurse Navigator Documentation  Oncology Nurse Navigator Flowsheets 12/26/2017  Navigator Location [facility]  Navigator Encounter Type Telephone/I called patient to check on her due to her missing a few appts.  I was unable to reach her or leave vm message.   Telephone Outgoing Call  Treatment Phase Treatment  Barriers/Navigation Needs Education;Coordination of Care  Interventions Coordination of Care;Education  Coordination of Care Other  Acuity Level 1  Time Spent with Patient 15

## 2017-12-28 ENCOUNTER — Inpatient Hospital Stay: Payer: Medicare Other

## 2017-12-28 ENCOUNTER — Telehealth: Payer: Self-pay | Admitting: *Deleted

## 2017-12-28 ENCOUNTER — Telehealth: Payer: Self-pay

## 2017-12-28 NOTE — Telephone Encounter (Signed)
Unable to reach patient about missed appointment but RN KIM from collaborative stated patient would NOT be coming today

## 2017-12-28 NOTE — Telephone Encounter (Signed)
Patient had left a message on 12/25/2017 requesting me to call her back to explain Decadron Taper dose again and advised to leave on voicemail as she does not have any more minutes on her phone remaining until 12/29/2017.  Attempted to call patient back but no voicemail available.

## 2017-12-28 NOTE — Telephone Encounter (Signed)
Oncology Nurse Navigator Documentation  Oncology Nurse Navigator Flowsheets 12/28/2017  Navigator Location [facility]  Navigator Encounter Type Telephone/I called patient due to her missed appts.  I was unable to reach but did leave a vm message with my name and phone number to call.   Telephone Outgoing Call  Barriers/Navigation Needs Coordination of Care  Interventions Coordination of Care  Acuity Level 1  Time Spent with Patient 15

## 2017-12-29 ENCOUNTER — Telehealth: Payer: Self-pay | Admitting: *Deleted

## 2017-12-29 ENCOUNTER — Telehealth: Payer: Self-pay

## 2017-12-29 ENCOUNTER — Encounter: Payer: Self-pay | Admitting: *Deleted

## 2017-12-29 DIAGNOSIS — C3491 Malignant neoplasm of unspecified part of right bronchus or lung: Secondary | ICD-10-CM

## 2017-12-29 NOTE — Progress Notes (Signed)
Patient called me back and we spoke.  She was thankful for the call to check on her.  Today her barrier was education on treatment plan.  I educated and put a calender of upcoming appts in the mail.  I asked about her transportation and she states she has consistent transportation.  She also states her pain is better today and I listened as she explained. She states she is aware of appts and will be here next week. She would like to see dietitian.  I will complete referral.

## 2017-12-29 NOTE — Telephone Encounter (Signed)
Oncology Nurse Navigator Documentation  Oncology Nurse Navigator Flowsheets 12/29/2017  Navigator Location [facility]  Navigator Encounter Type Telephone/Patient called this am and I called her back but was unable to reach her but left vm message for her to call me.   Telephone Outgoing Call  Barriers/Navigation Needs Coordination of Care  Interventions Coordination of Care  Acuity Level 1  Time Spent with Patient 15

## 2017-12-29 NOTE — Telephone Encounter (Signed)
Pt called states " I missed a call" Discussed with pt 6/27 appt missed, to expect a call from scheduling regarding this appt being rescheduled. Went over all upcoming appts with pt who confirmed she will be there and will arrive approx, 30 mins prior. No further concerns.

## 2017-12-29 NOTE — Telephone Encounter (Signed)
Received call from the patient stating that she followed up with her PCP for the increased back pain and was prescribed Gabapentin 100 mg Q day and Tizanidine 4 mg Q 6 to 8 hrs as needed and is currently taking 2 to 3 tabs daily. She stated that her PCP recommended that she increase the Oxy IR from 5 mg to 10 mg for lower back pain management and wants to know if that is okay with Dr. Mickeal Skinner since he is the prescribing physician. After speaking with Dr. Mickeal Skinner contacted the patient and communicated that as previously discussed Dr. Mickeal Skinner will review the pain management medications at the next scheduled appointment but will not be making any changes to the current dosage or prescription prior to the appointment. The patient verbalized understanding and had no other questions or concerns at this time.

## 2018-01-01 ENCOUNTER — Ambulatory Visit
Admission: RE | Admit: 2018-01-01 | Discharge: 2018-01-01 | Disposition: A | Discharge status: Home / Self Care | Payer: Medicare Other | Source: Ambulatory Visit | Attending: Radiation Oncology | Admitting: Radiation Oncology

## 2018-01-01 DIAGNOSIS — C7951 Secondary malignant neoplasm of bone: Secondary | ICD-10-CM

## 2018-01-01 MED ORDER — GADOBENATE DIMEGLUMINE 529 MG/ML IV SOLN
15.0000 mL | Freq: Once | INTRAVENOUS | Status: AC | PRN
  Administered 2018-01-01: 15 mL via INTRAVENOUS

## 2018-01-02 ENCOUNTER — Ambulatory Visit
Admission: RE | Admit: 2018-01-02 | Discharge: 2018-01-02 | Disposition: A | Discharge status: Home / Self Care | Payer: Medicare Other | Source: Ambulatory Visit | Attending: Radiation Oncology | Admitting: Radiation Oncology

## 2018-01-02 ENCOUNTER — Ambulatory Visit: Payer: Medicare Other | Admitting: Radiation Oncology

## 2018-01-02 DIAGNOSIS — Z510 Encounter for antineoplastic radiation therapy: Secondary | ICD-10-CM | POA: Insufficient documentation

## 2018-01-02 DIAGNOSIS — C7951 Secondary malignant neoplasm of bone: Secondary | ICD-10-CM | POA: Insufficient documentation

## 2018-01-06 ENCOUNTER — Emergency Department (HOSPITAL_COMMUNITY): Payer: Medicare Other

## 2018-01-06 ENCOUNTER — Inpatient Hospital Stay (HOSPITAL_COMMUNITY)
Admission: RE | Admit: 2018-01-06 | Discharge: 2018-01-13 | DRG: 193 | Disposition: A | Discharge status: Home Health | Payer: Medicare Other | Attending: Family Medicine | Admitting: Family Medicine

## 2018-01-06 ENCOUNTER — Encounter (HOSPITAL_COMMUNITY): Payer: Self-pay | Admitting: Emergency Medicine

## 2018-01-06 ENCOUNTER — Other Ambulatory Visit: Payer: Self-pay

## 2018-01-06 DIAGNOSIS — M4850XA Collapsed vertebra, not elsewhere classified, site unspecified, initial encounter for fracture: Secondary | ICD-10-CM

## 2018-01-06 DIAGNOSIS — Z79899 Other long term (current) drug therapy: Secondary | ICD-10-CM

## 2018-01-06 DIAGNOSIS — J189 Pneumonia, unspecified organism: Secondary | ICD-10-CM | POA: Diagnosis not present

## 2018-01-06 DIAGNOSIS — Z6833 Body mass index (BMI) 33.0-33.9, adult: Secondary | ICD-10-CM

## 2018-01-06 DIAGNOSIS — J9601 Acute respiratory failure with hypoxia: Secondary | ICD-10-CM | POA: Diagnosis present

## 2018-01-06 DIAGNOSIS — F17210 Nicotine dependence, cigarettes, uncomplicated: Secondary | ICD-10-CM | POA: Diagnosis present

## 2018-01-06 DIAGNOSIS — C3431 Malignant neoplasm of lower lobe, right bronchus or lung: Secondary | ICD-10-CM | POA: Diagnosis present

## 2018-01-06 DIAGNOSIS — D72829 Elevated white blood cell count, unspecified: Secondary | ICD-10-CM | POA: Diagnosis present

## 2018-01-06 DIAGNOSIS — C7931 Secondary malignant neoplasm of brain: Secondary | ICD-10-CM | POA: Diagnosis present

## 2018-01-06 DIAGNOSIS — X58XXXA Exposure to other specified factors, initial encounter: Secondary | ICD-10-CM | POA: Diagnosis present

## 2018-01-06 DIAGNOSIS — R0902 Hypoxemia: Secondary | ICD-10-CM | POA: Diagnosis not present

## 2018-01-06 DIAGNOSIS — J439 Emphysema, unspecified: Secondary | ICD-10-CM | POA: Diagnosis present

## 2018-01-06 DIAGNOSIS — Y95 Nosocomial condition: Secondary | ICD-10-CM | POA: Diagnosis present

## 2018-01-06 DIAGNOSIS — Z888 Allergy status to other drugs, medicaments and biological substances status: Secondary | ICD-10-CM

## 2018-01-06 DIAGNOSIS — R000 Tachycardia, unspecified: Secondary | ICD-10-CM | POA: Diagnosis present

## 2018-01-06 DIAGNOSIS — Z23 Encounter for immunization: Secondary | ICD-10-CM

## 2018-01-06 DIAGNOSIS — Z885 Allergy status to narcotic agent status: Secondary | ICD-10-CM

## 2018-01-06 DIAGNOSIS — E871 Hypo-osmolality and hyponatremia: Secondary | ICD-10-CM | POA: Diagnosis present

## 2018-01-06 DIAGNOSIS — F209 Schizophrenia, unspecified: Secondary | ICD-10-CM | POA: Diagnosis present

## 2018-01-06 DIAGNOSIS — M549 Dorsalgia, unspecified: Secondary | ICD-10-CM

## 2018-01-06 DIAGNOSIS — Z79891 Long term (current) use of opiate analgesic: Secondary | ICD-10-CM

## 2018-01-06 DIAGNOSIS — Z7952 Long term (current) use of systemic steroids: Secondary | ICD-10-CM

## 2018-01-06 DIAGNOSIS — C3491 Malignant neoplasm of unspecified part of right bronchus or lung: Secondary | ICD-10-CM

## 2018-01-06 DIAGNOSIS — I10 Essential (primary) hypertension: Secondary | ICD-10-CM | POA: Diagnosis present

## 2018-01-06 DIAGNOSIS — Z9119 Patient's noncompliance with other medical treatment and regimen: Secondary | ICD-10-CM

## 2018-01-06 DIAGNOSIS — M4854XA Collapsed vertebra, not elsewhere classified, thoracic region, initial encounter for fracture: Secondary | ICD-10-CM | POA: Diagnosis present

## 2018-01-06 DIAGNOSIS — Z923 Personal history of irradiation: Secondary | ICD-10-CM

## 2018-01-06 DIAGNOSIS — E785 Hyperlipidemia, unspecified: Secondary | ICD-10-CM | POA: Diagnosis present

## 2018-01-06 LAB — CBC WITH DIFFERENTIAL/PLATELET
Basophils Absolute: 0 10*3/uL (ref 0.0–0.1)
Basophils Relative: 0 %
EOS ABS: 0.2 10*3/uL (ref 0.0–0.7)
Eosinophils Relative: 2 %
HCT: 42 % (ref 36.0–46.0)
HEMOGLOBIN: 14 g/dL (ref 12.0–15.0)
Lymphocytes Relative: 7 %
Lymphs Abs: 0.8 10*3/uL (ref 0.7–4.0)
MCH: 28.9 pg (ref 26.0–34.0)
MCHC: 33.3 g/dL (ref 30.0–36.0)
MCV: 86.8 fL (ref 78.0–100.0)
MONO ABS: 0.7 10*3/uL (ref 0.1–1.0)
MONOS PCT: 7 %
NEUTROS PCT: 84 %
Neutro Abs: 9.5 10*3/uL — ABNORMAL HIGH (ref 1.7–7.7)
Platelets: 267 10*3/uL (ref 150–400)
RBC: 4.84 MIL/uL (ref 3.87–5.11)
RDW: 16.1 % — ABNORMAL HIGH (ref 11.5–15.5)
WBC: 11.3 10*3/uL — ABNORMAL HIGH (ref 4.0–10.5)

## 2018-01-06 LAB — I-STAT CHEM 8, ED
BUN: 8 mg/dL (ref 6–20)
CREATININE: 0.5 mg/dL (ref 0.44–1.00)
Calcium, Ion: 1.26 mmol/L (ref 1.15–1.40)
Chloride: 97 mmol/L — ABNORMAL LOW (ref 98–111)
Glucose, Bld: 114 mg/dL — ABNORMAL HIGH (ref 70–99)
HCT: 41 % (ref 36.0–46.0)
HEMOGLOBIN: 13.9 g/dL (ref 12.0–15.0)
POTASSIUM: 3.7 mmol/L (ref 3.5–5.1)
Sodium: 131 mmol/L — ABNORMAL LOW (ref 135–145)
TCO2: 22 mmol/L (ref 22–32)

## 2018-01-06 LAB — PROTIME-INR
INR: 1.02
PROTHROMBIN TIME: 13.3 s (ref 11.4–15.2)

## 2018-01-06 LAB — I-STAT TROPONIN, ED: Troponin i, poc: 0 ng/mL (ref 0.00–0.08)

## 2018-01-06 LAB — I-STAT CG4 LACTIC ACID, ED: LACTIC ACID, VENOUS: 0.87 mmol/L (ref 0.5–1.9)

## 2018-01-06 MED ORDER — ONDANSETRON HCL 4 MG/2ML IJ SOLN
4.0000 mg | Freq: Once | INTRAMUSCULAR | Status: AC
  Administered 2018-01-06: 4 mg via INTRAVENOUS
  Filled 2018-01-06: qty 2

## 2018-01-06 MED ORDER — IOPAMIDOL (ISOVUE-370) INJECTION 76%
INTRAVENOUS | Status: AC
Start: 2018-01-06 — End: 2018-01-07
  Administered 2018-01-07: 100 mL via INTRAVENOUS
  Filled 2018-01-06: qty 100

## 2018-01-06 MED ORDER — ACETAMINOPHEN 500 MG PO TABS
1000.0000 mg | ORAL_TABLET | Freq: Once | ORAL | Status: AC
  Administered 2018-01-06: 1000 mg via ORAL
  Filled 2018-01-06: qty 2

## 2018-01-06 MED ORDER — HYDROMORPHONE HCL 1 MG/ML IJ SOLN
0.5000 mg | Freq: Once | INTRAMUSCULAR | Status: AC
  Administered 2018-01-06: 0.5 mg via INTRAVENOUS
  Filled 2018-01-06: qty 1

## 2018-01-06 NOTE — ED Notes (Signed)
Bed: WA17 Expected date:  Expected time:  Means of arrival:  Comments: ems

## 2018-01-06 NOTE — ED Provider Notes (Signed)
[facility] DEPT Provider Note   CSN: [id number] Arrival date & time: 01/06/18  2227     History   Chief Complaint Chief Complaint  Patient presents with  . Shortness of Breath  . Back Pain    HPI Jill [name] is a 57 y.o. female.  58 yo F with a chief complaint of shortness of breath.  This been going on for the past week.  She is also had worsening left-sided thoracic back pain.  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  She has a history of lung cancer.  Has been having a bit of a cough but not worse than her baseline.  No fevers or chills.  Denies sick contacts.  Has been getting radiation therapy.  Not currently on chemo.  The history is provided by the patient.  Shortness of Breath  This is a new problem. The average episode lasts 1 week. The current episode started more than 1 week ago. The problem has been gradually worsening. Associated symptoms include cough. Pertinent negatives include no fever, no headaches, no rhinorrhea, no wheezing, no chest pain and no vomiting. She has tried nothing for the symptoms. The treatment provided no relief. She has had prior hospitalizations. She has had prior ED visits. Associated medical issues include COPD and chronic lung disease.  Back Pain   Pertinent negatives include no chest pain, no fever, no headaches and no dysuria.    Past Medical History:  Diagnosis Date  . Cancer ([place])   . Hypertension   . Schizophrenia [facility])     Patient Active Problem List   Diagnosis Date Noted  . HCAP (healthcare-associated pneumonia) 01/07/2018  . Tachycardia 01/07/2018  . Encounter for antineoplastic immunotherapy 12/12/2017  . Goals of care, counseling/discussion 12/12/2017  . Primary malignant neoplasm of bronchus of right lower lobe ([place]) 11/17/2017  . Brain metastases ([place]) 11/14/2017  . Sepsis due to pneumonia ([place]) 11/13/2017  . Hemoptysis 11/13/2017  . Hypoxia  11/13/2017  . Metastatic lung cancer (metastasis from lung to other site), right ([place]) 11/13/2017  . Chronic migraine without aura 11/13/2017  . OBESITY, NOS 08/31/2006  . Schizophrenia ([place]) 08/31/2006  . TOBACCO DEPENDENCE 08/31/2006  . HYPERTENSION, BENIGN SYSTEMIC 08/31/2006  . MENOPAUSAL SYNDROME 08/31/2006  . HYDRADENITIS 08/31/2006    Past Surgical History:  Procedure Laterality Date  . HERNIA REPAIR    . KIDNEY SURGERY    . VIDEO BRONCHOSCOPY Bilateral 11/14/2017   Procedure: VIDEO BRONCHOSCOPY WITH FLUORO;  Surgeon: Chesley Mires, MD;  Location: [facility] ENDOSCOPY;  Service: Endoscopy;  Laterality: Bilateral;     OB History   None      Home Medications    Prior to Admission medications   Medication Sig Start Date End Date Taking? Authorizing Provider  acetaminophen (TYLENOL) 500 MG tablet Take 1,000 mg by mouth every 6 (six) hours as needed.   Yes [provider]  amitriptyline (ELAVIL) 50 MG tablet Take 1 tablet (50 mg total) by mouth at bedtime. 11/21/17  Yes Vaslow, Acey Lav, MD  benzonatate (TESSALON) 200 MG capsule Take 1 capsule (200 mg total) by mouth 2 (two) times daily. 11/18/17  Yes Hall, Carole N, DO  dexamethasone (DECADRON) 2 MG tablet Take 1 tablet (2 mg total) by mouth daily. 12/07/17  Yes Vaslow, Acey Lav, MD  gabapentin (NEURONTIN) 100 MG capsule Take 100-200 mg by mouth daily as needed (pain).  12/27/17  Yes [provider]  haloperidol (HALDOL) 5 MG tablet Take 5 mg by mouth 2 (two) times daily.   Yes [provider]  ipratropium-albuterol (DUONEB) 0.5-2.5 (3) MG/3ML SOLN Take 3 mLs by nebulization every 4 (four) hours as needed. 11/18/17  Yes Hall, Lorenda Cahill, DO  lisinopril-hydrochlorothiazide (PRINZIDE,ZESTORETIC) 20-25 MG tablet Take 1 tablet by mouth daily.   Yes [provider]  nicotine (NICODERM CQ - DOSED IN MG/24 HOURS) 14 mg/24hr patch Place 14 mg onto the skin daily.    Yes [provider]  ondansetron  (ZOFRAN) 8 MG tablet Take 1 tablet (8 mg total) by mouth every 8 (eight) hours as needed for nausea or vomiting. 12/06/17  Yes Vaslow, Acey Lav, MD  oxyCODONE (OXY IR/ROXICODONE) 5 MG immediate release tablet Take 1 tablet (5 mg total) by mouth every 6 (six) hours as needed for moderate pain, severe pain or breakthrough pain. 12/21/17 01/20/18 Yes Vaslow, Acey Lav, MD  polyethylene glycol (MIRALAX / Floria Raveling) packet [place] 1 PACKET TWICE DAILY 12/08/17  Yes Kyung Rudd, MD  rosuvastatin (CRESTOR) 20 MG tablet Take 20 mg by mouth daily at 6 PM.   Yes [provider]  senna-docusate (SENOKOT-S) 8.6-50 MG tablet Take 1 tablet by mouth 2 (two) times daily. 11/18/17  Yes Hall, Carole N, DO  tiZANidine (ZANAFLEX) 4 MG tablet Take 4 mg by mouth every 6 (six) hours as needed for muscle spasms.  12/27/17  Yes [provider]    Family History History reviewed. No pertinent family history.  Social History Social History   Tobacco Use  . Smoking status: Current Every Day Smoker    Packs/day: 2.00    Years: 36.00    Pack years: 72.00    Types: Cigarettes    Start date: 10/31/1981  . Smokeless tobacco: Never Used  . Tobacco comment: HAS CALLED QUIT 4 FREE ALREADY  Substance Use Topics  . Alcohol use: Never    Frequency: Never  . Drug use: Never     Allergies   Ibuprofen; Morphine and related; and Prednisone   Review of Systems Review of Systems  Constitutional: Negative for chills and fever.  HENT: Negative for congestion and rhinorrhea.   Eyes: Negative for redness and visual disturbance.  Respiratory: Positive for cough and shortness of breath. Negative for wheezing.   Cardiovascular: Negative for chest pain and palpitations.  Gastrointestinal: Negative for nausea and vomiting.  Genitourinary: Negative for dysuria and urgency.  Musculoskeletal: Positive for back pain. Negative for arthralgias and myalgias.  Skin: Negative for pallor and wound.  Neurological:  Negative for dizziness and headaches.     Physical Exam Updated Vital Signs BP 111/77   Pulse (!) 122   Temp 98.5 F (36.9 C) (Oral)   Resp (!) 29   SpO2 92%   Physical Exam  Constitutional: She is oriented to person, place, and time. She appears well-developed and well-nourished. No distress.  HENT:  Head: Normocephalic and atraumatic.  Eyes: Pupils are equal, round, and reactive to light. EOM are normal.  Neck: Normal range of motion. Neck supple.  Cardiovascular: Normal rate and regular rhythm. Exam reveals no gallop and no friction rub.  No murmur heard. Pulmonary/Chest: Effort normal. She has decreased breath sounds in the right lower field. She has no wheezes. She has no rales.  Abdominal: Soft. She exhibits no distension. There is no tenderness.  Musculoskeletal: She exhibits no edema or tenderness.  Neurological: She is alert and oriented to person, place, and time.  Skin: Skin  is warm and dry. She is not diaphoretic.  Psychiatric: She has a normal mood and affect. Her behavior is normal.  Nursing note and vitals reviewed.    ED Treatments / Results  Labs (all labs ordered are listed, but only abnormal results are displayed) Labs Reviewed  COMPREHENSIVE METABOLIC PANEL - Abnormal; Notable for the following components:      Result Value   Sodium 133 (*)    Glucose, Bld 115 (*)    Calcium 10.6 (*)    Alkaline Phosphatase 142 (*)    All other components within normal limits  CBC WITH DIFFERENTIAL/PLATELET - Abnormal; Notable for the following components:   WBC 11.3 (*)    RDW 16.1 (*)    Neutro Abs 9.5 (*)    All other components within normal limits  I-STAT CHEM 8, ED - Abnormal; Notable for the following components:   Sodium 131 (*)    Chloride 97 (*)    Glucose, Bld 114 (*)    All other components within normal limits  CULTURE, BLOOD (ROUTINE X 2)  CULTURE, BLOOD (ROUTINE X 2)  CULTURE, BLOOD (ROUTINE X 2)  CULTURE, BLOOD (ROUTINE X 2)  CULTURE,  EXPECTORATED SPUTUM-ASSESSMENT  GRAM STAIN  BRAIN NATRIURETIC PEPTIDE  PROTIME-INR  URINALYSIS, ROUTINE W REFLEX MICROSCOPIC  HIV ANTIBODY (ROUTINE TESTING)  STREP PNEUMONIAE URINARY ANTIGEN  LEGIONELLA PNEUMOPHILA SEROGP 1 UR AG  I-STAT CG4 LACTIC ACID, ED  I-STAT TROPONIN, ED    EKG EKG Interpretation  Date/Time:  Saturday January 06 2018 23:07:26 EDT Ventricular Rate:  126 PR Interval:    QRS Duration: 70 QT Interval:  288 QTC Calculation: 417 R Axis:   57 Text Interpretation:  Sinus tachycardia Low voltage, precordial leads Since last tracing rate faster Confirmed by Deno Etienne [phone]) on 01/06/2018 11:26:29 PM   Radiology Dg Chest 2 View  Result Date: 01/06/2018 CLINICAL DATA:  Short of breath EXAM: CHEST - 2 VIEW COMPARISON:  CT 11/12/2017, radiograph 10/24/2017 FINDINGS: Right infrahilar consolidation presumably corresponding to CT demonstrated lung mass. Increased atelectasis or infiltrate at the left base. Small pleural effusions. Stable heart size. No pneumothorax. IMPRESSION: 1. Development of small pleural effusion and streaky infiltrate at the left lung base. 2. Right hilar and infrahilar opacity presumably corresponding to nodes and consolidation noted on prior CT. Electronically Signed   By: Donavan Foil M.D.   On: 01/06/2018 23:08   Ct Angio Chest Pe W And/or Wo Contrast  Result Date: 01/07/2018 CLINICAL DATA:  Shortness of breath for 1 week. Back pain and constipation for 2 weeks. Patient is receiving radiation treatment for lung cancer. EXAM: CT ANGIOGRAPHY CHEST WITH CONTRAST TECHNIQUE: Multidetector CT imaging of the chest was performed using the standard protocol during bolus administration of intravenous contrast. Multiplanar CT image reconstructions and MIPs were obtained to evaluate the vascular anatomy. CONTRAST:  173mL ISOVUE-370 IOPAMIDOL (ISOVUE-370) INJECTION 76% COMPARISON:  11/12/2017 FINDINGS: Cardiovascular: There is good opacification of the central and  segmental pulmonary arteries. No focal filling defects are demonstrated. No evidence of significant pulmonary embolus. Normal caliber thoracic aorta. No aortic dissection. Scattered aortic calcifications. Normal heart size. No pericardial effusion. Coronary artery calcifications. Mediastinum/Nodes: Esophagus is decompressed. Small esophageal hiatal hernia. Mild prominence of lymph nodes in the right paratracheal region and right hilum. Largest nodes measure up to about 12 mm in diameter. No change in appearance of lymph nodes since the previous study. Lungs/Pleura: Right posterior hilar mass lesion measuring about 3.1 cm diameter. Size is similar to  previous study. Since the previous study, there is interval development of consolidation and volume loss in the right lower lobe. This likely represents postobstructive change with the mass lesion likely causing obstruction of the right lower lobe bronchus. There is also focal consolidation in the left lower lung. This could be due to atelectasis or pneumonia. Emphysematous changes in the lungs. No pneumothorax. No pleural effusions. Upper Abdomen: No acute abnormality. Musculoskeletal: Compression of a midthoracic vertebral body, probably T8, new since previous study. This is likely a metastatic lesion. Lucent lesion, likely metastasis in the left posterior second rib. Review of the MIP images confirms the above findings. IMPRESSION: 1. No evidence of significant pulmonary embolus. 2. Re-identified occasion of a 3 cm right hilar mass, similar to previous study. Borderline mediastinal lymph nodes without change. 3. New collapse and consolidation of the right lower lobe, likely due to obstruction. 4. New consolidation in the left lower lung likely due to atelectasis or pneumonia. 5. Emphysematous changes in the lungs. 6. Aortic atherosclerosis and coronary artery calcifications. 7. New compression of the T8 vertebra, likely metastatic. Lucent metastatic lesion  demonstrated in the left second rib. Aortic Atherosclerosis (ICD10-I70.0) and Emphysema (ICD10-J43.9). Electronically Signed   By: Lucienne Capers M.D.   On: 01/07/2018 01:41    Procedures Procedures (including critical care time)  Medications Ordered in ED Medications  vancomycin (VANCOCIN) 1,500 mg in sodium chloride 0.9 % 500 mL IVPB (has no administration in time range)  enoxaparin (LOVENOX) injection 40 mg (has no administration in time range)  0.9 %  sodium chloride infusion (has no administration in time range)  ceFEPIme (MAXIPIME) 1 g in sodium chloride 0.9 % 100 mL IVPB (has no administration in time range)  ceFEPIme (MAXIPIME) 2 g in sodium chloride 0.9 % 100 mL IVPB (has no administration in time range)  ondansetron (ZOFRAN) injection 4 mg (4 mg Intravenous Given 01/06/18 2343)  HYDROmorphone (DILAUDID) injection 0.5 mg (0.5 mg Intravenous Given 01/06/18 2344)  acetaminophen (TYLENOL) tablet 1,000 mg (1,000 mg Oral Given 01/06/18 2344)  iopamidol (ISOVUE-370) 76 % injection 100 mL (100 mLs Intravenous Contrast Given 01/07/18 0037)     Initial Impression / Assessment and Plan / ED Course  I have reviewed the triage vital signs and the nursing notes.  Pertinent labs & imaging results that were available during my care of the patient were reviewed by me and considered in my medical decision making (see chart for details).     57 yo F with a significant past medical history of lung cancer comes in with a chief complaint of shortness of breath.  Worsening over the past week.  Chest x-ray reviewed by me with no significant change from prior.  Nothing to explain her new 4 L oxygen requirement.  Will obtain a CT Angio of the chest to evaluate for PE.  CT scan without PE but concern for left lower lobe pneumonia.  Patient was in the hospital within the past month and a half will start on H CAP antibiotics.  Admit.  The patients results and plan were reviewed and discussed.   Any x-rays  performed were independently reviewed by myself.   Differential diagnosis were considered with the presenting HPI.  Medications  vancomycin (VANCOCIN) 1,500 mg in sodium chloride 0.9 % 500 mL IVPB (has no administration in time range)  enoxaparin (LOVENOX) injection 40 mg (has no administration in time range)  0.9 %  sodium chloride infusion (has no administration in time range)  ceFEPIme (MAXIPIME)  1 g in sodium chloride 0.9 % 100 mL IVPB (has no administration in time range)  ceFEPIme (MAXIPIME) 2 g in sodium chloride 0.9 % 100 mL IVPB (has no administration in time range)  ondansetron (ZOFRAN) injection 4 mg (4 mg Intravenous Given 01/06/18 2343)  HYDROmorphone (DILAUDID) injection 0.5 mg (0.5 mg Intravenous Given 01/06/18 2344)  acetaminophen (TYLENOL) tablet 1,000 mg (1,000 mg Oral Given 01/06/18 2344)  iopamidol (ISOVUE-370) 76 % injection 100 mL (100 mLs Intravenous Contrast Given 01/07/18 0037)    Vitals:   01/06/18 2253 01/06/18 2254 01/06/18 2330 01/07/18 0115  BP: 113/79  110/79 111/77  Pulse: (!) 124  (!) 122 (!) 122  Resp: (!) 24  (!) 30 (!) 29  Temp: 98.5 F (36.9 C)     TempSrc: Oral     SpO2: (!) 85% 91% 92% 92%    Final diagnoses:  HCAP (healthcare-associated pneumonia)  Hypoxia    Admission/ observation were discussed with the admitting physician, patient and/or family and they are comfortable with the plan.    Final Clinical Impressions(s) / ED Diagnoses   Final diagnoses:  HCAP (healthcare-associated pneumonia)  Hypoxia    ED Discharge Orders    None       Deno Etienne, DO 01/07/18 6203

## 2018-01-06 NOTE — ED Notes (Signed)
Patient 85% RA. Placed on 5 L [place]

## 2018-01-06 NOTE — ED Triage Notes (Signed)
Pt BIB EMS from home, hx lung cancer and a brain cyst. Patient has had shortness of breath x1 week, mostly exertional. Patient also complaining of back pain and constipation x2 weeks. Per EMS, ronchi in R lung. Patient is receiving radiation treatments. Patient states that she's attempting to quit smoking, last cig at 1400 today.

## 2018-01-06 NOTE — ED Notes (Signed)
Patient transported to X-ray 

## 2018-01-07 ENCOUNTER — Inpatient Hospital Stay (HOSPITAL_COMMUNITY): Payer: Medicare Other

## 2018-01-07 ENCOUNTER — Encounter (HOSPITAL_COMMUNITY): Payer: Self-pay | Admitting: Internal Medicine

## 2018-01-07 ENCOUNTER — Emergency Department (HOSPITAL_COMMUNITY): Payer: Medicare Other

## 2018-01-07 DIAGNOSIS — F209 Schizophrenia, unspecified: Secondary | ICD-10-CM | POA: Diagnosis present

## 2018-01-07 DIAGNOSIS — Z6833 Body mass index (BMI) 33.0-33.9, adult: Secondary | ICD-10-CM | POA: Diagnosis not present

## 2018-01-07 DIAGNOSIS — R000 Tachycardia, unspecified: Secondary | ICD-10-CM | POA: Diagnosis not present

## 2018-01-07 DIAGNOSIS — F17210 Nicotine dependence, cigarettes, uncomplicated: Secondary | ICD-10-CM | POA: Diagnosis present

## 2018-01-07 DIAGNOSIS — I10 Essential (primary) hypertension: Secondary | ICD-10-CM | POA: Diagnosis present

## 2018-01-07 DIAGNOSIS — M4854XA Collapsed vertebra, not elsewhere classified, thoracic region, initial encounter for fracture: Secondary | ICD-10-CM | POA: Diagnosis present

## 2018-01-07 DIAGNOSIS — E871 Hypo-osmolality and hyponatremia: Secondary | ICD-10-CM | POA: Diagnosis present

## 2018-01-07 DIAGNOSIS — X58XXXA Exposure to other specified factors, initial encounter: Secondary | ICD-10-CM | POA: Diagnosis not present

## 2018-01-07 DIAGNOSIS — M549 Dorsalgia, unspecified: Secondary | ICD-10-CM | POA: Diagnosis present

## 2018-01-07 DIAGNOSIS — J439 Emphysema, unspecified: Secondary | ICD-10-CM | POA: Diagnosis present

## 2018-01-07 DIAGNOSIS — Z79899 Other long term (current) drug therapy: Secondary | ICD-10-CM | POA: Diagnosis not present

## 2018-01-07 DIAGNOSIS — C3490 Malignant neoplasm of unspecified part of unspecified bronchus or lung: Secondary | ICD-10-CM | POA: Diagnosis not present

## 2018-01-07 DIAGNOSIS — Y95 Nosocomial condition: Secondary | ICD-10-CM | POA: Diagnosis not present

## 2018-01-07 DIAGNOSIS — Z923 Personal history of irradiation: Secondary | ICD-10-CM | POA: Diagnosis not present

## 2018-01-07 DIAGNOSIS — J189 Pneumonia, unspecified organism: Secondary | ICD-10-CM | POA: Diagnosis present

## 2018-01-07 DIAGNOSIS — R0902 Hypoxemia: Secondary | ICD-10-CM | POA: Diagnosis present

## 2018-01-07 DIAGNOSIS — Z888 Allergy status to other drugs, medicaments and biological substances status: Secondary | ICD-10-CM | POA: Diagnosis not present

## 2018-01-07 DIAGNOSIS — D72829 Elevated white blood cell count, unspecified: Secondary | ICD-10-CM | POA: Diagnosis present

## 2018-01-07 DIAGNOSIS — S22060A Wedge compression fracture of T7-T8 vertebra, initial encounter for closed fracture: Secondary | ICD-10-CM | POA: Diagnosis not present

## 2018-01-07 DIAGNOSIS — Z510 Encounter for antineoplastic radiation therapy: Secondary | ICD-10-CM | POA: Diagnosis not present

## 2018-01-07 DIAGNOSIS — C3431 Malignant neoplasm of lower lobe, right bronchus or lung: Secondary | ICD-10-CM | POA: Diagnosis present

## 2018-01-07 DIAGNOSIS — C7931 Secondary malignant neoplasm of brain: Secondary | ICD-10-CM | POA: Diagnosis present

## 2018-01-07 DIAGNOSIS — Z23 Encounter for immunization: Secondary | ICD-10-CM | POA: Diagnosis present

## 2018-01-07 DIAGNOSIS — Z7952 Long term (current) use of systemic steroids: Secondary | ICD-10-CM | POA: Diagnosis not present

## 2018-01-07 DIAGNOSIS — J449 Chronic obstructive pulmonary disease, unspecified: Secondary | ICD-10-CM | POA: Diagnosis not present

## 2018-01-07 DIAGNOSIS — I5030 Unspecified diastolic (congestive) heart failure: Secondary | ICD-10-CM | POA: Diagnosis not present

## 2018-01-07 DIAGNOSIS — Z79891 Long term (current) use of opiate analgesic: Secondary | ICD-10-CM | POA: Diagnosis not present

## 2018-01-07 DIAGNOSIS — E785 Hyperlipidemia, unspecified: Secondary | ICD-10-CM | POA: Diagnosis present

## 2018-01-07 DIAGNOSIS — J9601 Acute respiratory failure with hypoxia: Secondary | ICD-10-CM | POA: Diagnosis present

## 2018-01-07 DIAGNOSIS — Z9119 Patient's noncompliance with other medical treatment and regimen: Secondary | ICD-10-CM | POA: Diagnosis not present

## 2018-01-07 DIAGNOSIS — Z885 Allergy status to narcotic agent status: Secondary | ICD-10-CM | POA: Diagnosis not present

## 2018-01-07 LAB — COMPREHENSIVE METABOLIC PANEL
ALK PHOS: 142 U/L — AB (ref 38–126)
ALT: 22 U/L (ref 0–44)
AST: 18 U/L (ref 15–41)
Albumin: 4.1 g/dL (ref 3.5–5.0)
Anion gap: 12 (ref 5–15)
BILIRUBIN TOTAL: 0.9 mg/dL (ref 0.3–1.2)
BUN: 11 mg/dL (ref 6–20)
CO2: 23 mmol/L (ref 22–32)
Calcium: 10.6 mg/dL — ABNORMAL HIGH (ref 8.9–10.3)
Chloride: 98 mmol/L (ref 98–111)
Creatinine, Ser: 0.47 mg/dL (ref 0.44–1.00)
GFR calc Af Amer: >60 mL/min (ref >60)
GLUCOSE: 115 mg/dL — AB (ref 70–99)
POTASSIUM: 3.7 mmol/L (ref 3.5–5.1)
Sodium: 133 mmol/L — ABNORMAL LOW (ref 135–145)
TOTAL PROTEIN: 7.6 g/dL (ref 6.5–8.1)

## 2018-01-07 LAB — BRAIN NATRIURETIC PEPTIDE: B NATRIURETIC PEPTIDE 5: 19.3 pg/mL (ref 0.0–100.0)

## 2018-01-07 LAB — HIV ANTIBODY (ROUTINE TESTING W REFLEX): HIV Screen 4th Generation wRfx: NONREACTIVE

## 2018-01-07 LAB — MRSA PCR SCREENING: MRSA by PCR: NEGATIVE

## 2018-01-07 MED ORDER — HYDROMORPHONE HCL 1 MG/ML IJ SOLN
0.5000 mg | Freq: Once | INTRAMUSCULAR | Status: AC
  Administered 2018-01-07: 0.5 mg via INTRAVENOUS
  Filled 2018-01-07: qty 1

## 2018-01-07 MED ORDER — IPRATROPIUM-ALBUTEROL 0.5-2.5 (3) MG/3ML IN SOLN
3.0000 mL | RESPIRATORY_TRACT | Status: DC | PRN
  Administered 2018-01-10 – 2018-01-13 (×2): 3 mL via RESPIRATORY_TRACT
  Filled 2018-01-07 (×2): qty 3

## 2018-01-07 MED ORDER — BISACODYL 10 MG RE SUPP: 10.0000 mg | Freq: Every day | RECTAL | Status: DC | PRN

## 2018-01-07 MED ORDER — BENZONATATE 100 MG PO CAPS
200.0000 mg | ORAL_CAPSULE | Freq: Two times a day (BID) | ORAL | Status: DC
  Administered 2018-01-07 – 2018-01-13 (×11): 200 mg via ORAL
  Filled 2018-01-07 (×13): qty 2

## 2018-01-07 MED ORDER — PNEUMOCOCCAL VAC POLYVALENT 25 MCG/0.5ML IJ INJ
0.5000 mL | INJECTION | INTRAMUSCULAR | Status: AC
  Administered 2018-01-08: 0.5 mL via INTRAMUSCULAR
  Filled 2018-01-07: qty 0.5

## 2018-01-07 MED ORDER — NICOTINE 21 MG/24HR TD PT24
21.0000 mg | MEDICATED_PATCH | Freq: Every day | TRANSDERMAL | Status: DC
  Administered 2018-01-07 – 2018-01-13 (×7): 21 mg via TRANSDERMAL
  Filled 2018-01-07 (×7): qty 1

## 2018-01-07 MED ORDER — TIZANIDINE HCL 4 MG PO TABS
4.0000 mg | ORAL_TABLET | Freq: Four times a day (QID) | ORAL | Status: DC | PRN
Start: 2018-01-07 — End: 2018-01-13
  Administered 2018-01-07 – 2018-01-13 (×4): 4 mg via ORAL
  Filled 2018-01-07 (×4): qty 1

## 2018-01-07 MED ORDER — IOPAMIDOL (ISOVUE-370) INJECTION 76%
100.0000 mL | Freq: Once | INTRAVENOUS | Status: AC | PRN
  Administered 2018-01-07: 100 mL via INTRAVENOUS

## 2018-01-07 MED ORDER — HYDROCHLOROTHIAZIDE 25 MG PO TABS
25.0000 mg | ORAL_TABLET | Freq: Every day | ORAL | Status: DC
  Administered 2018-01-07: 25 mg via ORAL
  Filled 2018-01-07: qty 1

## 2018-01-07 MED ORDER — OXYCODONE HCL 5 MG PO TABS
5.0000 mg | ORAL_TABLET | ORAL | Status: DC | PRN
  Administered 2018-01-07: 10 mg via ORAL
  Filled 2018-01-07: qty 2

## 2018-01-07 MED ORDER — SODIUM CHLORIDE 0.9 % IV SOLN
INTRAVENOUS | Status: AC
  Administered 2018-01-07: 02:00:00 via INTRAVENOUS

## 2018-01-07 MED ORDER — SODIUM CHLORIDE 0.9 % IV SOLN
2.0000 g | Freq: Once | INTRAVENOUS | Status: AC
  Administered 2018-01-07: 2 g via INTRAVENOUS
  Filled 2018-01-07: qty 2

## 2018-01-07 MED ORDER — LISINOPRIL-HYDROCHLOROTHIAZIDE 20-25 MG PO TABS: 1.0000 | ORAL_TABLET | Freq: Every day | ORAL | Status: DC

## 2018-01-07 MED ORDER — OXYCODONE HCL 5 MG PO TABS
5.0000 mg | ORAL_TABLET | Freq: Four times a day (QID) | ORAL | Status: DC | PRN
  Administered 2018-01-07: 5 mg via ORAL
  Filled 2018-01-07: qty 1

## 2018-01-07 MED ORDER — PIPERACILLIN-TAZOBACTAM 3.375 G IVPB 30 MIN: 3.3750 g | Freq: Once | INTRAVENOUS | Status: DC

## 2018-01-07 MED ORDER — SODIUM CHLORIDE 0.9 % IV SOLN
1.0000 g | Freq: Three times a day (TID) | INTRAVENOUS | Status: DC
  Administered 2018-01-07 – 2018-01-09 (×5): 1 g via INTRAVENOUS
  Filled 2018-01-07 (×8): qty 1

## 2018-01-07 MED ORDER — LISINOPRIL 20 MG PO TABS
20.0000 mg | ORAL_TABLET | Freq: Every day | ORAL | Status: DC
  Administered 2018-01-07 – 2018-01-13 (×7): 20 mg via ORAL
  Filled 2018-01-07 (×7): qty 1

## 2018-01-07 MED ORDER — SENNOSIDES-DOCUSATE SODIUM 8.6-50 MG PO TABS
1.0000 | ORAL_TABLET | Freq: Two times a day (BID) | ORAL | Status: DC
  Administered 2018-01-07 – 2018-01-13 (×13): 1 via ORAL
  Filled 2018-01-07 (×13): qty 1

## 2018-01-07 MED ORDER — DEXAMETHASONE 2 MG PO TABS
2.0000 mg | ORAL_TABLET | Freq: Every day | ORAL | Status: DC
  Administered 2018-01-07 – 2018-01-13 (×7): 2 mg via ORAL
  Filled 2018-01-07 (×7): qty 1

## 2018-01-07 MED ORDER — OXYCODONE HCL 5 MG PO TABS
5.0000 mg | ORAL_TABLET | ORAL | Status: DC | PRN
  Administered 2018-01-07 – 2018-01-13 (×20): 10 mg via ORAL
  Filled 2018-01-07 (×20): qty 2

## 2018-01-07 MED ORDER — NICOTINE 14 MG/24HR TD PT24
14.0000 mg | MEDICATED_PATCH | Freq: Every day | TRANSDERMAL | Status: DC
  Administered 2018-01-07: 14 mg via TRANSDERMAL
  Filled 2018-01-07: qty 1

## 2018-01-07 MED ORDER — SODIUM CHLORIDE 0.9 % IV SOLN
1500.0000 mg | Freq: Once | INTRAVENOUS | Status: AC
  Administered 2018-01-07: 1500 mg via INTRAVENOUS
  Filled 2018-01-07: qty 1500

## 2018-01-07 MED ORDER — ROSUVASTATIN CALCIUM 20 MG PO TABS
20.0000 mg | ORAL_TABLET | Freq: Every day | ORAL | Status: DC
  Administered 2018-01-07 – 2018-01-12 (×6): 20 mg via ORAL
  Filled 2018-01-07 (×6): qty 1

## 2018-01-07 MED ORDER — AMITRIPTYLINE HCL 25 MG PO TABS
50.0000 mg | ORAL_TABLET | Freq: Every day | ORAL | Status: DC
  Administered 2018-01-07 – 2018-01-12 (×6): 50 mg via ORAL
  Filled 2018-01-07 (×6): qty 2

## 2018-01-07 MED ORDER — VANCOMYCIN HCL IN DEXTROSE 1-5 GM/200ML-% IV SOLN
1000.0000 mg | INTRAVENOUS | Status: DC
  Administered 2018-01-08: 1000 mg via INTRAVENOUS
  Filled 2018-01-07: qty 200

## 2018-01-07 MED ORDER — ENOXAPARIN SODIUM 40 MG/0.4ML ~~LOC~~ SOLN
40.0000 mg | SUBCUTANEOUS | Status: DC
  Administered 2018-01-07 – 2018-01-13 (×7): 40 mg via SUBCUTANEOUS
  Filled 2018-01-07 (×7): qty 0.4

## 2018-01-07 MED ORDER — SODIUM CHLORIDE 0.9 % IV SOLN
INTRAVENOUS | Status: DC
  Administered 2018-01-07: 19:00:00 via INTRAVENOUS

## 2018-01-07 MED ORDER — POLYETHYLENE GLYCOL 3350 17 G PO PACK
17.0000 g | PACK | Freq: Two times a day (BID) | ORAL | Status: DC
  Administered 2018-01-07 – 2018-01-12 (×11): 17 g via ORAL
  Filled 2018-01-07 (×13): qty 1

## 2018-01-07 MED ORDER — HALOPERIDOL 5 MG PO TABS
5.0000 mg | ORAL_TABLET | Freq: Two times a day (BID) | ORAL | Status: DC
  Administered 2018-01-07 – 2018-01-13 (×13): 5 mg via ORAL
  Filled 2018-01-07 (×15): qty 1

## 2018-01-07 MED ORDER — HYDROMORPHONE HCL 1 MG/ML IJ SOLN
0.5000 mg | INTRAMUSCULAR | Status: DC | PRN
  Administered 2018-01-07 – 2018-01-08 (×3): 0.5 mg via INTRAVENOUS
  Filled 2018-01-07 (×4): qty 0.5

## 2018-01-07 MED ORDER — ACETAMINOPHEN 325 MG PO TABS
650.0000 mg | ORAL_TABLET | ORAL | Status: DC | PRN
  Administered 2018-01-07 – 2018-01-13 (×9): 650 mg via ORAL
  Filled 2018-01-07 (×10): qty 2

## 2018-01-07 NOTE — Progress Notes (Signed)
Patient ID: Jill [name], female   DOB: [birth year], 57 y.o.   MRN: [id number] Aware of request for possible VP/KP/osteocool ablation on patient.  Imaging studies were reviewed by Dr. Pascal Lux.  He recommends obtaining thoracic MRI with contrast and making sure oncology in agreement with above plans before proceeding with any further intervention. Dr. Karleen Hampshire notified.

## 2018-01-07 NOTE — Progress Notes (Signed)
Patient has to have a CT spine, notified PCP for order for monitor to be taken off for tests/procedures.. Also, the continuous cardiac monitoring order expires in 6 hours. Notified the PCP for a new order.

## 2018-01-07 NOTE — Progress Notes (Signed)
Pharmacy Antibiotic Note  Jill [name] is a 57 y.o. female admitted on 01/06/2018 with pneumonia.  Pharmacy has been consulted for vancomycin dosing.  Plan: Vancomycin 1500mg  iv x1, then 1gm iv q24hr  Goal AUC = 400 - 500 for all indications, except meningitis (goal AUC > 500 and Cmin 15-20 mcg/mL)   Height: 5' 0.98" (154.9 cm) Weight: 175 lb 8 oz (79.6 kg) IBW/kg (Calculated) : 47.76  Temp (24hrs), Avg:98.1 F (36.7 C), Min:97.7 F (36.5 C), Max:98.5 F (36.9 C)  Recent Labs  Lab 01/06/18 2318 01/06/18 2327  WBC 11.3*  --   CREATININE 0.47 0.50  LATICACIDVEN  --  0.87    Estimated Creatinine Clearance: 75 mL/min (by C-G formula based on SCr of 0.5 mg/dL).    Allergies  Allergen Reactions  . Ibuprofen Nausea And Vomiting  . Morphine And Related     Headaches, Vomiting  . Prednisone     Dizziness and pain    Antimicrobials this admission: Vancomycin 01/07/2018 >> Cefepime 01/07/2018 >>   Dose adjustments this admission: -  Microbiology results: -  Thank you for allowing pharmacy to be a part of this patient's care.  Nani Skillern Crowford 01/07/2018 6:24 AM

## 2018-01-07 NOTE — ED Notes (Signed)
Lactic cancelled per EDP Tyrone Nine verbal order

## 2018-01-07 NOTE — Progress Notes (Signed)
57 year old lady with metastatic stage 4 lung cancer, hypertension, hyperlipidemia, schizophrenia, tobacco abuse, admitted for worsening SOB.CT chest showed no evidence of PE, new consolidation in the left lower lung due to pneumonia and new compression fracture of the T8 vertebra.    Assessment: 1. Health care associated pneumonia.  2. Compression fracture of T8 vertebra.  3. Metastatic lung cancer.  4. Tachycardia 5. Hyponatremia.  6. Mild leukocytosis.     Plan: 1. Continue with IV antibiotics, get legionella and strep urine antigen.  2. Get thoracic CT for the compression fracture.  3. IR CONSULT.  4. Hydrate.  5. Repeat labs in am.    Hosie Poisson, MD [phone]

## 2018-01-07 NOTE — H&P (Signed)
TRH H&P   Patient Demographics:    Jill [name], is a 57 y.o. female  MRN: [id number]   DOB - 1960-08-04  Admit Date - 01/06/2018  Outpatient Primary MD for the patient is Aletha Halim., PA-C  Referring MD/NP/PA: Deno Etienne  Outpatient Specialists:   [facility] Bears  Patient coming from: home  Chief Complaint  Patient presents with  . Shortness of Breath  . Back Pain      HPI:    Jill [name]  is a 57 y.o. female, w hypertension, schizophrenia, nonsmall cell  lung cancer (adenocarcinoma)(stage 4) apparently presents with increase in dyspnea starting today as well as cough (dry) as well as left back pain. Pt denies fever, chills, cp, palp, n/v, diarrhea, brbpr, black stool.    In Ed,  CTA chest IMPRESSION: 1. No evidence of significant pulmonary embolus. 2. Re-identified occasion of a 3 cm right hilar mass, similar toprevious study. Borderline mediastinal lymph nodes without change.3. New collapse and consolidation of the right lower lobe, likelydue to obstruction. 4. New consolidation in the left lower lung likely due to atelectasis or pneumonia. 5. Emphysematous changes in the lungs. 6. Aortic atherosclerosis and coronary artery calcifications. 7. New compression of the T8 vertebra, likely metastatic. Lucent metastatic lesion demonstrated in the left second rib.   Na 133, K 3.7,  Bun 11, Creatinine 0.47 Calcium 10.6,  Alb 4.1 Ast 18, Alt 22 Alk phos 142, T. Bili 0.9  BNP 19.3  Wbc 11.3, Hgb 14.0, Plt 267  INR 1.02  Trop 0.00 Lactic acid 0.87  Pt will be admitted for pneumonia, RLL and LLL   Review of systems:    In addition to the HPI above, No Fever-chills, No Headache, No changes with Vision or hearing, No problems swallowing food or Liquids,  No Abdominal pain, No Nausea or Vommitting, Bowel movements are regular, No Blood in stool or  Urine, No dysuria, No new skin rashes or bruises, No new joints pains-aches,  No new weakness, tingling, numbness in any extremity, No recent weight gain or loss, No polyuria, polydypsia or polyphagia, No significant Mental Stressors.  A full 10 point Review of Systems was done, except as stated above, all other Review of Systems were negative.   With Past History of the following :    Past Medical History:  Diagnosis Date  . Cancer ([place])   . Hypertension   . Schizophrenia [facility])       Past Surgical History:  Procedure Laterality Date  . HERNIA REPAIR    . KIDNEY SURGERY    . VIDEO BRONCHOSCOPY Bilateral 11/14/2017   Procedure: VIDEO BRONCHOSCOPY WITH FLUORO;  Surgeon: Chesley Mires, MD;  Location: [facility];  Service: Endoscopy;  Laterality: Bilateral;      Social History:     Social History   Tobacco Use  . Smoking status: Current Every Day Smoker  Packs/day: 2.00    Years: 36.00    Pack years: 72.00    Types: Cigarettes    Start date: 10/31/1981  . Smokeless tobacco: Never Used  . Tobacco comment: HAS CALLED QUIT 4 FREE ALREADY  Substance Use Topics  . Alcohol use: Never    Frequency: Never     Lives - at home  Mobility - walks by self   Family History :     Family History  Problem Relation Age of Onset  . Cancer Mother       Home Medications:   Prior to Admission medications   Medication Sig Start Date End Date Taking? Authorizing Provider  acetaminophen (TYLENOL) 500 MG tablet Take 1,000 mg by mouth every 6 (six) hours as needed.   Yes [provider]  amitriptyline (ELAVIL) 50 MG tablet Take 1 tablet (50 mg total) by mouth at bedtime. 11/21/17  Yes Vaslow, Acey Lav, MD  benzonatate (TESSALON) 200 MG capsule Take 1 capsule (200 mg total) by mouth 2 (two) times daily. 11/18/17  Yes Hall, Carole N, DO  dexamethasone (DECADRON) 2 MG tablet Take 1 tablet (2 mg total) by mouth daily. 12/07/17  Yes Vaslow, Acey Lav, MD  gabapentin  (NEURONTIN) 100 MG capsule Take 100-200 mg by mouth daily as needed (pain).  12/27/17  Yes [provider]  haloperidol (HALDOL) 5 MG tablet Take 5 mg by mouth 2 (two) times daily.   Yes [provider]  ipratropium-albuterol (DUONEB) 0.5-2.5 (3) MG/3ML SOLN Take 3 mLs by nebulization every 4 (four) hours as needed. 11/18/17  Yes Hall, Lorenda Cahill, DO  lisinopril-hydrochlorothiazide (PRINZIDE,ZESTORETIC) 20-25 MG tablet Take 1 tablet by mouth daily.   Yes [provider]  nicotine (NICODERM CQ - DOSED IN MG/24 HOURS) 14 mg/24hr patch Place 14 mg onto the skin daily.    Yes [provider]  ondansetron (ZOFRAN) 8 MG tablet Take 1 tablet (8 mg total) by mouth every 8 (eight) hours as needed for nausea or vomiting. 12/06/17  Yes Vaslow, Acey Lav, MD  oxyCODONE (OXY IR/ROXICODONE) 5 MG immediate release tablet Take 1 tablet (5 mg total) by mouth every 6 (six) hours as needed for moderate pain, severe pain or breakthrough pain. 12/21/17 01/20/18 Yes Vaslow, Acey Lav, MD  polyethylene glycol (MIRALAX / Floria Raveling) packet [place] 1 PACKET TWICE DAILY 12/08/17  Yes Kyung Rudd, MD  rosuvastatin (CRESTOR) 20 MG tablet Take 20 mg by mouth daily at 6 PM.   Yes [provider]  senna-docusate (SENOKOT-S) 8.6-50 MG tablet Take 1 tablet by mouth 2 (two) times daily. 11/18/17  Yes Hall, Carole N, DO  tiZANidine (ZANAFLEX) 4 MG tablet Take 4 mg by mouth every 6 (six) hours as needed for muscle spasms.  12/27/17  Yes [provider]     Allergies:     Allergies  Allergen Reactions  . Ibuprofen Nausea And Vomiting  . Morphine And Related     Headaches, Vomiting  . Prednisone     Dizziness and pain     Physical Exam:   Vitals  Blood pressure 107/76, pulse (!) 134, temperature 98.5 F (36.9 C), temperature source Oral, resp. rate 18, SpO2 93 %.   1. General  lying in bed in NAD,    2. Normal affect and insight, Not Suicidal or Homicidal, Awake Alert,  Oriented X 3.  3. No F.N deficits, ALL C.Nerves Intact, Strength 5/5 all 4 extremities, Sensation intact all 4 extremities, Plantars down going.  4.  Ears and Eyes appear Normal, Conjunctivae clear, PERRLA. Moist Oral Mucosa.  5. Supple Neck, No JVD, No cervical lymphadenopathy appriciated, No Carotid Bruits.  6. Symmetrical Chest wall movement,slight crackles left lung base,  Slight e=>a right lung base  7. RRR, No Gallops, Rubs or Murmurs, No Parasternal Heave.  8. Positive Bowel Sounds, Abdomen Soft, No tenderness, No organomegaly appriciated,No rebound -guarding or rigidity.  9.  No Cyanosis, Normal Skin Turgor, No Skin Rash or Bruise.  10. Good muscle tone,  joints appear normal , no effusions, Normal ROM.  11. No Palpable Lymph Nodes in Neck or Axillae     Data Review:    CBC Recent Labs  Lab 01/06/18 2318 01/06/18 2327  WBC 11.3*  --   HGB 14.0 13.9  HCT 42.0 41.0  PLT 267  --   MCV 86.8  --   MCH 28.9  --   MCHC 33.3  --   RDW 16.1*  --   LYMPHSABS 0.8  --   MONOABS 0.7  --   EOSABS 0.2  --   BASOSABS 0.0  --    ------------------------------------------------------------------------------------------------------------------  Chemistries  Recent Labs  Lab 01/06/18 2318 01/06/18 2327  NA 133* 131*  K 3.7 3.7  CL 98 97*  CO2 23  --   GLUCOSE 115* 114*  BUN 11 8  CREATININE 0.47 0.50  CALCIUM 10.6*  --   AST 18  --   ALT 22  --   ALKPHOS 142*  --   BILITOT 0.9  --    ------------------------------------------------------------------------------------------------------------------ CrCl cannot be calculated (Unknown ideal weight.). ------------------------------------------------------------------------------------------------------------------ No results for input(s): TSH, T4TOTAL, T3FREE, THYROIDAB in the last 72 hours.  Invalid input(s): FREET3  Coagulation profile Recent Labs  Lab 01/06/18 2318  INR 1.02    ------------------------------------------------------------------------------------------------------------------- No results for input(s): DDIMER in the last 72 hours. -------------------------------------------------------------------------------------------------------------------  Cardiac Enzymes No results for input(s): CKMB, TROPONINI, MYOGLOBIN in the last 168 hours.  Invalid input(s): CK ------------------------------------------------------------------------------------------------------------------    Component Value Date/Time   BNP 19.3 01/06/2018 2318     ---------------------------------------------------------------------------------------------------------------  Urinalysis No results found for: COLORURINE, APPEARANCEUR, LABSPEC, PHURINE, GLUCOSEU, HGBUR, BILIRUBINUR, KETONESUR, PROTEINUR, UROBILINOGEN, NITRITE, LEUKOCYTESUR  ----------------------------------------------------------------------------------------------------------------   Imaging Results:    Dg Chest 2 View  Result Date: 01/06/2018 CLINICAL DATA:  Short of breath EXAM: CHEST - 2 VIEW COMPARISON:  CT 11/12/2017, radiograph 10/24/2017 FINDINGS: Right infrahilar consolidation presumably corresponding to CT demonstrated lung mass. Increased atelectasis or infiltrate at the left base. Small pleural effusions. Stable heart size. No pneumothorax. IMPRESSION: 1. Development of small pleural effusion and streaky infiltrate at the left lung base. 2. Right hilar and infrahilar opacity presumably corresponding to nodes and consolidation noted on prior CT. Electronically Signed   By: Donavan Foil M.D.   On: 01/06/2018 23:08   Ct Angio Chest Pe W And/or Wo Contrast  Result Date: 01/07/2018 CLINICAL DATA:  Shortness of breath for 1 week. Back pain and constipation for 2 weeks. Patient is receiving radiation treatment for lung cancer. EXAM: CT ANGIOGRAPHY CHEST WITH CONTRAST TECHNIQUE: Multidetector CT imaging of the  chest was performed using the standard protocol during bolus administration of intravenous contrast. Multiplanar CT image reconstructions and MIPs were obtained to evaluate the vascular anatomy. CONTRAST:  121m ISOVUE-370 IOPAMIDOL (ISOVUE-370) INJECTION 76% COMPARISON:  11/12/2017 FINDINGS: Cardiovascular: There is good opacification of the central and segmental pulmonary arteries. No focal filling defects are demonstrated. No evidence of significant pulmonary embolus. Normal caliber thoracic aorta. No aortic dissection.  Scattered aortic calcifications. Normal heart size. No pericardial effusion. Coronary artery calcifications. Mediastinum/Nodes: Esophagus is decompressed. Small esophageal hiatal hernia. Mild prominence of lymph nodes in the right paratracheal region and right hilum. Largest nodes measure up to about 12 mm in diameter. No change in appearance of lymph nodes since the previous study. Lungs/Pleura: Right posterior hilar mass lesion measuring about 3.1 cm diameter. Size is similar to previous study. Since the previous study, there is interval development of consolidation and volume loss in the right lower lobe. This likely represents postobstructive change with the mass lesion likely causing obstruction of the right lower lobe bronchus. There is also focal consolidation in the left lower lung. This could be due to atelectasis or pneumonia. Emphysematous changes in the lungs. No pneumothorax. No pleural effusions. Upper Abdomen: No acute abnormality. Musculoskeletal: Compression of a midthoracic vertebral body, probably T8, new since previous study. This is likely a metastatic lesion. Lucent lesion, likely metastasis in the left posterior second rib. Review of the MIP images confirms the above findings. IMPRESSION: 1. No evidence of significant pulmonary embolus. 2. Re-identified occasion of a 3 cm right hilar mass, similar to previous study. Borderline mediastinal lymph nodes without change. 3. New  collapse and consolidation of the right lower lobe, likely due to obstruction. 4. New consolidation in the left lower lung likely due to atelectasis or pneumonia. 5. Emphysematous changes in the lungs. 6. Aortic atherosclerosis and coronary artery calcifications. 7. New compression of the T8 vertebra, likely metastatic. Lucent metastatic lesion demonstrated in the left second rib. Aortic Atherosclerosis (ICD10-I70.0) and Emphysema (ICD10-J43.9). Electronically Signed   By: Lucienne Capers M.D.   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 Julien Nordmann know that his patient is in the hospital.   Hypertension Cont lisinopril/hydrochlorothiazide   Hyperlipidemia Cont crestor 26m po qhs  Copd (presumed) Duoneb q4h prn   DVT Prophylaxis Lovenox - SCDs  AM Labs Ordered, also please review Full Orders  Family Communication: Admission, patients condition and plan of care including tests being ordered have been discussed with the patient  who indicate understanding and agree with the plan and Code Status.  Code Status  FULL CODE  Likely DC to  home  Condition GUARDED    Consults called: none  Admission status: inpatient   Time spent in minutes : 60   JJani GravelM.D on 01/07/2018 at 3:21 AM  Between 7am to 7pm - Pager - [phone] . After 7pm go to [email or web address] - password [email or web address] - Office  [phone]

## 2018-01-07 NOTE — Progress Notes (Signed)
Pt requesting tylenol for a headache. Has not an order for tylenol. Dr. Karleen Hampshire made aware. New order given for tylenol.  Jill [name].

## 2018-01-08 ENCOUNTER — Inpatient Hospital Stay (HOSPITAL_COMMUNITY): Payer: Medicare Other

## 2018-01-08 ENCOUNTER — Ambulatory Visit: Payer: Self-pay | Admitting: Radiation Oncology

## 2018-01-08 ENCOUNTER — Inpatient Hospital Stay: Payer: Self-pay

## 2018-01-08 DIAGNOSIS — I5030 Unspecified diastolic (congestive) heart failure: Secondary | ICD-10-CM

## 2018-01-08 LAB — CBC
HEMATOCRIT: 37.8 % (ref 36.0–46.0)
HEMOGLOBIN: 12.1 g/dL (ref 12.0–15.0)
MCH: 28.5 pg (ref 26.0–34.0)
MCHC: 32 g/dL (ref 30.0–36.0)
MCV: 89.2 fL (ref 78.0–100.0)
PLATELETS: 214 10*3/uL (ref 150–400)
RBC: 4.24 MIL/uL (ref 3.87–5.11)
RDW: 16.2 % — ABNORMAL HIGH (ref 11.5–15.5)
WBC: 7.2 10*3/uL (ref 4.0–10.5)

## 2018-01-08 LAB — BASIC METABOLIC PANEL
ANION GAP: 9 (ref 5–15)
BUN: 9 mg/dL (ref 6–20)
CHLORIDE: 100 mmol/L (ref 98–111)
CO2: 25 mmol/L (ref 22–32)
Calcium: 9.4 mg/dL (ref 8.9–10.3)
Creatinine, Ser: 0.39 mg/dL — ABNORMAL LOW (ref 0.44–1.00)
GFR calc Af Amer: >60 mL/min (ref >60)
GLUCOSE: 99 mg/dL (ref 70–99)
POTASSIUM: 3.7 mmol/L (ref 3.5–5.1)
Sodium: 134 mmol/L — ABNORMAL LOW (ref 135–145)

## 2018-01-08 LAB — ECHOCARDIOGRAM COMPLETE
Height: 60.984 in
Weight: 2808 oz

## 2018-01-08 MED ORDER — SODIUM CHLORIDE 0.9% FLUSH
10.0000 mL | Freq: Two times a day (BID) | INTRAVENOUS | Status: DC
  Administered 2018-01-10: 10 mL

## 2018-01-08 MED ORDER — SODIUM CHLORIDE 0.9% FLUSH: 10.0000 mL | INTRAVENOUS | Status: DC | PRN

## 2018-01-08 MED ORDER — HYDROMORPHONE HCL 1 MG/ML IJ SOLN
1.0000 mg | INTRAMUSCULAR | Status: DC | PRN
  Administered 2018-01-08 – 2018-01-09 (×4): 1 mg via INTRAVENOUS
  Filled 2018-01-08 (×6): qty 1

## 2018-01-08 NOTE — Progress Notes (Signed)
MD made aware NSL that IV team had placed this AM not with bent catheter and Site dcd. Pt very difficult peripheral IV stick and new order has been placed for PICC line. MD aware that 2 pm Maxipme is unable to be hung at this time. Pt informed and verbalizes understanding

## 2018-01-08 NOTE — Progress Notes (Signed)
PROGRESS NOTE    Jill [name]  YTK:[id number] DOB: July 27, 1960 DOA: 01/06/2018 PCP: Aletha Halim., PA-C    Brief Narrative:  57 year old lady with metastatic stage 4 lung cancer, hypertension, hyperlipidemia, schizophrenia, tobacco abuse, admitted for worsening SOB.CT chest showed no evidence of PE, new consolidation in the left lower lung due to pneumonia and new compression fracture of the T8 vertebra.    Assessment & Plan:   Principal Problem:   HCAP (healthcare-associated pneumonia) Active Problems:   HYPERTENSION, BENIGN SYSTEMIC   Tachycardia   1. Health care associated pneumonia.  Admitted for IV antibiotics with IV vancomycin and cefepime.  Narrow the antibiotics to cefepime and follow the blood cultures.  Get urine strep and legionella antigen .  [place] oxygen to keep sats greater than 90%.  duonebs as needed.      2. Compression fracture of T8 vertebra.  Further work up with an MRI thoracic spine.  Discussed with Dr Julien Nordmann and he is agreeable to k yphoplasty.  IR consulted and aware.   3. Metastatic lung cancer.  Further management as per her oncologist as outpatient. Dr Julien Nordmann reports that she had missed most of her appointments lately.  Will discuss with the patient.   4. Tachycardia probably from the increased work of breathing from pneumonia and lung cancer. Tele shows sinus tachycardia. Will get echocardiogram for further evaluation.   5. 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  Asymptomatic.   6. Mild leukocytosis.  resolved.   7. COPD:  No wheezing heard , duonebs and dulera added.  No indication for steroids.      DVT prophylaxis: lovenox.  Code Status:  Full code.  Family Communication: brother at bedside.  Disposition Plan: pending further work up and clinical improvement.   Consultants:   IR    Procedures: MRI of the thoracic spine   Antimicrobials: vancomycin and cefepime since admission.    Subjective: Coughing, breathing is the  same, back pain worsening.    Objective: Vitals:   01/07/18 2151 01/08/18 0332 01/08/18 0931 01/08/18 1302  BP: 104/72 116/77 115/74 131/83  Pulse: (!) 116 (!) 106  (!) 122  Resp: 20 20  (!) 24  Temp: 98.1 F (36.7 C) 97.7 F (36.5 C)  98.3 F (36.8 C)  TempSrc: Oral Oral  Oral  SpO2: 92% 92%  92%  Weight:      Height:        Intake/Output Summary (Last 24 hours) at 01/08/2018 1401 Last data filed at 01/08/2018 0955 Gross per 24 hour  Intake 1585.42 ml  Output -  Net 1585.42 ml   Filed Weights   01/07/18 0300 01/07/18 0417  Weight: 79.6 kg (175 lb 8 oz) 79.6 kg (175 lb 8 oz)    Examination:  General exam: Appears calm and comfortable  Respiratory system: Clear to auscultation. Respiratory effort normal. No wheezing or rhonchi.  Cardiovascular system: S1 & S2 heard, RRR. No JVD, murmurs, rubs, gallops or clicks. No pedal edema. Gastrointestinal system: Abdomen is nondistended, soft and nontender. No organomegaly or masses felt. Normal bowel sounds heard. Central nervous system: Alert and oriented. Non focal.  Extremities: Symmetric 5 x 5 power. Skin: No rashes, lesions or ulcers Psychiatry: . Mood & affect appropriate.     Data Reviewed: I have personally reviewed following labs and imaging studies  CBC: Recent Labs  Lab 01/06/18 2318 01/06/18 2327 01/08/18 0410  WBC 11.3*  --  7.2  NEUTROABS 9.5*  --   --   HGB  14.0 13.9 12.1  HCT 42.0 41.0 37.8  MCV 86.8  --  89.2  PLT 267  --  867   Basic Metabolic Panel: Recent Labs  Lab 01/06/18 2318 01/06/18 2327 01/08/18 0410  NA 133* 131* 134*  K 3.7 3.7 3.7  CL 98 97* 100  CO2 23  --  25  GLUCOSE 115* 114* 99  BUN 11 8 9   CREATININE 0.47 0.50 0.39*  CALCIUM 10.6*  --  9.4   GFR: Estimated Creatinine Clearance: 75 mL/min (A) (by C-G formula based on SCr of 0.39 mg/dL (L)). Liver Function Tests: Recent Labs  Lab 01/06/18 2318  AST 18  ALT 22  ALKPHOS 142*  BILITOT 0.9  PROT 7.6  ALBUMIN 4.1   No  results for input(s): LIPASE, AMYLASE in the last 168 hours. No results for input(s): AMMONIA in the last 168 hours. Coagulation Profile: Recent Labs  Lab 01/06/18 2318  INR 1.02   Cardiac Enzymes: No results for input(s): CKTOTAL, CKMB, CKMBINDEX, TROPONINI in the last 168 hours. BNP (last 3 results) No results for input(s): PROBNP in the last 8760 hours. HbA1C: No results for input(s): HGBA1C in the last 72 hours. CBG: No results for input(s): GLUCAP in the last 168 hours. Lipid Profile: No results for input(s): CHOL, HDL, LDLCALC, TRIG, CHOLHDL, LDLDIRECT in the last 72 hours. Thyroid Function Tests: No results for input(s): TSH, T4TOTAL, FREET4, T3FREE, THYROIDAB in the last 72 hours. Anemia Panel: No results for input(s): VITAMINB12, FOLATE, FERRITIN, TIBC, IRON, RETICCTPCT in the last 72 hours. Sepsis Labs: Recent Labs  Lab 01/06/18 2327  LATICACIDVEN 0.87    Recent Results (from the past 240 hour(s))  MRSA PCR Screening     Status: None   Collection Time: 01/07/18 10:15 AM  Result Value Ref Range Status   MRSA by PCR NEGATIVE NEGATIVE Final    Comment:        The GeneXpert MRSA Assay (FDA approved for NASAL specimens only), is one component of a comprehensive MRSA colonization surveillance program. It is not intended to diagnose MRSA infection nor to guide or monitor treatment for MRSA infections. Performed at [facility], [place] [address], [place], [place] [address]          Radiology Studies: Dg Chest 2 View  Result Date: 01/06/2018 CLINICAL DATA:  Short of breath EXAM: CHEST - 2 VIEW COMPARISON:  CT 11/12/2017, radiograph 10/24/2017 FINDINGS: Right infrahilar consolidation presumably corresponding to CT demonstrated lung mass. Increased atelectasis or infiltrate at the left base. Small pleural effusions. Stable heart size. No pneumothorax. IMPRESSION: 1. Development of small pleural effusion and streaky infiltrate at the left lung  base. 2. Right hilar and infrahilar opacity presumably corresponding to nodes and consolidation noted on prior CT. Electronically Signed   By: Donavan Foil M.D.   On: 01/06/2018 23:08   Ct Angio Chest Pe W And/or Wo Contrast  Result Date: 01/07/2018 CLINICAL DATA:  Shortness of breath for 1 week. Back pain and constipation for 2 weeks. Patient is receiving radiation treatment for lung cancer. EXAM: CT ANGIOGRAPHY CHEST WITH CONTRAST TECHNIQUE: Multidetector CT imaging of the chest was performed using the standard protocol during bolus administration of intravenous contrast. Multiplanar CT image reconstructions and MIPs were obtained to evaluate the vascular anatomy. CONTRAST:  130mL ISOVUE-370 IOPAMIDOL (ISOVUE-370) INJECTION 76% COMPARISON:  11/12/2017 FINDINGS: Cardiovascular: There is good opacification of the central and segmental pulmonary arteries. No focal filling defects are demonstrated. No evidence of significant pulmonary embolus.  Normal caliber thoracic aorta. No aortic dissection. Scattered aortic calcifications. Normal heart size. No pericardial effusion. Coronary artery calcifications. Mediastinum/Nodes: Esophagus is decompressed. Small esophageal hiatal hernia. Mild prominence of lymph nodes in the right paratracheal region and right hilum. Largest nodes measure up to about 12 mm in diameter. No change in appearance of lymph nodes since the previous study. Lungs/Pleura: Right posterior hilar mass lesion measuring about 3.1 cm diameter. Size is similar to previous study. Since the previous study, there is interval development of consolidation and volume loss in the right lower lobe. This likely represents postobstructive change with the mass lesion likely causing obstruction of the right lower lobe bronchus. There is also focal consolidation in the left lower lung. This could be due to atelectasis or pneumonia. Emphysematous changes in the lungs. No pneumothorax. No pleural effusions. Upper  Abdomen: No acute abnormality. Musculoskeletal: Compression of a midthoracic vertebral body, probably T8, new since previous study. This is likely a metastatic lesion. Lucent lesion, likely metastasis in the left posterior second rib. Review of the MIP images confirms the above findings. IMPRESSION: 1. No evidence of significant pulmonary embolus. 2. Re-identified occasion of a 3 cm right hilar mass, similar to previous study. Borderline mediastinal lymph nodes without change. 3. New collapse and consolidation of the right lower lobe, likely due to obstruction. 4. New consolidation in the left lower lung likely due to atelectasis or pneumonia. 5. Emphysematous changes in the lungs. 6. Aortic atherosclerosis and coronary artery calcifications. 7. New compression of the T8 vertebra, likely metastatic. Lucent metastatic lesion demonstrated in the left second rib. Aortic Atherosclerosis (ICD10-I70.0) and Emphysema (ICD10-J43.9). Electronically Signed   By: Lucienne Capers M.D.   On: 01/07/2018 01:41   Ct T-spine No Charge  Result Date: 01/07/2018 CLINICAL DATA:  57 y/o  F; T8 vertebral body collapse. EXAM: CT THORACIC SPINE WITHOUT CONTRAST TECHNIQUE: Multidetector CT images of the thoracic were obtained using the standard protocol without intravenous contrast. COMPARISON:  01/07/2018 CT chest. 01/01/2018 lumbar spine MRI. 11/12/2017 CT chest. FINDINGS: Alignment: Normal. Vertebrae: 12 rib-bearing vertebral bodies. T7 compression deformity with up to 60% central loss of vertebral body height. Fractures extend through the superior and. No significant bony retropulsion. No additional fracture of the thoracic spine identified. Paraspinal and other soft tissues: Emphysema and right greater than left lung base consolidations, partially visualized. Disc levels: No significant bony foraminal or canal stenosis. IMPRESSION: 1. T7 compression deformity with up to 60% loss of vertebral body height. No bony retropulsion,  foraminal stenosis, or canal stenosis. This may be metastatic, consider further characterization with thoracic MRI with and without intravenous contrast if clinically indicated. 2. No additional fracture identified. 3. No significant bony foraminal or canal stenosis. Electronically Signed   By: Kristine Garbe M.D.   On: 01/07/2018 19:13   [place] Ekg Site Rite  Result Date: 01/08/2018 If Site Rite image not attached, placement could not be confirmed due to current cardiac rhythm.       Scheduled Meds: . amitriptyline  50 mg Oral QHS  . benzonatate  200 mg Oral BID  . dexamethasone  2 mg Oral Daily  . enoxaparin (LOVENOX) injection  40 mg Subcutaneous Q24H  . haloperidol  5 mg Oral BID  . lisinopril  20 mg Oral Daily  . nicotine  21 mg Transdermal Daily  . polyethylene glycol  17 g Oral BID  . rosuvastatin  20 mg Oral q1800  . senna-docusate  1 tablet Oral BID   Continuous Infusions: .  ceFEPime (MAXIPIME) IV Stopped (01/08/18 0534)     LOS: 1 day    Time spent: 35 minutes.     Hosie Poisson, MD [facility] Pager [phone]   If 7PM-7AM, please contact night-coverage [email or web address] Password [email or web address] 01/08/2018, 2:01 PM

## 2018-01-08 NOTE — Progress Notes (Signed)
Peripherally Inserted Central Catheter/Midline Placement  The IV Nurse has discussed with the patient and/or persons authorized to consent for the patient, the purpose of this procedure and the potential benefits and risks involved with this procedure.  The benefits include less needle sticks, lab draws from the catheter, and the patient may be discharged home with the catheter. Risks include, but not limited to, infection, bleeding, blood clot (thrombus formation), and puncture of an artery; nerve damage and irregular heartbeat and possibility to perform a PICC exchange if needed/ordered by physician.  Alternatives to this procedure were also discussed.  Bard Power PICC patient education guide, fact sheet on infection prevention and patient information card has been provided to patient /or left at bedside.    PICC/Midline Placement Documentation  PICC Single Lumen 01/08/18 PICC Right Cephalic 38 cm 0 cm (Active)  Indication for Insertion or Continuance of Line Poor Vasculature-patient has had multiple peripheral attempts or PIVs lasting less than 24 hours 01/08/2018  4:00 PM  Exposed Catheter (cm) 0 cm 01/08/2018  4:00 PM  Site Assessment Clean;Dry;Intact 01/08/2018  4:00 PM  Line Status Flushed;Blood return noted 01/08/2018  4:00 PM  Dressing Type Transparent 01/08/2018  4:00 PM  Dressing Status Clean;Dry;Intact;Antimicrobial disc in place 01/08/2018  4:00 PM  Dressing Change Due 01/15/18 01/08/2018  4:00 PM       Jule Economy Horton 01/08/2018, 4:51 PM

## 2018-01-08 NOTE — Progress Notes (Signed)
  Echocardiogram 2D Echocardiogram has been performed.  Jill [name] F 01/08/2018, 3:18 PM

## 2018-01-08 NOTE — Progress Notes (Signed)
PT Cancellation Note  Patient Details Name: Jill [name] MRN: [id number] DOB: [birth year]   Cancelled Treatment:    Reason Eval/Treat Not Completed: Patient not medically ready; Pt with pathological compression fx (CT Scan says T7 fx, notes say T8) MRI spine pending --possible VP/KPl; will  Continue efforts to see pt for PT eval.     [facility] 01/08/2018, 12:21 PM

## 2018-01-08 NOTE — Progress Notes (Signed)
Pt called our office to let us know she's admitted. It appears that she has a T7 compression fracture. She's to undergo treatment with SRS to her L5 lesion on Wednesday. We will proceed with MRI spine to further characterize her T7 lesion which may also need to be treated with radiotherapy.      Carola Rhine, PAC

## 2018-01-09 ENCOUNTER — Inpatient Hospital Stay (HOSPITAL_COMMUNITY): Payer: Medicare Other

## 2018-01-09 ENCOUNTER — Ambulatory Visit: Payer: Medicare Other | Admitting: Oncology

## 2018-01-09 ENCOUNTER — Ambulatory Visit: Payer: Medicare Other | Admitting: Radiation Oncology

## 2018-01-09 ENCOUNTER — Ambulatory Visit: Payer: Medicare Other

## 2018-01-09 ENCOUNTER — Other Ambulatory Visit: Payer: Medicare Other

## 2018-01-09 MED ORDER — MENTHOL 3 MG MT LOZG
1.0000 | LOZENGE | OROMUCOSAL | Status: DC | PRN
  Administered 2018-01-10: 3 mg via ORAL
  Filled 2018-01-09: qty 9

## 2018-01-09 MED ORDER — HYDROMORPHONE HCL 1 MG/ML IJ SOLN
2.0000 mg | INTRAMUSCULAR | Status: DC | PRN
  Administered 2018-01-09 – 2018-01-12 (×15): 2 mg via INTRAVENOUS
  Filled 2018-01-09 (×15): qty 2

## 2018-01-09 MED ORDER — AMOXICILLIN-POT CLAVULANATE 875-125 MG PO TABS
1.0000 | ORAL_TABLET | Freq: Two times a day (BID) | ORAL | Status: DC
  Administered 2018-01-09 – 2018-01-13 (×9): 1 via ORAL
  Filled 2018-01-09 (×9): qty 1

## 2018-01-09 MED ORDER — LORAZEPAM 0.5 MG PO TABS
0.5000 mg | ORAL_TABLET | Freq: Once | ORAL | Status: AC | PRN
  Administered 2018-01-10: 0.5 mg via ORAL
  Filled 2018-01-09: qty 1

## 2018-01-09 NOTE — Progress Notes (Signed)
PT Cancellation Note  Patient Details Name: CARRIEANN SPIELBERG MRN: [id number] DOB: Oct 26, 1960   Cancelled Treatment:    Reason Eval/Treat Not Completed: Pain limiting ability to participate Pt requests to wait for PT until after MRI and KP.   Annett Boxwell,KATHrine E 01/09/2018, 10:58 AM Carmelia Bake, PT, DPT 01/09/2018 Pager: [phone]

## 2018-01-09 NOTE — Progress Notes (Addendum)
PROGRESS NOTE    Jill [name]  UVO:[id number] DOB: 11/11/1960 DOA: 01/06/2018 PCP: Aletha Halim., PA-C    Brief Narrative:  57 year old lady with metastatic stage 4 lung cancer, hypertension, hyperlipidemia, schizophrenia, tobacco abuse, admitted for worsening SOB.CT chest showed no evidence of PE, new consolidation in the left lower lung due to pneumonia and new compression fracture of the T8 vertebra.    Assessment & Plan:   Principal Problem:   HCAP (healthcare-associated pneumonia) Active Problems:   HYPERTENSION, BENIGN SYSTEMIC   Tachycardia   1. Health care associated pneumonia/ Post obstructive Pneumonia. / acute respiratory failure with hypoxia : Admitted for IV antibiotics with IV vancomycin and cefepime transitioned to oral augmentin to complete the course.  Blood cultures negative so far.  Pending urine strep and legionella antigen .  [place] oxygen to keep sats greater than 90%.  duonebs as needed. Pt remains afebrile and leukocytosis resolved.      2. Compression fracture of T8 vertebra.  Further work up with an MRI thoracic spine with and without constrast.  Discussed with Dr Julien Nordmann and he is agreeable to k yphoplasty.  IR consulted and aware.  Pain control   3. Metastatic lung cancer.  Further management as per her oncologist as outpatient. Dr Julien Nordmann reports that she had missed most of her appointments lately.    4. Tachycardia probably from the increased work of breathing from pneumonia and lung cancer. Tele shows sinus tachycardia. echocardiogram unremarkable .  5. 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  Asymptomatic.   6. Mild leukocytosis.  resolved.   7. COPD:  No wheezing heard , duonebs and dulera added.  No indication for steroids.      DVT prophylaxis: lovenox.  Code Status:  Full code.  Family Communication: None at bedside.  Disposition Plan: pending further work up and clinical improvement.   Consultants:   IR    Procedures: MRI  of the thoracic spine   Antimicrobials: augmentin.    Subjective: She reports her breathing is better.     Objective: Vitals:   01/09/18 0537 01/09/18 0600 01/09/18 0608 01/09/18 1257  BP: 113/81   110/79  Pulse: 100   (!) 106  Resp: (!) 22   16  Temp: 98.1 F (36.7 C)   97.9 F (36.6 C)  TempSrc: Oral   Oral  SpO2: (!) 84% 90% 91% 90%  Weight:      Height:        Intake/Output Summary (Last 24 hours) at 01/09/2018 1531 Last data filed at 01/09/2018 0900 Gross per 24 hour  Intake 120 ml  Output -  Net 120 ml   Filed Weights   01/07/18 0300 01/07/18 0417  Weight: 79.6 kg (175 lb 8 oz) 79.6 kg (175 lb 8 oz)    Examination:  General exam: Appears calm and comfortable on 3 lit of [place] oxygen.  Respiratory system: Clear to auscultation. Respiratory effort normal. No wheezing or rhonchi.  Cardiovascular system: S1 & S2 heard, RRR. No JVD, Gastrointestinal system: Abdomen is soft NT ND BS+ Central nervous system: Alert and oriented. Non focal.  Extremities: trace edema,no cyanosis.  Skin: No rashes, lesions or ulcers Psychiatry: . Mood & affect appropriate.     Data Reviewed: I have personally reviewed following labs and imaging studies  CBC: Recent Labs  Lab 01/06/18 2318 01/06/18 2327 01/08/18 0410  WBC 11.3*  --  7.2  NEUTROABS 9.5*  --   --   HGB 14.0 13.9 12.1  HCT  42.0 41.0 37.8  MCV 86.8  --  89.2  PLT 267  --  591   Basic Metabolic Panel: Recent Labs  Lab 01/06/18 2318 01/06/18 2327 01/08/18 0410  NA 133* 131* 134*  K 3.7 3.7 3.7  CL 98 97* 100  CO2 23  --  25  GLUCOSE 115* 114* 99  BUN 11 8 9   CREATININE 0.47 0.50 0.39*  CALCIUM 10.6*  --  9.4   GFR: Estimated Creatinine Clearance: 75 mL/min (A) (by C-G formula based on SCr of 0.39 mg/dL (L)). Liver Function Tests: Recent Labs  Lab 01/06/18 2318  AST 18  ALT 22  ALKPHOS 142*  BILITOT 0.9  PROT 7.6  ALBUMIN 4.1   No results for input(s): LIPASE, AMYLASE in the last 168  hours. No results for input(s): AMMONIA in the last 168 hours. Coagulation Profile: Recent Labs  Lab 01/06/18 2318  INR 1.02   Cardiac Enzymes: No results for input(s): CKTOTAL, CKMB, CKMBINDEX, TROPONINI in the last 168 hours. BNP (last 3 results) No results for input(s): PROBNP in the last 8760 hours. HbA1C: No results for input(s): HGBA1C in the last 72 hours. CBG: No results for input(s): GLUCAP in the last 168 hours. Lipid Profile: No results for input(s): CHOL, HDL, LDLCALC, TRIG, CHOLHDL, LDLDIRECT in the last 72 hours. Thyroid Function Tests: No results for input(s): TSH, T4TOTAL, FREET4, T3FREE, THYROIDAB in the last 72 hours. Anemia Panel: No results for input(s): VITAMINB12, FOLATE, FERRITIN, TIBC, IRON, RETICCTPCT in the last 72 hours. Sepsis Labs: Recent Labs  Lab 01/06/18 2327  LATICACIDVEN 0.87    Recent Results (from the past 240 hour(s))  Culture, blood (routine x 2)     Status: None (Preliminary result)   Collection Time: 01/06/18 11:04 PM  Result Value Ref Range Status   Specimen Description   Final    BLOOD LEFT ANTECUBITAL Performed at [facility] [address], [place], [place] [address]    Special Requests   Final    BOTTLES DRAWN AEROBIC AND ANAEROBIC Blood Culture adequate volume Performed at [facility] [address], [place], [place] [address]    Culture   Final    NO GROWTH 2 DAYS Performed at [facility] [address] [place], [place] [address]    Report Status PENDING  Incomplete  Culture, blood (routine x 2)     Status: None (Preliminary result)   Collection Time: 01/07/18  1:23 AM  Result Value Ref Range Status   Specimen Description   Final    BLOOD RIGHT FOREARM Performed at [facility] [address], [place], [place] [address]    Special Requests   Final    BOTTLES DRAWN AEROBIC ONLY Blood Culture results may not be optimal due to an inadequate volume of  blood received in culture bottles Performed at [facility] [address], [place], [place] [address]    Culture   Final    NO GROWTH 2 DAYS Performed at [facility] [address], [place], [place] [address]    Report Status PENDING  Incomplete  MRSA PCR Screening     Status: None   Collection Time: 01/07/18 10:15 AM  Result Value Ref Range Status   MRSA by PCR NEGATIVE NEGATIVE Final    Comment:        The GeneXpert MRSA Assay (FDA approved for NASAL specimens only), is one component of a comprehensive MRSA colonization surveillance  program. It is not intended to diagnose MRSA infection nor to guide or monitor treatment for MRSA infections. Performed at [facility], [place] [address] [place], [place] [address]          Radiology Studies: Ct T-spine No Charge  Result Date: 01/07/2018 CLINICAL DATA:  57 y/o  F; T8 vertebral body collapse. EXAM: CT THORACIC SPINE WITHOUT CONTRAST TECHNIQUE: Multidetector CT images of the thoracic were obtained using the standard protocol without intravenous contrast. COMPARISON:  01/07/2018 CT chest. 01/01/2018 lumbar spine MRI. 11/12/2017 CT chest. FINDINGS: Alignment: Normal. Vertebrae: 12 rib-bearing vertebral bodies. T7 compression deformity with up to 60% central loss of vertebral body height. Fractures extend through the superior and. No significant bony retropulsion. No additional fracture of the thoracic spine identified. Paraspinal and other soft tissues: Emphysema and right greater than left lung base consolidations, partially visualized. Disc levels: No significant bony foraminal or canal stenosis. IMPRESSION: 1. T7 compression deformity with up to 60% loss of vertebral body height. No bony retropulsion, foraminal stenosis, or canal stenosis. This may be metastatic, consider further characterization with thoracic MRI with and without intravenous contrast if clinically indicated. 2. No additional  fracture identified. 3. No significant bony foraminal or canal stenosis. Electronically Signed   By: Kristine Garbe M.D.   On: 01/07/2018 19:13   [place] Ekg Site Rite  Result Date: 01/08/2018 If Site Rite image not attached, placement could not be confirmed due to current cardiac rhythm.       Scheduled Meds: . amitriptyline  50 mg Oral QHS  . amoxicillin-clavulanate  1 tablet Oral Q12H  . benzonatate  200 mg Oral BID  . dexamethasone  2 mg Oral Daily  . enoxaparin (LOVENOX) injection  40 mg Subcutaneous Q24H  . haloperidol  5 mg Oral BID  . lisinopril  20 mg Oral Daily  . nicotine  21 mg Transdermal Daily  . polyethylene glycol  17 g Oral BID  . rosuvastatin  20 mg Oral q1800  . senna-docusate  1 tablet Oral BID  . sodium chloride flush  10-40 mL Intracatheter Q12H   Continuous Infusions:    LOS: 2 days    Time spent: 35 minutes.     Hosie Poisson, MD [facility] Pager [phone]   If 7PM-7AM, please contact night-coverage [email or web address] Password [email or web address] 01/09/2018, 3:31 PM

## 2018-01-09 NOTE — Progress Notes (Signed)
Pt came down to MRI for second attempt. With more pre-meds for pain. Pt is unable to lay flat on mri table for more than 60secs. MRI thoracic scan will take 61mins. Per pt. She wants to be sedated for MRI. Pt states she knows she can not lay on this table for very long. Unable to obtain any images with second attempt.

## 2018-01-09 NOTE — Progress Notes (Signed)
Patient has requested lozenges. PCP was notified.

## 2018-01-09 NOTE — Progress Notes (Signed)
I spoke with the patient this am. Her 24 hr total morphine equivalent is 150 mg, and prior to her admission she was taking 30 mg of this. The patient is more open to trying MRI again and I've increased her dilaudid and spoken with her nurse to consider using this every 4 hours and adding a 1 time dose of Ativan 30 minutes prior to reattempting MRI. If she cannot proceed with this MRI she will have to go to [facility] with general anesthesia which she would like to avoid. This also is only done a few times a week and requires scheduling a head of time, and the next time is Thursday. She understands that if she's unable to lay flat for the MRI this could only delay her radiation and potential options for kyphoplasty.     Carola Rhine, PAC

## 2018-01-10 ENCOUNTER — Ambulatory Visit
Admission: RE | Admit: 2018-01-10 | Discharge: 2018-01-10 | Disposition: A | Discharge status: Home / Self Care | Payer: Medicare Other | Source: Ambulatory Visit | Attending: Radiation Oncology | Admitting: Radiation Oncology

## 2018-01-10 ENCOUNTER — Telehealth: Payer: Self-pay

## 2018-01-10 ENCOUNTER — Encounter: Payer: Self-pay | Admitting: Radiation Oncology

## 2018-01-10 DIAGNOSIS — S22060A Wedge compression fracture of T7-T8 vertebra, initial encounter for closed fracture: Secondary | ICD-10-CM

## 2018-01-10 DIAGNOSIS — C3490 Malignant neoplasm of unspecified part of unspecified bronchus or lung: Secondary | ICD-10-CM

## 2018-01-10 DIAGNOSIS — J189 Pneumonia, unspecified organism: Principal | ICD-10-CM

## 2018-01-10 DIAGNOSIS — Z510 Encounter for antineoplastic radiation therapy: Secondary | ICD-10-CM | POA: Diagnosis not present

## 2018-01-10 DIAGNOSIS — C7951 Secondary malignant neoplasm of bone: Secondary | ICD-10-CM

## 2018-01-10 DIAGNOSIS — J449 Chronic obstructive pulmonary disease, unspecified: Secondary | ICD-10-CM

## 2018-01-10 NOTE — Discharge Instructions (Signed)
Private transport company options (there are many others in area as well): [facility] Address: [address] [place], [place] [address] Phone: [phone]  [facility] Address: [address] [place], [place], [place] [address] Phone: [phone]

## 2018-01-10 NOTE — Progress Notes (Signed)
PT Cancellation Note  Patient Details Name: Jill [name] MRN: [id number] DOB: 1960/12/11   Cancelled Treatment:    Reason Eval/Treat Not Completed: Pain limiting ability to participate. Pt scheduled for MRI and possible kyphoplasty this PM. Per pt, awaiting procedure prior to proceeding with eval due to pain. PT to follow up tomorrow.   Lorriane Shire 01/10/2018, 12:21 PM

## 2018-01-10 NOTE — Progress Notes (Signed)
PROGRESS NOTE [facility]   Jill [name]   TIR:[id number] DOB: 1960-09-15  DOA: 01/06/2018 PCP: Aletha Halim., PA-C   Brief Narrative:  Jill [name] is a 57 year old female with metastatic stage IV lung cancer, hypertension, schizophrenia, tobacco abuse who was admitted for shortness of breath and cough associated with back pain.  Upon ED evaluation CT chest showed no evidence of PE however shows new consolidation in the left lower lobe due to pneumonia, also was noted in the compression fracture of T8 vertebra.  Patient was admitted with working diagnosis of healthcare associated pneumonia and compression fracture.  Subjective: Patient seen and examined, was unable to lay flat for MRI.  Pain was unbearable.  Cough and congestion has improved somewhat.  Still requiring oxygen.   Assessment & Plan: Acute respiratory failure with hypoxia due to pneumonia Treat underlying causes, continue O2 supplementation to keep sats greater than 90%.  Healthcare associated pneumonia/postobstructive pneumonia Patient initially treated with IV vancomycin and cefepime, now transitioned to oral Augmentin, will need complete course for total of 10 days.  Blood culture so far negative, strep pneumo and Legionella was not sent.  He is supportive treatment with nebulizer as needed.  Will add flutter valve.  Compression fracture of T8 vertebra Awaiting MRI, she needs to be sedated for this.  IR was consulted for possible kyphoplasty.  Continue pain control IV Dilaudid.  Radiation oncology will perform radiotherapy to see if help with pain control.  Metastatic lung cancer Patient follows with Dr. Earlie Server, however noncompliant with follow-up lately  COPD Stable, continue nebulizer treatment No need for steroids at this time.  DVT prophylaxis: Lovenox Code Status: Full code Family Communication: None at bedside Disposition Plan: Pending further work-up and clinical  improvement   Consultants:   IR  Procedures:   None  Antimicrobials: Anti-infectives (From admission, onward)   Start     Dose/Rate Route Frequency Ordered Stop   01/09/18 1300  amoxicillin-clavulanate (AUGMENTIN) 875-125 MG per tablet 1 tablet     1 tablet Oral Every 12 hours 01/09/18 1213     01/08/18 0600  vancomycin (VANCOCIN) IVPB 1000 mg/200 mL premix  Status:  Discontinued     1,000 mg 200 mL/hr over 60 Minutes Intravenous Every 24 hours 01/07/18 0623 01/08/18 1026   01/07/18 1400  ceFEPIme (MAXIPIME) 1 g in sodium chloride 0.9 % 100 mL IVPB  Status:  Discontinued     1 g 200 mL/hr over 30 Minutes Intravenous Every 8 hours 01/07/18 0201 01/09/18 1213   01/07/18 0215  ceFEPIme (MAXIPIME) 2 g in sodium chloride 0.9 % 100 mL IVPB     2 g 200 mL/hr over 30 Minutes Intravenous  Once 01/07/18 0203 01/07/18 0259   01/07/18 0200  vancomycin (VANCOCIN) 1,500 mg in sodium chloride 0.9 % 500 mL IVPB     1,500 mg 250 mL/hr over 120 Minutes Intravenous  Once 01/07/18 0147 01/07/18 0500   01/07/18 0200  piperacillin-tazobactam (ZOSYN) IVPB 3.375 g  Status:  Discontinued     3.375 g 100 mL/hr over 30 Minutes Intravenous  Once 01/07/18 0147 01/07/18 0203       Objective: Vitals:   01/09/18 0608 01/09/18 1257 01/09/18 2102 01/10/18 0510  BP:  110/79 125/81 (!) 115/95  Pulse:  (!) 106 (!) 112 (!) 104  Resp:  16 14 16   Temp:  97.9 F (36.6 C) 98.5 F (36.9 C) 98.1 F (36.7 C)  TempSrc:  Oral Oral Oral  SpO2: 91% 90% 90%  90%  Weight:      Height:        Intake/Output Summary (Last 24 hours) at 01/10/2018 1212 Last data filed at 01/10/2018 0600 Gross per 24 hour  Intake 600 ml  Output -  Net 600 ml   Filed Weights   01/07/18 0300 01/07/18 0417  Weight: 79.6 kg (175 lb 8 oz) 79.6 kg (175 lb 8 oz)    Examination:  General exam: Appears calm and comfortable  HEENT: OP moist and clear Respiratory system: Decreased breath sounds bilaterally, coarse sounds in the right  lower lobe.  No wheezing. no use of accessory muscle Cardiovascular system: S1 & S2 heard, RRR. No JVD, murmurs, rubs or gallops Gastrointestinal system: Abdomen is nondistended, soft and nontender.  Central nervous system: Alert and oriented. No focal neurological deficits. Extremities: No pedal edema. Symmetric, strength 5/5   Skin: No rashes, lesions or ulcers Psychiatry: Mood & affect appropriate.    Data Reviewed: I have personally reviewed following labs and imaging studies  CBC: Recent Labs  Lab 01/06/18 2318 01/06/18 2327 01/08/18 0410  WBC 11.3*  --  7.2  NEUTROABS 9.5*  --   --   HGB 14.0 13.9 12.1  HCT 42.0 41.0 37.8  MCV 86.8  --  89.2  PLT 267  --  314   Basic Metabolic Panel: Recent Labs  Lab 01/06/18 2318 01/06/18 2327 01/08/18 0410  NA 133* 131* 134*  K 3.7 3.7 3.7  CL 98 97* 100  CO2 23  --  25  GLUCOSE 115* 114* 99  BUN 11 8 9   CREATININE 0.47 0.50 0.39*  CALCIUM 10.6*  --  9.4   GFR: Estimated Creatinine Clearance: 75 mL/min (A) (by C-G formula based on SCr of 0.39 mg/dL (L)). Liver Function Tests: Recent Labs  Lab 01/06/18 2318  AST 18  ALT 22  ALKPHOS 142*  BILITOT 0.9  PROT 7.6  ALBUMIN 4.1   No results for input(s): LIPASE, AMYLASE in the last 168 hours. No results for input(s): AMMONIA in the last 168 hours. Coagulation Profile: Recent Labs  Lab 01/06/18 2318  INR 1.02   Cardiac Enzymes: No results for input(s): CKTOTAL, CKMB, CKMBINDEX, TROPONINI in the last 168 hours. BNP (last 3 results) No results for input(s): PROBNP in the last 8760 hours. HbA1C: No results for input(s): HGBA1C in the last 72 hours. CBG: No results for input(s): GLUCAP in the last 168 hours. Lipid Profile: No results for input(s): CHOL, HDL, LDLCALC, TRIG, CHOLHDL, LDLDIRECT in the last 72 hours. Thyroid Function Tests: No results for input(s): TSH, T4TOTAL, FREET4, T3FREE, THYROIDAB in the last 72 hours. Anemia Panel: No results for input(s):  VITAMINB12, FOLATE, FERRITIN, TIBC, IRON, RETICCTPCT in the last 72 hours. Sepsis Labs: Recent Labs  Lab 01/06/18 2327  LATICACIDVEN 0.87    Recent Results (from the past 240 hour(s))  Culture, blood (routine x 2)     Status: None (Preliminary result)   Collection Time: 01/06/18 11:04 PM  Result Value Ref Range Status   Specimen Description   Final    BLOOD LEFT ANTECUBITAL Performed at [facility] [address] [place], [place] [address]    Special Requests   Final    BOTTLES DRAWN AEROBIC AND ANAEROBIC Blood Culture adequate volume Performed at [facility] [address] [place], [place] [address]    Culture   Final    NO GROWTH 2 DAYS Performed at [facility]  [address] [place], [place] [address]    Report Status PENDING  Incomplete  Culture, blood (routine x 2)     Status: None (Preliminary result)   Collection Time: 01/07/18  1:23 AM  Result Value Ref Range Status   Specimen Description   Final    BLOOD RIGHT FOREARM Performed at [facility] [address] [place], [place] [address]    Special Requests   Final    BOTTLES DRAWN AEROBIC ONLY Blood Culture results may not be optimal due to an inadequate volume of blood received in culture bottles Performed at [facility] [address], [place], [place] [address]    Culture   Final    NO GROWTH 2 DAYS Performed at [facility] [address], [place], [place] [address]    Report Status PENDING  Incomplete  MRSA PCR Screening     Status: None   Collection Time: 01/07/18 10:15 AM  Result Value Ref Range Status   MRSA by PCR NEGATIVE NEGATIVE Final    Comment:        The GeneXpert MRSA Assay (FDA approved for NASAL specimens only), is one component of a comprehensive MRSA colonization surveillance program. It is not intended to diagnose MRSA infection nor to guide or monitor treatment for MRSA  infections. Performed at [facility], [place] [address], [place], [place] [address]       Radiology Studies: No results found.    Scheduled Meds: . amitriptyline  50 mg Oral QHS  . amoxicillin-clavulanate  1 tablet Oral Q12H  . benzonatate  200 mg Oral BID  . dexamethasone  2 mg Oral Daily  . enoxaparin (LOVENOX) injection  40 mg Subcutaneous Q24H  . haloperidol  5 mg Oral BID  . lisinopril  20 mg Oral Daily  . nicotine  21 mg Transdermal Daily  . polyethylene glycol  17 g Oral BID  . rosuvastatin  20 mg Oral q1800  . senna-docusate  1 tablet Oral BID  . sodium chloride flush  10-40 mL Intracatheter Q12H   Continuous Infusions:   LOS: 3 days   Time spent: Total of 35 minutes spent with pt, greater than 50% of which was spent in discussion of  treatment, counseling and coordination of care  Chipper Oman, MD Pager: Text Page via [email or web address]   If 7PM-7AM, please contact night-coverage [email or web address] 01/10/2018, 12:12 PM   Note - This record has been created using [employer]. Chart creation errors have been sought, but may not always have been located. Such creation errors do not reflect on the standard of medical care.

## 2018-01-10 NOTE — Clinical Social Work Note (Signed)
Clinical Social Work Assessment  Patient Details  Name: Jill [name] MRN: [id number] Date of Birth: [birth year]  Date of referral:  01/10/18               Reason for consult:  Transportation                Permission sought to share information with:  Family Supports Permission granted to share information::  Yes, Verbal Permission Granted  Name::     brothers terry and ted  Agency::     Relationship::     Contact Information:     Housing/Transportation Living arrangements for the past 2 months:  Mobile Home Source of Information:  Patient Patient Interpreter Needed:  None Criminal Activity/Legal Involvement Pertinent to Current Situation/Hospitalization:  No - Comment as needed Significant Relationships:  Siblings, Friend Lives with:  Self Do you feel safe going back to the place where you live?  Yes Need for family participation in patient care:  No (Coment)  Care giving concerns:  Pt admitted from home where she resides alone- brothers live down the street and assist her. Has metastatic lung ca, compression fracture. States she has not been going to outpatient appointments lately. States transportation is not an issue. See below. Currently admitted for pneumonia   Social Worker assessment / plan:  CSW consulted to address transportation issues. Pt states that her brother provides her with transportation to her outpatient medical appointments. States she has considered needing to find alternatives "to give my brother a break." States [facility] does not come as far out of town to her neighborhood, and that [employer] "sometimes has to make me wait an hour or 2 after my appointment before I'm picked up. I know they are busy but that is too long when I am used to my brother being there right away." Pt states she does have some funds to pay for transportation, so CSW encouraged her to consider private ride agencies when desired. Pt states she finds this agreeable "but I think I will  stick with my brother as long as he is okay with it." CSW encouraged her and validated her for accepting family's assistance as well as proactivity of considering alternatives so that appointments are not missed in the future.    Employment status:  Disabled (Comment on whether or not currently receiving Disability)(receives disability) Insurance information:  Medicare PT Recommendations:  Not assessed at this time Information / Referral to community resources:     Patient/Family's Response to care:  Pt very gracious   Patient/Family's Understanding of and Emotional Response to Diagnosis, Current Treatment, and Prognosis:  Pt was oriented x4, appropriate, in good spirits as evidenced by smiling and joking appropriately with CSW. Showed appreciative toward her family and providers. Did not discuss current inpatient treatment therefore did not assess understanding.   Emotional Assessment Appearance:  Appears stated age Attitude/Demeanor/Rapport:  Engaged, Gracious Affect (typically observed):  Accepting, Appropriate Orientation:  Oriented to Self, Oriented to Place, Oriented to  Time, Oriented to Situation Alcohol / Substance use:  Not Applicable Psych involvement (Current and /or in the community):  No (Comment)  Discharge Needs  Concerns to be addressed:  No discharge needs identified Readmission within the last 30 days:  No Current discharge risk:  Lives alone Barriers to Discharge:  Continued Medical Work up   [employer], LCSW 01/10/2018, 1:46 PM  [phone]

## 2018-01-10 NOTE — Care Management Important Message (Signed)
Important Message  Patient Details  Name: Jill [name] MRN: [id number] Date of Birth: 06/15/1961   Medicare Important Message Given:  Yes    [name] 01/10/2018, 1:36 PMImportant Message  Patient Details  Name: Jill [name] MRN: [id number] Date of Birth: [birth year]   Medicare Important Message Given:  Yes    [name] 01/10/2018, 1:36 PM

## 2018-01-10 NOTE — Op Note (Signed)
   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 Kyung Rudd, the radiation oncology physician, and physicist.  I participated closely in the radiation treatment planning afterwards. The patient underwent planning CT scan which was fused to the MRI.  These images were fused on the planning system.  Radiation oncology contoured the gross target volume and subsequently expanded this to yield the Planning Target Volume. I actively participated in the planning process.  I helped to define and review the target contours and also the contours of the spinal cord, and selected nearby organs at risk.  All the dose constraints for critical structures were reviewed and compared to AAPM Task Group 101.  The prescription dose conformity was reviewed.  I approved the plan electronically.    Accordingly, [name] was brought to the TrueBeam stereotactic radiation treatment linac and placed in the custom immobilization device.  The patient was aligned according to the IR fiducial markers with BrainLab Exactrac, then orthogonal x-rays were used in ExacTrac with the 6DOF robotic table and the shifts were made to align the patient.  Then conebeam CT was performed to verify precision.  [name] received stereotactic radiosurgery uneventfully.  The detailed description of the procedure is recorded in the radiation oncology procedure note.  I was present for the duration of the procedure.  DISPOSITION:  Following delivery, the patient will be transported back  to her inpatient nursing unit for further care.  The radiation oncology service will arrange for post-treatment followup with Dr. Lisbeth Renshaw.  Hosie Spangle, MD 01/10/2018 2:33 PM

## 2018-01-10 NOTE — Progress Notes (Signed)
The patient did not have adequate pain relief last night when MRI tried to bring her for imaging, this am, she will try with dilaudid and ativan premedication. We will await these results. She will proceed with her L5 SRS treatment at 2pm this afternoon and will also need good pain relief with dilaudid in lieu of laying flat for this procedure as well.    Carola Rhine, PAC

## 2018-01-10 NOTE — Telephone Encounter (Signed)
Received phone call notifying of pt's admission. Dr. Mickeal Skinner made aware per request.

## 2018-01-10 NOTE — Progress Notes (Signed)
Spoke with Maudie Mercury RN regarding the patients radiation treatment today at 2 pm.  Transporters will come to get the patient about 1:30, informed to premedicate the patient with her ativan and dilaudid prior to treatment.  RN verbalized understanding and will coordinate.  Will continue to follow as necessary.  Gloriajean Dell. Leonie Green, BSN

## 2018-01-11 DIAGNOSIS — I10 Essential (primary) hypertension: Secondary | ICD-10-CM

## 2018-01-11 LAB — CBC WITH DIFFERENTIAL/PLATELET
Basophils Absolute: 0 10*3/uL (ref 0.0–0.1)
Basophils Relative: 0 %
EOS PCT: 5 %
Eosinophils Absolute: 0.3 10*3/uL (ref 0.0–0.7)
HEMATOCRIT: 40.1 % (ref 36.0–46.0)
HEMOGLOBIN: 12.8 g/dL (ref 12.0–15.0)
LYMPHS ABS: 0.9 10*3/uL (ref 0.7–4.0)
LYMPHS PCT: 13 %
MCH: 28.3 pg (ref 26.0–34.0)
MCHC: 31.9 g/dL (ref 30.0–36.0)
MCV: 88.5 fL (ref 78.0–100.0)
Monocytes Absolute: 0.5 10*3/uL (ref 0.1–1.0)
Monocytes Relative: 7 %
NEUTROS ABS: 5.1 10*3/uL (ref 1.7–7.7)
NEUTROS PCT: 75 %
Platelets: 268 10*3/uL (ref 150–400)
RBC: 4.53 MIL/uL (ref 3.87–5.11)
RDW: 15.5 % (ref 11.5–15.5)
WBC: 6.8 10*3/uL (ref 4.0–10.5)

## 2018-01-11 LAB — BASIC METABOLIC PANEL
ANION GAP: 10 (ref 5–15)
BUN: 11 mg/dL (ref 6–20)
CHLORIDE: 97 mmol/L — AB (ref 98–111)
CO2: 30 mmol/L (ref 22–32)
Calcium: 10.4 mg/dL — ABNORMAL HIGH (ref 8.9–10.3)
Creatinine, Ser: 0.4 mg/dL — ABNORMAL LOW (ref 0.44–1.00)
GFR calc Af Amer: >60 mL/min (ref >60)
GFR calc non Af Amer: >60 mL/min (ref >60)
Glucose, Bld: 94 mg/dL (ref 70–99)
POTASSIUM: 4.2 mmol/L (ref 3.5–5.1)
SODIUM: 137 mmol/L (ref 135–145)

## 2018-01-11 NOTE — Evaluation (Signed)
Physical Therapy Evaluation Patient Details Name: Jill [name] MRN: [id number] DOB: 1961/06/17 Today's Date: 01/11/2018   History of Present Illness  Jill [name] is a 57 year old female with metastatic stage IV lung cancer, hypertension, schizophrenia, tobacco abuse who was admitted for shortness of breath and cough associated with back pain.  Upon ED evaluation CT chest showed no evidence of PE however shows new consolidation in the left lower lobe due to pneumonia, also was noted in the compression fracture of T8 vertebra.  Patient was admitted with working diagnosis of healthcare associated pneumonia and compression fracture; MRI cancelled on 4/10 d/t pt eating breakfast, VP/KP pending; pt had Stereotactic Radiosurgery to L5 spinal metastasis 01/10/18  Clinical Impression  Pt admitted with above diagnosis. Pt currently with functional limitations due to the deficits listed below (see PT Problem List).  Pt will benefit from skilled PT to increase their independence and safety with mobility to allow discharge to the venue listed below. Pt amb ~ 22' with RW and min to min/guard today, if continues to progress well, should be able to d/d home with HHPT; w ill ask OT to see as well     Follow Up Recommendations Home health PT;Supervision - Intermittent    Equipment Recommendations  Rolling walker with 5" wheels;3in1 (PT)    Recommendations for Other Services       Precautions / Restrictions Precautions Precautions: Fall;Back Precaution Comments: educated on back precautions for comfort      Mobility  Bed Mobility Overal bed mobility: Needs Assistance Bed Mobility: Rolling;Sidelying to Sit Rolling: Min guard Sidelying to sit: Min assist       General bed mobility comments: cues to log roll, assist with trunk  Transfers Overall transfer level: Needs assistance Equipment used: Rolling walker (2 wheeled) Transfers: Sit to/from Stand Sit to Stand: Min assist;Min guard          General transfer comment: light assist to rise and stabilize  Ambulation/Gait Ambulation/Gait assistance: Min guard;Min assist Gait Distance (Feet): 65 Feet Assistive device: Rolling walker (2 wheeled) Gait Pattern/deviations: Step-through pattern;Decreased stride length     General Gait Details: cues for RW safety; SpO2= 86% on RA, O2 replaced at 3L and incr to >92%; 2/4 DOE  Financial trader Rankin (Stroke Patients Only)       Balance Overall balance assessment: Needs assistance Sitting-balance support: No upper extremity supported;Feet supported Sitting balance-Leahy Scale: Fair       Standing balance-Leahy Scale: Poor Standing balance comment: reliant on UEs                             Pertinent Vitals/Pain Pain Assessment: 0-10 Pain Score: 3  Pain Location: L flank Pain Descriptors / Indicators: Sore;Shooting(intermittent) Pain Intervention(s): Limited activity within patient's tolerance;Monitored during session    [place] expects to be discharged to:: Private residence Living Arrangements: Alone   Type of Home: Mobile home Home Access: Stairs to enter Entrance Stairs-Rails: Right Entrance Stairs-Number of Steps: 4 Home Layout: One level Home Equipment: [place] - single point Additional Comments: brother nearby, checks on pt, can assist with household management if needed    Prior Function Level of Independence: Independent               Hand Dominance        Extremity/Trunk Assessment   Upper Extremity Assessment Upper  Extremity Assessment: Generalized weakness    Lower Extremity Assessment Lower Extremity Assessment: Generalized weakness       Communication   Communication: No difficulties  Cognition Arousal/Alertness: Awake/alert Behavior During Therapy: WFL for tasks assessed/performed Overall Cognitive Status: Within Functional Limits for tasks assessed                                         General Comments      Exercises     Assessment/Plan    PT Assessment Patient needs continued PT services  PT Problem List Decreased strength;Decreased activity tolerance;Decreased balance;Decreased mobility;Decreased knowledge of use of DME;Pain;Cardiopulmonary status limiting activity       PT Treatment Interventions DME instruction;Gait training;Therapeutic exercise;Patient/family education;Therapeutic activities;Functional mobility training    PT Goals (Current goals can be found in the Care Plan section)  Acute Rehab PT Goals Patient Stated Goal: home, feel better PT Goal Formulation: With patient Time For Goal Achievement: 01/18/18 Potential to Achieve Goals: Good    Frequency Min 3X/week   Barriers to discharge        Co-evaluation               AM-PAC PT "6 Clicks" Daily Activity  Outcome Measure Difficulty turning over in bed (including adjusting bedclothes, sheets and blankets)?: Unable Difficulty moving from lying on back to sitting on the side of the bed? : Unable Difficulty sitting down on and standing up from a chair with arms (e.g., wheelchair, bedside commode, etc,.)?: Unable Help needed moving to and from a bed to chair (including a wheelchair)?: A Little Help needed walking in hospital room?: A Little Help needed climbing 3-5 steps with a railing? : A Little 6 Click Score: 12    End of Session Equipment Utilized During Treatment: Gait belt Activity Tolerance: Patient tolerated treatment well Patient left: in bed;with bed alarm set;with call bell/phone within reach        Time: 1431-1450 PT Time Calculation (min) (ACUTE ONLY): 19 min   Charges:   PT Evaluation $PT Eval Low Complexity: 1 Low     PT G CodesKenyon Ana, PT Pager: [phone] 01/11/2018   [facility] 01/11/2018, 3:09 PM

## 2018-01-11 NOTE — Plan of Care (Signed)
Patient had been constipated since Saturday but has now resolved.

## 2018-01-11 NOTE — Progress Notes (Signed)
Called [facility] to see what time patient was to have her MRI under general anesthesia and patient was not on their list to have it done.  Inpatient scheduling was called and arranged for patient to have MRI tomorrow morning, 7/12, at 0800.  [facility] transportation has been arranged for pickup at 0530 with patient intentions of having the patient in short stay at [facility] at 0600.  Patient informed of plan of care.

## 2018-01-11 NOTE — Progress Notes (Signed)
PROGRESS NOTE [facility]   Jill [name]   MPN:[id number] DOB: 09/24/1960  DOA: 01/06/2018 PCP: Jill Halim., PA-C   Brief Narrative:  Jill [name] is a 57 year old female with metastatic stage IV lung cancer, hypertension, schizophrenia, tobacco abuse who was admitted for shortness of breath and cough associated with back pain.  Upon ED evaluation CT chest showed no evidence of PE however shows new consolidation in the left lower lobe due to pneumonia, also was noted in the compression fracture of T8 vertebra.  Patient was admitted with working diagnosis of healthcare associated pneumonia and compression fracture.  Subjective: Patient seen and examined, she continues to c/o of back pain and some difficulty with breathing. No acute events overnight. Was unable to perform MRI as patient had breakfast this AM. This was reschedule for 7/12 AM. Tolerated radiation well   Assessment & Plan: Acute respiratory failure with hypoxia due to pneumonia Treat underlying causes, continue O2 supplementation to keep sats greater than 90%.  Healthcare associated pneumonia/postobstructive pneumonia - Clinically improving  Patient initially treated with IV vancomycin and cefepime, was transitioned to oral Augmentin to complete total of 10 days of abx therapy.  Blood culture so far negative, strep pneumo and Legionella was not sent. Contine supportive treatment with nebulizer as needed and flutter valve.   Compression fracture of T8 vertebra Awaiting MRI still pending, scheduled for tomorrow at [facility] at this has to be under anesthesia. IR was consulted for possible kyphoplasty.  Continue pain control IV Dilaudid.  Patient tolerated radiation well.   Metastatic lung cancer Patient follows with Dr. Earlie [name], however noncompliant with follow-ups lately  COPD Stable, continue nebulizer treatment No need for steroids at this time.  DVT prophylaxis: Lovenox Code Status: Full code Family  Communication: None at bedside Disposition Plan: Pending further work-up and clinical improvement   Consultants:   IR  Procedures:   None  Antimicrobials: Anti-infectives (From admission, onward)   Start     Dose/Rate Route Frequency Ordered Stop   01/09/18 1300  amoxicillin-clavulanate (AUGMENTIN) 875-125 MG per tablet 1 tablet     1 tablet Oral Every 12 hours 01/09/18 1213     01/08/18 0600  vancomycin (VANCOCIN) IVPB 1000 mg/200 mL premix  Status:  Discontinued     1,000 mg 200 mL/hr over 60 Minutes Intravenous Every 24 hours 01/07/18 0623 01/08/18 1026   01/07/18 1400  ceFEPIme (MAXIPIME) 1 g in sodium chloride 0.9 % 100 mL IVPB  Status:  Discontinued     1 g 200 mL/hr over 30 Minutes Intravenous Every 8 hours 01/07/18 0201 01/09/18 1213   01/07/18 0215  ceFEPIme (MAXIPIME) 2 g in sodium chloride 0.9 % 100 mL IVPB     2 g 200 mL/hr over 30 Minutes Intravenous  Once 01/07/18 0203 01/07/18 0259   01/07/18 0200  vancomycin (VANCOCIN) 1,500 mg in sodium chloride 0.9 % 500 mL IVPB     1,500 mg 250 mL/hr over 120 Minutes Intravenous  Once 01/07/18 0147 01/07/18 0500   01/07/18 0200  piperacillin-tazobactam (ZOSYN) IVPB 3.375 g  Status:  Discontinued     3.375 g 100 mL/hr over 30 Minutes Intravenous  Once 01/07/18 0147 01/07/18 0203       Objective: Vitals:   01/10/18 0510 01/10/18 1556 01/10/18 2154 01/11/18 0658  BP: (!) 115/95 130/79 115/74 131/87  Pulse: (!) 104 (!) 104 92 (!) 103  Resp: 16 16 18 20   Temp: 98.1 F (36.7 C) 98 F (36.7 C) (!)  97.4 F (36.3 C) (!) 97.5 F (36.4 C)  TempSrc: Oral Oral Oral Oral  SpO2: 90% 93% 94% 95%  Weight:      Height:       No intake or output data in the 24 hours ending 01/11/18 1233 Filed Weights   01/07/18 0300 01/07/18 0417  Weight: 79.6 kg (175 lb 8 oz) 79.6 kg (175 lb 8 oz)    Examination:  General: Pt is alert, awake, not in acute distress Cardiovascular: RRR, S1/S2 +, no rubs, no gallops Respiratory: Decrease  breath sound bilaterally, course sound RLL. No wheezing  Abdominal: Soft, NT, ND, bowel sounds + Extremities: no edema, no cyanosis  Data Reviewed: I have personally reviewed following labs and imaging studies  CBC: Recent Labs  Lab 01/06/18 2318 01/06/18 2327 01/08/18 0410 01/11/18 0355  WBC 11.3*  --  7.2 6.8  NEUTROABS 9.5*  --   --  5.1  HGB 14.0 13.9 12.1 12.8  HCT 42.0 41.0 37.8 40.1  MCV 86.8  --  89.2 88.5  PLT 267  --  214 423   Basic Metabolic Panel: Recent Labs  Lab 01/06/18 2318 01/06/18 2327 01/08/18 0410 01/11/18 0355  NA 133* 131* 134* 137  K 3.7 3.7 3.7 4.2  CL 98 97* 100 97*  CO2 23  --  25 30  GLUCOSE 115* 114* 99 94  BUN 11 8 9 11   CREATININE 0.47 0.50 0.39* 0.40*  CALCIUM 10.6*  --  9.4 10.4*   GFR: Estimated Creatinine Clearance: 75 mL/min (A) (by C-G formula based on SCr of 0.4 mg/dL (L)). Liver Function Tests: Recent Labs  Lab 01/06/18 2318  AST 18  ALT 22  ALKPHOS 142*  BILITOT 0.9  PROT 7.6  ALBUMIN 4.1   No results for input(s): LIPASE, AMYLASE in the last 168 hours. No results for input(s): AMMONIA in the last 168 hours. Coagulation Profile: Recent Labs  Lab 01/06/18 2318  INR 1.02   Cardiac Enzymes: No results for input(s): CKTOTAL, CKMB, CKMBINDEX, TROPONINI in the last 168 hours. BNP (last 3 results) No results for input(s): PROBNP in the last 8760 hours. HbA1C: No results for input(s): HGBA1C in the last 72 hours. CBG: No results for input(s): GLUCAP in the last 168 hours. Lipid Profile: No results for input(s): CHOL, HDL, LDLCALC, TRIG, CHOLHDL, LDLDIRECT in the last 72 hours. Thyroid Function Tests: No results for input(s): TSH, T4TOTAL, FREET4, T3FREE, THYROIDAB in the last 72 hours. Anemia Panel: No results for input(s): VITAMINB12, FOLATE, FERRITIN, TIBC, IRON, RETICCTPCT in the last 72 hours. Sepsis Labs: Recent Labs  Lab 01/06/18 2327  LATICACIDVEN 0.87    Recent Results (from the past 240 hour(s))    Culture, blood (routine x 2)     Status: None (Preliminary result)   Collection Time: 01/06/18 11:04 PM  Result Value Ref Range Status   Specimen Description   Final    BLOOD LEFT ANTECUBITAL Performed at [facility] [address] [place], [place] [address]    Special Requests   Final    BOTTLES DRAWN AEROBIC AND ANAEROBIC Blood Culture adequate volume Performed at [facility] [address] [place], [place] [address]    Culture   Final    NO GROWTH 4 DAYS Performed at [facility], [place] [address], [place], [place] [address]    Report Status PENDING  Incomplete  Culture, blood (routine x 2)     Status: None (Preliminary result)  Collection Time: 01/07/18  1:23 AM  Result Value Ref Range Status   Specimen Description   Final    BLOOD RIGHT FOREARM Performed at [facility] [address], [place], [place] [address]    Special Requests   Final    BOTTLES DRAWN AEROBIC ONLY Blood Culture results may not be optimal due to an inadequate volume of blood received in culture bottles Performed at [facility] [address], [place], [place] [address]    Culture   Final    NO GROWTH 4 DAYS Performed at [facility], [place] [address] [place], [place] [address]    Report Status PENDING  Incomplete  MRSA PCR Screening     Status: None   Collection Time: 01/07/18 10:15 AM  Result Value Ref Range Status   MRSA by PCR NEGATIVE NEGATIVE Final    Comment:        The GeneXpert MRSA Assay (FDA approved for NASAL specimens only), is one component of a comprehensive MRSA colonization surveillance program. It is not intended to diagnose MRSA infection nor to guide or monitor treatment for MRSA infections. Performed at [facility], [place] [address] [place], [place] [address]      Radiology Studies: No results found.   Scheduled Meds: . amitriptyline  50 mg Oral QHS  .  amoxicillin-clavulanate  1 tablet Oral Q12H  . benzonatate  200 mg Oral BID  . dexamethasone  2 mg Oral Daily  . enoxaparin (LOVENOX) injection  40 mg Subcutaneous Q24H  . haloperidol  5 mg Oral BID  . lisinopril  20 mg Oral Daily  . nicotine  21 mg Transdermal Daily  . polyethylene glycol  17 g Oral BID  . rosuvastatin  20 mg Oral q1800  . senna-docusate  1 tablet Oral BID  . sodium chloride flush  10-40 mL Intracatheter Q12H   Continuous Infusions:   LOS: 4 days   Time spent: Total of 25 minutes spent with pt, greater than 50% of which was spent in discussion of  treatment, counseling and coordination of care  Chipper Oman, MD Pager: Text Page via [email or web address]   If 7PM-7AM, please contact night-coverage [email or web address] 01/11/2018, 12:33 PM   Note - This record has been created using [employer]. Chart creation errors have been sought, but may not always have been located. Such creation errors do not reflect on the standard of medical care.

## 2018-01-12 ENCOUNTER — Ambulatory Visit (HOSPITAL_COMMUNITY)
Admit: 2018-01-12 | Discharge: 2018-01-12 | Disposition: A | Discharge status: Home / Self Care | Payer: Medicare Other | Attending: Family Medicine | Admitting: Family Medicine

## 2018-01-12 ENCOUNTER — Encounter (HOSPITAL_COMMUNITY)
Admission: RE | Disposition: A | Discharge status: Home Health | Payer: Self-pay | Source: Home / Self Care | Attending: Family Medicine

## 2018-01-12 ENCOUNTER — Inpatient Hospital Stay (HOSPITAL_COMMUNITY): Payer: Medicare Other | Admitting: Anesthesiology

## 2018-01-12 ENCOUNTER — Encounter (HOSPITAL_COMMUNITY): Payer: Self-pay | Admitting: *Deleted

## 2018-01-12 HISTORY — PX: RADIOLOGY WITH ANESTHESIA: SHX6223

## 2018-01-12 LAB — CULTURE, BLOOD (ROUTINE X 2)
Culture: NO GROWTH
Culture: NO GROWTH
Special Requests: ADEQUATE

## 2018-01-12 SURGERY — MRI WITH ANESTHESIA
Anesthesia: General

## 2018-01-12 MED ORDER — PROPOFOL 10 MG/ML IV BOLUS
INTRAVENOUS | Status: DC | PRN
  Administered 2018-01-12: 150 mg via INTRAVENOUS

## 2018-01-12 MED ORDER — ROCURONIUM BROMIDE 100 MG/10ML IV SOLN
INTRAVENOUS | Status: DC | PRN
  Administered 2018-01-12: 50 mg via INTRAVENOUS

## 2018-01-12 MED ORDER — MIDAZOLAM HCL 5 MG/5ML IJ SOLN
INTRAMUSCULAR | Status: DC | PRN
  Administered 2018-01-12 (×2): 1 mg via INTRAVENOUS

## 2018-01-12 MED ORDER — FENTANYL CITRATE (PF) 100 MCG/2ML IJ SOLN
INTRAMUSCULAR | Status: DC | PRN
  Administered 2018-01-12 (×2): 50 ug via INTRAVENOUS

## 2018-01-12 MED ORDER — ONDANSETRON HCL 4 MG/2ML IJ SOLN
INTRAMUSCULAR | Status: DC | PRN
  Administered 2018-01-12: 4 mg via INTRAVENOUS

## 2018-01-12 MED ORDER — HYDROMORPHONE HCL 1 MG/ML IJ SOLN
0.2500 mg | INTRAMUSCULAR | Status: DC | PRN
  Administered 2018-01-12 (×2): 0.25 mg via INTRAVENOUS

## 2018-01-12 MED ORDER — HYDROMORPHONE HCL 2 MG PO TABS
2.0000 mg | ORAL_TABLET | ORAL | Status: DC | PRN
  Administered 2018-01-12 (×2): 2 mg via ORAL
  Filled 2018-01-12 (×2): qty 1

## 2018-01-12 MED ORDER — DEXAMETHASONE SODIUM PHOSPHATE 10 MG/ML IJ SOLN
INTRAMUSCULAR | Status: DC | PRN
  Administered 2018-01-12: 10 mg via INTRAVENOUS

## 2018-01-12 MED ORDER — LIDOCAINE HCL (CARDIAC) PF 100 MG/5ML IV SOSY
PREFILLED_SYRINGE | INTRAVENOUS | Status: DC | PRN
  Administered 2018-01-12: 80 mg via INTRAVENOUS

## 2018-01-12 MED ORDER — HYDROMORPHONE HCL 1 MG/ML IJ SOLN
INTRAMUSCULAR | Status: AC
  Filled 2018-01-12: qty 1

## 2018-01-12 MED ORDER — SUGAMMADEX SODIUM 200 MG/2ML IV SOLN
INTRAVENOUS | Status: DC | PRN
  Administered 2018-01-12: 200 mg via INTRAVENOUS

## 2018-01-12 MED ORDER — LACTATED RINGERS IV SOLN
INTRAVENOUS | Status: DC
  Administered 2018-01-12: 07:00:00 via INTRAVENOUS

## 2018-01-12 MED ORDER — OXYCODONE HCL 5 MG/5ML PO SOLN: 5.0000 mg | Freq: Once | ORAL | Status: DC | PRN

## 2018-01-12 MED ORDER — GADOBENATE DIMEGLUMINE 529 MG/ML IV SOLN
20.0000 mL | Freq: Once | INTRAVENOUS | Status: AC
  Administered 2018-01-12: 17 mL via INTRAVENOUS

## 2018-01-12 MED ORDER — PHENYLEPHRINE HCL 10 MG/ML IJ SOLN
INTRAMUSCULAR | Status: DC | PRN
  Administered 2018-01-12 (×2): 120 ug via INTRAVENOUS
  Administered 2018-01-12: 80 ug via INTRAVENOUS
  Administered 2018-01-12: 120 ug via INTRAVENOUS

## 2018-01-12 MED ORDER — OXYCODONE HCL 5 MG PO TABS: 5.0000 mg | ORAL_TABLET | Freq: Once | ORAL | Status: DC | PRN

## 2018-01-12 NOTE — Progress Notes (Signed)
PROGRESS NOTE [facility]   Jill [name]   VEH:[id number] DOB: Oct 19, 1960  DOA: 01/06/2018 PCP: Aletha Halim., PA-C   Brief Narrative:  Jill [name] is a 57 year old female with metastatic stage IV lung cancer, hypertension, schizophrenia, tobacco abuse who was admitted for shortness of breath and cough associated with back pain.  Upon ED evaluation CT chest showed no evidence of PE however shows new consolidation in the left lower lobe due to pneumonia, also was noted in the compression fracture of T8 vertebra.  Patient was admitted with working diagnosis of healthcare associated pneumonia and compression fracture.  Subjective: Patient seen and examined, MRI was done this morning at [facility].  Pain is well controlled.  Breathing much improved.  Assessment & Plan: Acute respiratory failure with hypoxia due to pneumonia Treat underlying causes, continue O2 supplementation to keep sats greater than 90%.  Healthcare associated pneumonia/postobstructive pneumonia - Clinically improving  Patient initially treated with IV vancomycin and cefepime, was transitioned to oral Augmentin to complete total of 10 days of abx therapy.  Blood culture so far negative, strep pneumo and Legionella was not sent. Contine supportive treatment with nebulizer as needed and flutter valve.   Compression fracture of T7 vertebra MRI shows severe T7 vertebral body compression fracture, likely pathologic  From metastasis.  T7-T8 interspinous ligament overlying abnormal muscle enhancement.  Continue pain control.  Case discussed with IR who no if able to perform surgery over the weekend.  Will try to control pain with oral medications and see if we can discharge to follow-up as an outpatient.  Metastatic lung cancer Patient follows with Dr. Earlie Server, however noncompliant with follow-ups lately  COPD Stable, continue nebulizer treatment No need for steroids at this time.  DVT prophylaxis:  Lovenox Code Status: Full code Family Communication: None at bedside Disposition Plan: Pending further work-up and clinical improvement   Consultants:   IR  Procedures:   None  Antimicrobials: Anti-infectives (From admission, onward)   Start     Dose/Rate Route Frequency Ordered Stop   01/09/18 1300  amoxicillin-clavulanate (AUGMENTIN) 875-125 MG per tablet 1 tablet     1 tablet Oral Every 12 hours 01/09/18 1213     01/08/18 0600  vancomycin (VANCOCIN) IVPB 1000 mg/200 mL premix  Status:  Discontinued     1,000 mg 200 mL/hr over 60 Minutes Intravenous Every 24 hours 01/07/18 0623 01/08/18 1026   01/07/18 1400  ceFEPIme (MAXIPIME) 1 g in sodium chloride 0.9 % 100 mL IVPB  Status:  Discontinued     1 g 200 mL/hr over 30 Minutes Intravenous Every 8 hours 01/07/18 0201 01/09/18 1213   01/07/18 0215  ceFEPIme (MAXIPIME) 2 g in sodium chloride 0.9 % 100 mL IVPB     2 g 200 mL/hr over 30 Minutes Intravenous  Once 01/07/18 0203 01/07/18 0259   01/07/18 0200  vancomycin (VANCOCIN) 1,500 mg in sodium chloride 0.9 % 500 mL IVPB     1,500 mg 250 mL/hr over 120 Minutes Intravenous  Once 01/07/18 0147 01/07/18 0500   01/07/18 0200  piperacillin-tazobactam (ZOSYN) IVPB 3.375 g  Status:  Discontinued     3.375 g 100 mL/hr over 30 Minutes Intravenous  Once 01/07/18 0147 01/07/18 0203       Objective: Vitals:   01/12/18 1135 01/12/18 1145 01/12/18 1150 01/12/18 1250  BP: 123/83  119/76 122/79  Pulse: (!) 115  (!) 110 (!) 106  Resp: (!) 27  (!) 23 (!) 21  Temp:  98.1  F (36.7 C)  97.7 F (36.5 C)  TempSrc:    Oral  SpO2: (!) 87% (!) 89% (!) 89% 90%  Weight:      Height:        Intake/Output Summary (Last 24 hours) at 01/12/2018 1427 Last data filed at 01/12/2018 1250 Gross per 24 hour  Intake 820 ml  Output 500 ml  Net 320 ml   Filed Weights   01/07/18 0300 01/07/18 0417 01/12/18 0701  Weight: 79.6 kg (175 lb 8 oz) 79.6 kg (175 lb 8 oz) 79.4 kg (175 lb)     Examination:  General: Pt is alert, awake, not in acute distress, nasal cannula. Cardiovascular: RRR, S1/S2 +, no rubs, no gallops Respiratory: Improved air entry, mild rhonchi at the right lower lobe.  No wheezing or crackles Abdominal: Soft, NT, ND, bowel sounds + Extremities: no edema, no cyanosis  Data Reviewed: I have personally reviewed following labs and imaging studies  CBC: Recent Labs  Lab 01/06/18 2318 01/06/18 2327 01/08/18 0410 01/11/18 0355  WBC 11.3*  --  7.2 6.8  NEUTROABS 9.5*  --   --  5.1  HGB 14.0 13.9 12.1 12.8  HCT 42.0 41.0 37.8 40.1  MCV 86.8  --  89.2 88.5  PLT 267  --  214 315   Basic Metabolic Panel: Recent Labs  Lab 01/06/18 2318 01/06/18 2327 01/08/18 0410 01/11/18 0355  NA 133* 131* 134* 137  K 3.7 3.7 3.7 4.2  CL 98 97* 100 97*  CO2 23  --  25 30  GLUCOSE 115* 114* 99 94  BUN 11 8 9 11   CREATININE 0.47 0.50 0.39* 0.40*  CALCIUM 10.6*  --  9.4 10.4*   GFR: Estimated Creatinine Clearance: 74.9 mL/min (A) (by C-G formula based on SCr of 0.4 mg/dL (L)). Liver Function Tests: Recent Labs  Lab 01/06/18 2318  AST 18  ALT 22  ALKPHOS 142*  BILITOT 0.9  PROT 7.6  ALBUMIN 4.1   No results for input(s): LIPASE, AMYLASE in the last 168 hours. No results for input(s): AMMONIA in the last 168 hours. Coagulation Profile: Recent Labs  Lab 01/06/18 2318  INR 1.02   Cardiac Enzymes: No results for input(s): CKTOTAL, CKMB, CKMBINDEX, TROPONINI in the last 168 hours. BNP (last 3 results) No results for input(s): PROBNP in the last 8760 hours. HbA1C: No results for input(s): HGBA1C in the last 72 hours. CBG: No results for input(s): GLUCAP in the last 168 hours. Lipid Profile: No results for input(s): CHOL, HDL, LDLCALC, TRIG, CHOLHDL, LDLDIRECT in the last 72 hours. Thyroid Function Tests: No results for input(s): TSH, T4TOTAL, FREET4, T3FREE, THYROIDAB in the last 72 hours. Anemia Panel: No results for input(s): VITAMINB12,  FOLATE, FERRITIN, TIBC, IRON, RETICCTPCT in the last 72 hours. Sepsis Labs: Recent Labs  Lab 01/06/18 2327  LATICACIDVEN 0.87    Recent Results (from the past 240 hour(s))  Culture, blood (routine x 2)     Status: None   Collection Time: 01/06/18 11:04 PM  Result Value Ref Range Status   Specimen Description   Final    BLOOD LEFT ANTECUBITAL Performed at [facility] [address] [place], [place] [address]    Special Requests   Final    BOTTLES DRAWN AEROBIC AND ANAEROBIC Blood Culture adequate volume Performed at [facility] [address] [place], [place] [address]    Culture   Final    NO GROWTH 5 DAYS Performed at [facility], [place] [address] [place], [place] [address]    Report Status 01/12/2018 FINAL  Final  Culture, blood (routine x 2)     Status: None   Collection Time: 01/07/18  1:23 AM  Result Value Ref Range Status   Specimen Description   Final    BLOOD RIGHT FOREARM Performed at [facility] [address], [place], [place] [address]    Special Requests   Final    BOTTLES DRAWN AEROBIC ONLY Blood Culture results may not be optimal due to an inadequate volume of blood received in culture bottles Performed at [facility] [address] [place], [place] [address]    Culture   Final    NO GROWTH 5 DAYS Performed at [facility], [place] [address] [place], [place] [address]    Report Status 01/12/2018 FINAL  Final  MRSA PCR Screening     Status: None   Collection Time: 01/07/18 10:15 AM  Result Value Ref Range Status   MRSA by PCR NEGATIVE NEGATIVE Final    Comment:        The GeneXpert MRSA Assay (FDA approved for NASAL specimens only), is one component of a comprehensive MRSA colonization surveillance program. It is not intended to diagnose MRSA infection nor to guide or monitor treatment for MRSA infections. Performed at [facility],  [place] [address] [place],  [address]      Radiology Studies: Mr Thoracic Spine W Wo Contrast  Result Date: 01/12/2018 CLINICAL DATA:  57 year old female with metastatic lung cancer. Status post stereotactic radio surgery to L5 vertebral metastasis 2 days ago. New T8 vertebral body compression fracture on chest CT 01/07/2018. EXAM: MRI THORACIC WITHOUT AND WITH CONTRAST TECHNIQUE: Multiplanar and multiecho pulse sequences of the thoracic spine were obtained without and with intravenous contrast. CONTRAST:  61mL MULTIHANCE GADOBENATE DIMEGLUMINE 529 MG/ML IV SOLN COMPARISON:  Chest CT 01/07/2018. Lumbar MRI 01/01/2018. PET-CT 11/08/2017. FINDINGS: Limited cervical spine imaging: Negative, normal cervical spine marrow signal, vertebral height and alignment. Thoracic spine segmentation: Normal, however the abnormal thoracic vertebra demonstrated recently by CT is T7, not T8. Alignment: Stable since 01/07/2018. Mild focal kyphosis about the abnormal T7 vertebra. No spondylolisthesis. Vertebrae: Severe collapse of the T7 vertebral body with loss of height approaching 66%. On T1 weighted images there is a horizontal fracture line spanning the vertebral body (series 5, image 8). There is little associated marrow edema on STIR imaging, and following contrast the central collapsed portion of the vertebra is largely nonenhancing. There is mild soft tissue enhancement surrounding the T7 vertebra including along both the anterior and posterior longitudinal ligaments. There is nearby abnormal signal and enhancement in the lungs, worse on the right. There is heterogeneous STIR signal and enhancement in the bilateral T7 pedicles and lamina. The T7 spinous process remains normal, but there is abnormal STIR signal in the T7-T8 interspinous ligaments (series 4, image 5). Mild retropulsion of bone effaces the ventral CSF space (series 3, image 7 and series 6, image 23) without spinal stenosis. No definite epidural space  mass. There is mild to moderate left T7 and mild right T7 foraminal stenosis. There is more focal abnormal marrow signal along the anterior inferior T6 vertebral body which appears partially compressed with enhancement which more resembles edema. Subtle patchy marrow edema and enhancement also in the posterior T8 vertebral body which remains intact. The T11 spinous process is abnormal with loss of the normal marrow signal and confluent  enhancement which also extends into the T11-T12 interspinous ligament. No other thoracic spine metastasis identified. Cord: Spinal cord signal and morphology remain normal. No abnormal intradural enhancement. No dural thickening or enhancement. The conus medullaris appears normal at T12-L1. Proximal cauda equina nerve roots appear normal. Paraspinal and other soft tissues: There is confluent abnormal enhancement of the lower trapezius muscle in the midline overlying the midthoracic spine, centered about the T7-T8 level (series 12, image 7). No discrete muscle mass. No intramuscular fluid collection. The other paraspinal soft tissues scratched at the other posterior paraspinal soft tissues are within normal limits. The lungs remain abnormal as seen on 01/07/2018, worse on the right. Nonenhancing proximal right lower lung tumor is suspected on series 13, image 29. Visible upper abdominal viscera are negative aside from distended gallbladder. Disc levels: Capacious thoracic spinal canal. No significant disc degeneration. No spinal stenosis. IMPRESSION: 1. Severe T7 vertebral body compression corresponding to the finding on the 01/07/2018 Chest CT is probably a pathologic compression fracture due to vertebral metastasis. Associated signal abnormality in the T7-T8 interspinous ligament, and overlying abnormal muscle enhancement, are most likely related to biomechanical stress. Likewise, mild compression and abnormal signal at the anterior inferior T6 endplate is favored to be stress  reaction at this time. 2. There is mild retropulsion of the T7 body, but no epidural or extraosseous tumor. Capacious spinal canal without spinal stenosis. There is mild to moderate T7 neural foraminal stenosis. 3. 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 4. No other thoracic spine metastasis.  Normal thoracic spinal cord. 5. Abnormal appearance of the lungs as seen on 01/07/2018. Electronically Signed   By: Genevie Ann M.D.   On: 01/12/2018 10:33     Scheduled Meds: . amitriptyline  50 mg Oral QHS  . amoxicillin-clavulanate  1 tablet Oral Q12H  . benzonatate  200 mg Oral BID  . dexamethasone  2 mg Oral Daily  . enoxaparin (LOVENOX) injection  40 mg Subcutaneous Q24H  . haloperidol  5 mg Oral BID  . lisinopril  20 mg Oral Daily  . nicotine  21 mg Transdermal Daily  . polyethylene glycol  17 g Oral BID  . rosuvastatin  20 mg Oral q1800  . senna-docusate  1 tablet Oral BID  . sodium chloride flush  10-40 mL Intracatheter Q12H   Continuous Infusions: . lactated ringers 10 mL/hr at 01/12/18 0715     LOS: 5 days   Time spent: Total of 25 minutes spent with pt, greater than 50% of which was spent in discussion of  treatment, counseling and coordination of care  Chipper Oman, MD Pager: Text Page via [email or web address]   If 7PM-7AM, please contact night-coverage [email or web address] 01/12/2018, 2:27 PM   Note - This record has been created using [employer]. Chart creation errors have been sought, but may not always have been located. Such creation errors do not reflect on the standard of medical care.

## 2018-01-12 NOTE — Progress Notes (Signed)
OT Cancellation    01/12/18 0600  OT Visit Information  Last OT Received On 01/12/18  Reason Eval/Treat Not Completed Patient at procedure or test/ unavailable (OR)  Maurie Boettcher, OT/L  OT Clinical Specialist [phone]

## 2018-01-12 NOTE — Anesthesia Postprocedure Evaluation (Signed)
Anesthesia Post Note  Patient: Jill [name]  Procedure(s) Performed: MRI WITH ANESTHESIA (N/A )     Patient location during evaluation: PACU Anesthesia Type: General Level of consciousness: awake and alert Pain management: pain level controlled Vital Signs Assessment: post-procedure vital signs reviewed and stable Respiratory status: spontaneous breathing, nonlabored ventilation, respiratory function stable and patient connected to nasal cannula oxygen (Sats noted to be in hi 80s and low 90s. On )O2) Cardiovascular status: blood pressure returned to baseline and stable Postop Assessment: no apparent nausea or vomiting Anesthetic complications: no    Last Vitals:  Vitals:   01/12/18 1135 01/12/18 1145  BP: 123/83   Pulse: (!) 115   Resp: (!) 27   Temp:  36.7 C  SpO2: (!) 87% (!) 89%    Last Pain:  Vitals:   01/12/18 1135  TempSrc:   PainSc: 4                  Ashle Stief,JAMES TERRILL

## 2018-01-12 NOTE — Progress Notes (Signed)
Patient ID: Jill [name], female   DOB: 1960/10/08, 57 y.o.   MRN: [id number] Latest imaging studies have been reviewed by Dr. Laurence Ferrari.  Patient appears to be candidate for T7 kyphoplasty/osteo-cool ablation if still desired.  If patient discharged home over weekend can set up for consultation in IR clinic; if she remains in house will need to be transferred to [facility] and insurance preauthorization initiated.

## 2018-01-12 NOTE — Anesthesia Preprocedure Evaluation (Addendum)
Anesthesia Evaluation  Patient identified by MRN, date of birth, ID band Patient awake    Reviewed: Allergy & Precautions, NPO status , Patient's Chart, lab work & pertinent test results  Airway Mallampati: II  TM Distance: <3 FB Neck ROM: Full    Dental  (+) Teeth Intact, Poor Dentition, Dental Advisory Given   Pulmonary COPD, Current Smoker,  Lung ca and now metastatic   breath sounds clear to auscultation       Cardiovascular hypertension,  Rhythm:Regular Rate:Normal     Neuro/Psych    GI/Hepatic   Endo/Other  Morbid obesity  Renal/GU      Musculoskeletal  (+) Arthritis , Spine mets   Abdominal (+) + obese,   Peds  Hematology   Anesthesia Other Findings   Reproductive/Obstetrics                            Anesthesia Physical Anesthesia Plan  ASA: III  Anesthesia Plan: General   Post-op Pain Management:    Induction: Intravenous  PONV Risk Score and Plan: 3 and Treatment may vary due to age or medical condition and Ondansetron  Airway Management Planned:   Additional Equipment:   Intra-op Plan:   Post-operative Plan:   Informed Consent: I have reviewed the patients History and Physical, chart, labs and discussed the procedure including the risks, benefits and alternatives for the proposed anesthesia with the patient or authorized representative who has indicated his/her understanding and acceptance.   Dental advisory given  Plan Discussed with: CRNA  Anesthesia Plan Comments:         Anesthesia Quick Evaluation

## 2018-01-12 NOTE — Transfer of Care (Signed)
Immediate Anesthesia Transfer of Care Note  Patient: Jill [name]  Procedure(s) Performed: MRI WITH ANESTHESIA (N/A )  Patient Location: PACU  Anesthesia Type:General  Level of Consciousness: awake, alert , oriented and patient cooperative  Airway & Oxygen Therapy: Patient Spontanous Breathing and Patient connected to face mask oxygen  Post-op Assessment: Report given to RN and Post -op Vital signs reviewed and stable  Post vital signs: Reviewed and stable  Last Vitals:  Vitals Value Taken Time  BP 121/80 01/12/2018 10:01 AM  Temp 36.3 C 01/12/2018 10:00 AM  Pulse 126 01/12/2018 10:11 AM  Resp 19 01/12/2018 10:11 AM  SpO2 92 % 01/12/2018 10:11 AM  Vitals shown include unvalidated device data.  Last Pain:  Vitals:   01/12/18 1000  TempSrc:   PainSc: 7       Patients Stated Pain Goal: 3 (31/42/76 7011)  Complications: No apparent anesthesia complications

## 2018-01-12 NOTE — Progress Notes (Signed)
Back from [facility]

## 2018-01-13 DIAGNOSIS — C7951 Secondary malignant neoplasm of bone: Secondary | ICD-10-CM | POA: Insufficient documentation

## 2018-01-13 MED ORDER — AMOXICILLIN-POT CLAVULANATE 875-125 MG PO TABS: 1.0000 | ORAL_TABLET | Freq: Two times a day (BID) | ORAL | 0 refills | Status: AC

## 2018-01-13 MED ORDER — ALBUTEROL SULFATE HFA 108 (90 BASE) MCG/ACT IN AERS: 2.0000 | INHALATION_SPRAY | Freq: Four times a day (QID) | RESPIRATORY_TRACT | 0 refills | Status: DC | PRN

## 2018-01-13 MED ORDER — FENTANYL 25 MCG/HR TD PT72
25.0000 ug | MEDICATED_PATCH | TRANSDERMAL | Status: DC
  Administered 2018-01-13: 25 ug via TRANSDERMAL
  Filled 2018-01-13: qty 1

## 2018-01-13 MED ORDER — FENTANYL 12 MCG/HR TD PT72: 25.0000 ug | MEDICATED_PATCH | TRANSDERMAL | 0 refills | Status: DC

## 2018-01-13 MED ORDER — MENTHOL 3 MG MT LOZG: 1.0000 | LOZENGE | OROMUCOSAL | 0 refills | Status: DC | PRN

## 2018-01-13 NOTE — Progress Notes (Signed)
SATURATION QUALIFICATIONS: (This note is used to comply with regulatory documentation for home oxygen)  Patient Saturations on Room Air at Rest = 86%  Patient Saturations on Room Air while Ambulating = 79%  Patient Saturations on 2 Liters of oxygen while Ambulating = 84%

## 2018-01-13 NOTE — Progress Notes (Signed)
Occupational Therapy Evaluation Patient Details Name: Jill [name] MRN: [id number] DOB: 1960/11/02 Today's Date: 01/13/2018    History of Present Illness Jill [name] is a 57 year old female with metastatic stage IV lung cancer, hypertension, schizophrenia, tobacco abuse who was admitted for shortness of breath and cough associated with back pain.  Upon ED evaluation CT chest showed no evidence of PE however shows new consolidation in the left lower lobe due to pneumonia, also was noted in the compression fracture of T8 vertebra.  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 Recommend a 3 in 1 and a RW for home.     Follow Up Recommendations  No OT follow up    Equipment Recommendations  3 in 1 bedside commode;Other (comment)(Rolling Walker)    Recommendations for Other Services       Precautions / Restrictions Precautions Precautions: Fall;Back Precaution Comments: educated on back precautions for comfort Restrictions Weight Bearing Restrictions: No      Mobility Bed Mobility                  Transfers                      Balance                                           ADL either performed or assessed with clinical judgement   ADL Overall ADL's : Needs assistance/impaired                                       General ADL Comments: Educated patient on energy conservation techniques for ADL. Patient reports independence with BADL, needs assistance with IADL. Recommend RW and 3 in 1 for discharge.     Vision         Perception     Praxis      Pertinent Vitals/Pain Pain Assessment: 0-10 Pain Score: 4  Pain Location: L flank Pain Descriptors / Indicators:  Sore;Shooting Pain Intervention(s): Monitored during session;Limited activity within patient's tolerance     Hand Dominance     Extremity/Trunk Assessment Upper Extremity Assessment Upper Extremity Assessment: Overall WFL for tasks assessed   Lower Extremity Assessment Lower Extremity Assessment: Defer to PT evaluation       Communication Communication Communication: No difficulties   Cognition Arousal/Alertness: Awake/alert Behavior During Therapy: WFL for tasks assessed/performed Overall Cognitive Status: Within Functional Limits for tasks assessed                                     General Comments       Exercises     Shoulder Instructions      Home Living Family/patient expects to be discharged to:: Private residence Living Arrangements: Alone Available Help at Discharge: Family Type of Home: Mobile home Home Access: Stairs to enter Technical brewer of Steps: 4 Entrance Stairs-Rails: Right       Bathroom Shower/Tub: Occupational psychologist: Standard     Home  Equipment: Kasandra Knudsen - single point   Additional Comments: brother nearby, checks on pt, can assist with household management if needed      Prior Functioning/Environment Level of Independence: Independent                 OT Problem List: Cardiopulmonary status limiting activity;Decreased strength;Decreased activity tolerance;Decreased knowledge of use of DME or AE      OT Treatment/Interventions:      OT Goals(Current goals can be found in the care plan section) Acute Rehab OT Goals Patient Stated Goal: home, feel better OT Goal Formulation: All assessment and education complete, DC therapy  OT Frequency:     Barriers to D/C:            Co-evaluation              AM-PAC PT "6 Clicks" Daily Activity     Outcome Measure Help from another person eating meals?: None Help from another person taking care of personal grooming?: None Help from another  person toileting, which includes using toliet, bedpan, or urinal?: A Little Help from another person bathing (including washing, rinsing, drying)?: A Little Help from another person to put on and taking off regular upper body clothing?: None Help from another person to put on and taking off regular lower body clothing?: A Little 6 Click Score: 21   End of Session Nurse Communication: Other (comment)(DME needs)  Activity Tolerance: Patient tolerated treatment well Patient left: in bed;with call bell/phone within reach  OT Visit Diagnosis: Muscle weakness (generalized) (M62.81)                Time: 9833-8250 OT Time Calculation (min): 16 min Charges:  OT General Charges $OT Visit: 1 Visit OT Evaluation $OT Eval Low Complexity: 1 Low G-Codes:      Juanito Gonyer A Landri Dorsainvil 03-Feb-2018, 2:01 PM

## 2018-01-13 NOTE — Progress Notes (Signed)
  Radiation Oncology         [phone] ________________________________  Name: Jill [name] MRN: [id number]  Date: 01/10/2018  DOB: 1961/05/22   SPECIAL TREATMENT PROCEDURE   3D TREATMENT PLANNING AND DOSIMETRY: The patient's radiation plan was reviewed and approved by Dr. Sherwood Gambler from neurosurgery and radiation oncology prior to treatment. It showed 3-dimensional radiation distributions overlaid onto the planning CT/MRI image set. The [facility] for the target structures as well as the organs at risk were reviewed. The documentation of the 3D plan and dosimetry are filed in the radiation oncology EMR.   NARRATIVE: The patient was brought to the TrueBeam stereotactic radiation treatment machine and placed supine on the CT couch. The head frame was applied, and the patient was set up for stereotactic radiosurgery. Neurosurgery was present for the set-up and delivery   SIMULATION VERIFICATION: In the couch zero-angle position, the patient underwent Exactrac imaging using the Brainlab system with orthogonal KV images. These were carefully aligned and repeated to confirm treatment position for each of the isocenters. The Exactrac snap film verification was repeated at each couch angle.   SPECIAL TREATMENT PROCEDURE: The patient received stereotactic radiosurgery to the following target:  PTV1 target was treated using 3 Arcs to a prescription dose of 18 Gy. ExacTrac Snap verification was performed for each couch angle.   STEREOTACTIC TREATMENT MANAGEMENT: Following delivery, the patient was transported to nursing in stable condition and monitored for possible acute effects. Vital signs were recorded . The patient tolerated treatment without significant acute effects, and was discharged to home in stable condition.  PLAN: Follow-up in one month.   ------------------------------------------------  Jill Gross, MD, PhD

## 2018-01-13 NOTE — Care Management Note (Addendum)
Case Management Note  Patient Details  Name: Jill [name] MRN: [id number] Date of Birth: 1961-04-16  Subjective/Objective: 57 yo F with metastatic stage IV lung cancer, hypertension, schizophrenia, tobacco abuse who was admitted for shortness of breath and cough associated with back pain.  CT chest showed new consolidation in the left lower lobe due to pneumonia, also was noted compression fracture of T8 vertebra.      Action/Plan: Received referral to assist with home O2, HH and DME.   Expected Discharge Date:  01/13/18               Expected Discharge Plan:  [place]  In-House Referral:     Discharge planning Services  CM Consult  Post Acute Care Choice:  Home Health, Durable Medical Equipment Choice offered to:  Patient  DME Arranged:  3-N-1, Walker rolling DME Agency:  [facility]:  RN, PT, OT, Nurse's Aide [place] Agency:  [place]  Status of Service:  Completed, signed off  If discussed at [place] of Stay Meetings, dates discussed:    Additional Comments: Met with pt and brother. She plans to return home with the support of her brother. She needs a RW and a 3-in-1 BSC. Discussed preference for a [facility] agency. She chose [facility]. Contacted Jermaine and Seama at [facility] for referral.  Norina Buzzard, RN 01/13/2018, 3:39 PM

## 2018-01-13 NOTE — Discharge Summary (Signed)
Physician Discharge Summary  FLEURETTE WOOLBRIGHT  RKY:[id number]  DOB: January 13, 1961  DOA: 01/06/2018 PCP: Aletha Halim., PA-C  Admit date: 01/06/2018 Discharge date: 01/13/2018  Admitted From: Home  Disposition: Home   Recommendations for Outpatient Follow-up:  1. Follow up with PCP in 1 week  2. Follow up in IR clinic for evaluation of kyphoplasty/osteo-cool ablation  3. Smoking cessation  4. Follow up with oncology   Home Health: PT/OT Aide and Respiratory care  Equipment/Devices: O2 Reid continuous 2L at rest, 3L with ambulation    Discharge Condition: Stable  CODE STATUS: Full Code  Diet recommendation: Heart Healthy   Brief/Interim Summary: For full details see H&P/Progress note, but in brief, Jill [name] is a 57 year old female with metastatic stage IV lung cancer, hypertension, schizophrenia, tobacco abuse who presented to the ED with shortness of breath and cough associated with back pain. Upon ED evaluation CT chest showed no evidence of PE however shows new consolidation in the Right lower lobe, pneumonia/postobstructive and left lower lung consolidation, also was noted in the compression fracture of T8 vertebra.  Patient was admitted with working diagnosis of healthcare associated pneumonia and compression fracture.  Subjective: Patient seen and examined, her pain is much better controled right now. She wants to go home. Her breathing improved some. No acute events overnight. No cough.   Discharge Diagnoses/Hospital Course:  Acute respiratory failure with hypoxia due to pneumonia Treating underlying causes, continue O2 supplementation to keep sats greater than 90%. Pulse with ambulation was obtained and she immediately desat. Will d/c home on oxygen.    Healthcare associated pneumonia/postobstructive pneumonia - Clinically improving  Patient initially treated with IV vancomycin and cefepime, was transitioned to oral Augmentin to complete total of 10 days of abx therapy end  date 01/17/18.  Blood cultures were negative, strep pneumo and Legionella was not sent. Patient was treated with nebulizers and flutter valve. Will send ProAir. Follow up with PCP. Smoking cessation discussed at length. Patient verbalizes understanding.   Compression fracture of T7 vertebra MRI shows severe T7 vertebral body compression fracture, likely pathologic  From metastasis.  T7-T8 interspinous ligament overlying abnormal muscle enhancement. Pain well controlled at this time with fentanyl patch and percocet as needed. Case discussed with IR and recommended outpatient evaluation for possible kyphoplasty/ortho-cool ablation. Patient sent on Fentanyl patch every 72 hrs. Advised to continue home oxycodone and tylenol.  Advised to follow up with PCP for medication refills. [place] registry reviewed.   Metastatic lung cancer Outpatient follow up with Dr Julien Nordmann.   COPD Stable during hospital stay, she was treated with Duoneb as needed while inpatient   On the day of the discharge the patient's vitals were stable, and no other acute medical condition were reported by patient. the patient was felt safe to be discharge to home.   Discharge Instructions  You were cared for by a hospitalist during your hospital stay. If you have any questions about your discharge medications or the care you received while you were in the hospital after you are discharged, you can call the unit and asked to speak with the hospitalist on call if the hospitalist that took care of you is not available. Once you are discharged, your primary care physician will handle any further medical issues. Please note that NO REFILLS for any discharge medications will be authorized once you are discharged, as it is imperative that you return to your primary care physician (or establish a relationship with a primary care physician if  you do not have one) for your aftercare needs so that they can reassess your need for medications and monitor  your lab values.  Discharge Instructions    Call MD for:  difficulty breathing, headache or visual disturbances   Complete by:  As directed    Call MD for:  extreme fatigue   Complete by:  As directed    Call MD for:  hives   Complete by:  As directed    Call MD for:  persistant dizziness or light-headedness   Complete by:  As directed    Call MD for:  persistant nausea and vomiting   Complete by:  As directed    Call MD for:  redness, tenderness, or signs of infection (pain, swelling, redness, odor or green/yellow discharge around incision site)   Complete by:  As directed    Call MD for:  severe uncontrolled pain   Complete by:  As directed    Call MD for:  temperature >100.4   Complete by:  As directed    Diet - low sodium heart healthy   Complete by:  As directed    Discharge instructions   Complete by:  As directed    Follow up with primary care physician as soon as possible  Schedule appointment with Dr Julien Nordmann  Complete antibiotic therapy as indicated  Call IR clinic on 7/15 to schedule an appointment   Increase activity slowly   Complete by:  As directed      Allergies as of 01/13/2018      Reactions   Ibuprofen Nausea And Vomiting   Morphine And Related    Headaches, Vomiting   Prednisone    Dizziness and pain      Medication List    TAKE these medications   acetaminophen 500 MG tablet Commonly known as:  TYLENOL Take 1,000 mg by mouth every 6 (six) hours as needed.   albuterol 108 (90 Base) MCG/ACT inhaler Commonly known as:  PROVENTIL HFA;VENTOLIN HFA Inhale 2 puffs into the lungs every 6 (six) hours as needed for wheezing or shortness of breath.   amitriptyline 50 MG tablet Commonly known as:  ELAVIL Take 1 tablet (50 mg total) by mouth at bedtime.   amoxicillin-clavulanate 875-125 MG tablet Commonly known as:  AUGMENTIN Take 1 tablet by mouth every 12 (twelve) hours for 4 days.   benzonatate 200 MG capsule Commonly known as:  TESSALON Take 1  capsule (200 mg total) by mouth 2 (two) times daily.   dexamethasone 2 MG tablet Commonly known as:  DECADRON Take 1 tablet (2 mg total) by mouth daily.   fentaNYL 12 MCG/HR Commonly known as:  DURAGESIC - dosed mcg/hr Place 2 patches (25 mcg total) onto the skin every 3 (three) days. Start taking on:  01/16/2018   gabapentin 100 MG capsule Commonly known as:  NEURONTIN Take 100-200 mg by mouth daily as needed (pain).   haloperidol 5 MG tablet Commonly known as:  HALDOL Take 5 mg by mouth 2 (two) times daily.   ipratropium-albuterol 0.5-2.5 (3) MG/3ML Soln Commonly known as:  DUONEB Take 3 mLs by nebulization every 4 (four) hours as needed.   lisinopril-hydrochlorothiazide 20-25 MG tablet Commonly known as:  PRINZIDE,ZESTORETIC Take 1 tablet by mouth daily.   menthol-cetylpyridinium 3 MG lozenge Commonly known as:  CEPACOL Take 1 lozenge (3 mg total) by mouth as needed for sore throat.   nicotine 14 mg/24hr patch Commonly known as:  NICODERM CQ - dosed in mg/24 hours  Place 14 mg onto the skin daily.   ondansetron 8 MG tablet Commonly known as:  ZOFRAN Take 1 tablet (8 mg total) by mouth every 8 (eight) hours as needed for nausea or vomiting.   oxyCODONE 5 MG immediate release tablet Commonly known as:  Oxy IR/ROXICODONE Take 1 tablet (5 mg total) by mouth every 6 (six) hours as needed for moderate pain, severe pain or breakthrough pain.   polyethylene glycol packet Commonly known as:  MIRALAX / GLYCOLAX MIX AND DRINK 1 PACKET TWICE DAILY   rosuvastatin 20 MG tablet Commonly known as:  CRESTOR Take 20 mg by mouth daily at 6 PM.   senna-docusate 8.6-50 MG tablet Commonly known as:  Senokot-S Take 1 tablet by mouth 2 (two) times daily.   tiZANidine 4 MG tablet Commonly known as:  ZANAFLEX Take 4 mg by mouth every 6 (six) hours as needed for muscle spasms.            Durable Medical Equipment  (From admission, onward)        Start     Ordered    01/13/18 1309  DME Oxygen  Once    Question Answer Comment  Mode or (Route) Nasal cannula   Liters per Minute 3   Frequency Continuous (stationary and portable oxygen unit needed)   Oxygen delivery system Gas      01/13/18 1309     Follow-up Information    Jacqulynn Cadet, MD. Call on 01/15/2018.   Specialties:  Interventional Radiology, Radiology Why:  Please call to make an appointment for evaluation of Kyphoplasty or Osteo-Cool Contact information: [address] [place] [address] [phone]        Aletha Halim., PA-C. Schedule an appointment as soon as possible for a visit in 1 week(s).   Specialty:  Family Medicine Why:  Hospital follow up  Contact information: [address] [place] [address] [phone]          Allergies  Allergen Reactions  . Ibuprofen Nausea And Vomiting  . Morphine And Related     Headaches, Vomiting  . Prednisone     Dizziness and pain    Consultations:  IR   Procedures/Studies: Dg Chest 2 View  Result Date: 01/06/2018 CLINICAL DATA:  Short of breath EXAM: CHEST - 2 VIEW COMPARISON:  CT 11/12/2017, radiograph 10/24/2017 FINDINGS: Right infrahilar consolidation presumably corresponding to CT demonstrated lung mass. Increased atelectasis or infiltrate at the left base. Small pleural effusions. Stable heart size. No pneumothorax. IMPRESSION: 1. Development of small pleural effusion and streaky infiltrate at the left lung base. 2. Right hilar and infrahilar opacity presumably corresponding to nodes and consolidation noted on prior CT. Electronically Signed   By: Donavan Foil M.D.   On: 01/06/2018 23:08   Ct Angio Chest Pe W And/or Wo Contrast  Result Date: 01/07/2018 CLINICAL DATA:  Shortness of breath for 1 week. Back pain and constipation for 2 weeks. Patient is receiving radiation treatment for lung cancer. EXAM: CT ANGIOGRAPHY CHEST WITH CONTRAST TECHNIQUE: Multidetector CT imaging of the chest was  performed using the standard protocol during bolus administration of intravenous contrast. Multiplanar CT image reconstructions and MIPs were obtained to evaluate the vascular anatomy. CONTRAST:  163mL ISOVUE-370 IOPAMIDOL (ISOVUE-370) INJECTION 76% COMPARISON:  11/12/2017 FINDINGS: Cardiovascular: There is good opacification of the central and segmental pulmonary arteries. No focal filling defects are demonstrated. No evidence of significant pulmonary embolus. Normal caliber thoracic aorta. No aortic dissection. Scattered aortic  calcifications. Normal heart size. No pericardial effusion. Coronary artery calcifications. Mediastinum/Nodes: Esophagus is decompressed. Small esophageal hiatal hernia. Mild prominence of lymph nodes in the right paratracheal region and right hilum. Largest nodes measure up to about 12 mm in diameter. No change in appearance of lymph nodes since the previous study. Lungs/Pleura: Right posterior hilar mass lesion measuring about 3.1 cm diameter. Size is similar to previous study. Since the previous study, there is interval development of consolidation and volume loss in the right lower lobe. This likely represents postobstructive change with the mass lesion likely causing obstruction of the right lower lobe bronchus. There is also focal consolidation in the left lower lung. This could be due to atelectasis or pneumonia. Emphysematous changes in the lungs. No pneumothorax. No pleural effusions. Upper Abdomen: No acute abnormality. Musculoskeletal: Compression of a midthoracic vertebral body, probably T8, new since previous study. This is likely a metastatic lesion. Lucent lesion, likely metastasis in the left posterior second rib. Review of the MIP images confirms the above findings. IMPRESSION: 1. No evidence of significant pulmonary embolus. 2. Re-identified occasion of a 3 cm right hilar mass, similar to previous study. Borderline mediastinal lymph nodes without change. 3. New collapse  and consolidation of the right lower lobe, likely due to obstruction. 4. New consolidation in the left lower lung likely due to atelectasis or pneumonia. 5. Emphysematous changes in the lungs. 6. Aortic atherosclerosis and coronary artery calcifications. 7. New compression of the T8 vertebra, likely metastatic. Lucent metastatic lesion demonstrated in the left second rib. Aortic Atherosclerosis (ICD10-I70.0) and Emphysema (ICD10-J43.9). Electronically Signed   By: Lucienne Capers M.D.   On: 01/07/2018 01:41   Mr Thoracic Spine W Wo Contrast  Result Date: 01/12/2018 CLINICAL DATA:  57 year old female with metastatic lung cancer. Status post stereotactic radio surgery to L5 vertebral metastasis 2 days ago. New T8 vertebral body compression fracture on chest CT 01/07/2018. EXAM: MRI THORACIC WITHOUT AND WITH CONTRAST TECHNIQUE: Multiplanar and multiecho pulse sequences of the thoracic spine were obtained without and with intravenous contrast. CONTRAST:  20mL MULTIHANCE GADOBENATE DIMEGLUMINE 529 MG/ML IV SOLN COMPARISON:  Chest CT 01/07/2018. Lumbar MRI 01/01/2018. PET-CT 11/08/2017. FINDINGS: Limited cervical spine imaging: Negative, normal cervical spine marrow signal, vertebral height and alignment. Thoracic spine segmentation: Normal, however the abnormal thoracic vertebra demonstrated recently by CT is T7, not T8. Alignment: Stable since 01/07/2018. Mild focal kyphosis about the abnormal T7 vertebra. No spondylolisthesis. Vertebrae: Severe collapse of the T7 vertebral body with loss of height approaching 66%. On T1 weighted images there is a horizontal fracture line spanning the vertebral body (series 5, image 8). There is little associated marrow edema on STIR imaging, and following contrast the central collapsed portion of the vertebra is largely nonenhancing. There is mild soft tissue enhancement surrounding the T7 vertebra including along both the anterior and posterior longitudinal ligaments. There is  nearby abnormal signal and enhancement in the lungs, worse on the right. There is heterogeneous STIR signal and enhancement in the bilateral T7 pedicles and lamina. The T7 spinous process remains normal, but there is abnormal STIR signal in the T7-T8 interspinous ligaments (series 4, image 5). Mild retropulsion of bone effaces the ventral CSF space (series 3, image 7 and series 6, image 23) without spinal stenosis. No definite epidural space mass. There is mild to moderate left T7 and mild right T7 foraminal stenosis. There is more focal abnormal marrow signal along the anterior inferior T6 vertebral body which appears partially compressed with enhancement which  more resembles edema. Subtle patchy marrow edema and enhancement also in the posterior T8 vertebral body which remains intact. The T11 spinous process is abnormal with loss of the normal marrow signal and confluent enhancement which also extends into the T11-T12 interspinous ligament. No other thoracic spine metastasis identified. Cord: Spinal cord signal and morphology remain normal. No abnormal intradural enhancement. No dural thickening or enhancement. The conus medullaris appears normal at T12-L1. Proximal cauda equina nerve roots appear normal. Paraspinal and other soft tissues: There is confluent abnormal enhancement of the lower trapezius muscle in the midline overlying the midthoracic spine, centered about the T7-T8 level (series 12, image 7). No discrete muscle mass. No intramuscular fluid collection. The other paraspinal soft tissues scratched at the other posterior paraspinal soft tissues are within normal limits. The lungs remain abnormal as seen on 01/07/2018, worse on the right. Nonenhancing proximal right lower lung tumor is suspected on series 13, image 29. Visible upper abdominal viscera are negative aside from distended gallbladder. Disc levels: Capacious thoracic spinal canal. No significant disc degeneration. No spinal stenosis.  IMPRESSION: 1. Severe T7 vertebral body compression corresponding to the finding on the 01/07/2018 Chest CT is probably a pathologic compression fracture due to vertebral metastasis. Associated signal abnormality in the T7-T8 interspinous ligament, and overlying abnormal muscle enhancement, are most likely related to biomechanical stress. Likewise, mild compression and abnormal signal at the anterior inferior T6 endplate is favored to be stress reaction at this time. 2. There is mild retropulsion of the T7 body, but no epidural or extraosseous tumor. Capacious spinal canal without spinal stenosis. There is mild to moderate T7 neural foraminal stenosis. 3. 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 4. No other thoracic spine metastasis.  Normal thoracic spinal cord. 5. Abnormal appearance of the lungs as seen on 01/07/2018. Electronically Signed   By: Genevie Ann M.D.   On: 01/12/2018 10:33   Mr Lumbar Spine W Wo Contrast  Result Date: 01/01/2018 CLINICAL DATA:  Metastatic lung cancer.  SRS targeting of L5 lesion. EXAM: MRI LUMBAR SPINE WITHOUT AND WITH CONTRAST TECHNIQUE: Multiplanar and multiecho pulse sequences of the lumbar spine were obtained without and with intravenous contrast. CONTRAST:  91mL MULTIHANCE GADOBENATE DIMEGLUMINE 529 MG/ML IV SOLN COMPARISON:  PET-CT 11/08/2017 FINDINGS: Segmentation:  Standard. Alignment:  Normal. Vertebrae: There is extensive marrow edema throughout the L5 vertebral body with an underlying enhancing lesion measuring approximately 2.2 cm corresponding to the hypermetabolic lesion on prior PET-CT. There is a pathologic L5 compression fracture with mild vertebral body height loss and a superimposed superior endplate Schmorl's node deformity. There is disruption of the posterior L5 vertebral body cortex with minimal epidural tumor not resulting in neural impingement. There is marrow edema in the T11 spinous  process and surrounding soft tissues with a nondisplaced fracture evident, not imaged axially. Abnormal uptake was present in this location on the prior PET-CT, and this is indeterminate for a benign versus pathologic fracture as no discrete underlying bone lesion is identified on MRI. There is bilateral L4-5 facet edema. Conus medullaris and cauda equina: Conus extends to the L1 level. Conus and cauda equina appear normal. Paraspinal and other soft tissues: Unremarkable. Disc levels: L1-2 and L2-3: Negative. L3-4: Mild disc bulging and mild facet arthrosis without stenosis. L4-5: Disc desiccation. Mild disc bulging, mild ligamentum flavum thickening, and advanced facet arthrosis result in mild bilateral lateral recess stenosis without significant spinal or neural foraminal stenosis. There  are left larger than right facet joint effusions. L5-S1: Mild facet arthrosis without disc herniation or stenosis. IMPRESSION: 1. L5 vertebral body metastasis with pathologic fracture and mild vertebral body height loss. Trace epidural tumor without stenosis. 2. T11 spinous process fracture. Electronically Signed   By: Logan Bores M.D.   On: 01/01/2018 13:47   Ct T-spine No Charge  Result Date: 01/07/2018 CLINICAL DATA:  57 y/o  F; T8 vertebral body collapse. EXAM: CT THORACIC SPINE WITHOUT CONTRAST TECHNIQUE: Multidetector CT images of the thoracic were obtained using the standard protocol without intravenous contrast. COMPARISON:  01/07/2018 CT chest. 01/01/2018 lumbar spine MRI. 11/12/2017 CT chest. FINDINGS: Alignment: Normal. Vertebrae: 12 rib-bearing vertebral bodies. T7 compression deformity with up to 60% central loss of vertebral body height. Fractures extend through the superior and. No significant bony retropulsion. No additional fracture of the thoracic spine identified. Paraspinal and other soft tissues: Emphysema and right greater than left lung base consolidations, partially visualized. Disc levels: No  significant bony foraminal or canal stenosis. IMPRESSION: 1. T7 compression deformity with up to 60% loss of vertebral body height. No bony retropulsion, foraminal stenosis, or canal stenosis. This may be metastatic, consider further characterization with thoracic MRI with and without intravenous contrast if clinically indicated. 2. No additional fracture identified. 3. No significant bony foraminal or canal stenosis. Electronically Signed   By: Kristine Garbe M.D.   On: 01/07/2018 19:13   [place] Ekg Site Rite  Result Date: 01/08/2018 If Site Rite image not attached, placement could not be confirmed due to current cardiac rhythm.   Discharge Exam: Vitals:   01/12/18 2206 01/13/18 0506  BP: 112/71 104/72  Pulse: 98 82  Resp: 16 18  Temp: 98.4 F (36.9 C) 98.5 F (36.9 C)  SpO2: 90% 90%   Vitals:   01/12/18 1150 01/12/18 1250 01/12/18 2206 01/13/18 0506  BP: 119/76 122/79 112/71 104/72  Pulse: (!) 110 (!) 106 98 82  Resp: (!) 23 (!) 21 16 18   Temp:  97.7 F (36.5 C) 98.4 F (36.9 C) 98.5 F (36.9 C)  TempSrc:  Oral Oral Oral  SpO2: (!) 89% 90% 90% 90%  Weight:      Height:        General: NAD, on Nasal canula  Cardiovascular: RRR, S1/S2 +, no rubs, no gallops Respiratory: Good air entry, very mild rhonchi at the LLL, significantly improved. No wheezing or crackles  Abdominal: Soft, NT, ND, bowel sounds + Extremities: no edema  The results of significant diagnostics from this hospitalization (including imaging, microbiology, ancillary and laboratory) are listed below for reference.     Microbiology: Recent Results (from the past 240 hour(s))  Culture, blood (routine x 2)     Status: None   Collection Time: 01/06/18 11:04 PM  Result Value Ref Range Status   Specimen Description   Final    BLOOD LEFT ANTECUBITAL Performed at [facility] [address], [place], [place] [address]    Special Requests   Final    BOTTLES DRAWN AEROBIC AND ANAEROBIC  Blood Culture adequate volume Performed at [facility] [address], [place], [place] [address]    Culture   Final    NO GROWTH 5 DAYS Performed at [facility], [place] [address] [place], [place] [address]    Report Status 01/12/2018 FINAL  Final  Culture, blood (routine x 2)     Status: None   Collection Time: 01/07/18  1:23 AM  Result  Value Ref Range Status   Specimen Description   Final    BLOOD RIGHT FOREARM Performed at [facility] [address] [place], [place] [address]    Special Requests   Final    BOTTLES DRAWN AEROBIC ONLY Blood Culture results may not be optimal due to an inadequate volume of blood received in culture bottles Performed at [facility] [address], [place], [place] [address]    Culture   Final    NO GROWTH 5 DAYS Performed at [facility], [place] [address], [place], [place] [address]    Report Status 01/12/2018 FINAL  Final  MRSA PCR Screening     Status: None   Collection Time: 01/07/18 10:15 AM  Result Value Ref Range Status   MRSA by PCR NEGATIVE NEGATIVE Final    Comment:        The GeneXpert MRSA Assay (FDA approved for NASAL specimens only), is one component of a comprehensive MRSA colonization surveillance program. It is not intended to diagnose MRSA infection nor to guide or monitor treatment for MRSA infections. Performed at [facility], [place] [address] [place], [place] [address]      Labs: BNP (last 3 results) Recent Labs    01/06/18 2318  BNP 86.7   Basic Metabolic Panel: Recent Labs  Lab 01/06/18 2318 01/06/18 2327 01/08/18 0410 01/11/18 0355  NA 133* 131* 134* 137  K 3.7 3.7 3.7 4.2  CL 98 97* 100 97*  CO2 23  --  25 30  GLUCOSE 115* 114* 99 94  BUN 11 8 9 11   CREATININE 0.47 0.50 0.39* 0.40*  CALCIUM 10.6*  --  9.4 10.4*   Liver Function Tests: Recent Labs  Lab 01/06/18 2318  AST 18  ALT 22  ALKPHOS 142*   BILITOT 0.9  PROT 7.6  ALBUMIN 4.1   No results for input(s): LIPASE, AMYLASE in the last 168 hours. No results for input(s): AMMONIA in the last 168 hours. CBC: Recent Labs  Lab 01/06/18 2318 01/06/18 2327 01/08/18 0410 01/11/18 0355  WBC 11.3*  --  7.2 6.8  NEUTROABS 9.5*  --   --  5.1  HGB 14.0 13.9 12.1 12.8  HCT 42.0 41.0 37.8 40.1  MCV 86.8  --  89.2 88.5  PLT 267  --  214 268   Cardiac Enzymes: No results for input(s): CKTOTAL, CKMB, CKMBINDEX, TROPONINI in the last 168 hours. BNP: Invalid input(s): POCBNP CBG: No results for input(s): GLUCAP in the last 168 hours. D-Dimer No results for input(s): DDIMER in the last 72 hours. Hgb A1c No results for input(s): HGBA1C in the last 72 hours. Lipid Profile No results for input(s): CHOL, HDL, LDLCALC, TRIG, CHOLHDL, LDLDIRECT in the last 72 hours. Thyroid function studies No results for input(s): TSH, T4TOTAL, T3FREE, THYROIDAB in the last 72 hours.  Invalid input(s): FREET3 Anemia work up No results for input(s): VITAMINB12, FOLATE, FERRITIN, TIBC, IRON, RETICCTPCT in the last 72 hours. Urinalysis No results found for: COLORURINE, APPEARANCEUR, [place], [place], [facility], [place], [place], [place], PROTEINUR, UROBILINOGEN, NITRITE, LEUKOCYTESUR Sepsis Labs Invalid input(s): PROCALCITONIN,  WBC,  LACTICIDVEN Microbiology Recent Results (from the past 240 hour(s))  Culture, blood (routine x 2)     Status: None   Collection Time: 01/06/18 11:04 PM  Result Value Ref Range Status   Specimen Description   Final    BLOOD LEFT ANTECUBITAL Performed at [facility] [address], [place], [place] [address]  Special Requests   Final    BOTTLES DRAWN AEROBIC AND ANAEROBIC Blood Culture adequate volume Performed at [facility] [address] [place], [place] [address]    Culture   Final    NO GROWTH 5 DAYS Performed at [facility], [place] [address]  [place], [place] [address]    Report Status 01/12/2018 FINAL  Final  Culture, blood (routine x 2)     Status: None   Collection Time: 01/07/18  1:23 AM  Result Value Ref Range Status   Specimen Description   Final    BLOOD RIGHT FOREARM Performed at [facility] [address] [place], [place] [address]    Special Requests   Final    BOTTLES DRAWN AEROBIC ONLY Blood Culture results may not be optimal due to an inadequate volume of blood received in culture bottles Performed at [facility] [address], [place], [place] [address]    Culture   Final    NO GROWTH 5 DAYS Performed at [facility], [place] [address], [place], [place] [address]    Report Status 01/12/2018 FINAL  Final  MRSA PCR Screening     Status: None   Collection Time: 01/07/18 10:15 AM  Result Value Ref Range Status   MRSA by PCR NEGATIVE NEGATIVE Final    Comment:        The GeneXpert MRSA Assay (FDA approved for NASAL specimens only), is one component of a comprehensive MRSA colonization surveillance program. It is not intended to diagnose MRSA infection nor to guide or monitor treatment for MRSA infections. Performed at [facility], [place] [address], [place], [place] [address]     Time coordinating discharge: 35 minutes  SIGNED:  Chipper Oman, MD  [facility] 01/13/2018, 1:45 PM  Pager please text page via  [email or web address]  Note - This record has been created using [employer]. Chart creation errors have been sought, but may not always have been located. Such creation errors do not reflect on the standard of medical care.

## 2018-01-15 ENCOUNTER — Telehealth: Payer: Self-pay | Admitting: *Deleted

## 2018-01-15 ENCOUNTER — Telehealth: Payer: Self-pay | Admitting: Medical Oncology

## 2018-01-15 ENCOUNTER — Encounter (HOSPITAL_COMMUNITY): Payer: Self-pay | Admitting: Radiology

## 2018-01-15 ENCOUNTER — Other Ambulatory Visit (HOSPITAL_COMMUNITY): Payer: Self-pay | Admitting: Interventional Radiology

## 2018-01-15 DIAGNOSIS — S22000A Wedge compression fracture of unspecified thoracic vertebra, initial encounter for closed fracture: Secondary | ICD-10-CM

## 2018-01-15 NOTE — Telephone Encounter (Signed)
Patient canceled follow up for medication review with Dr Mickeal Skinner for 01/16/2018, states she does not have transportation.  Called patient to rs, lm for returned call to schedule at her convenience with available transportation

## 2018-01-15 NOTE — Telephone Encounter (Signed)
Per Julien Nordmann it is okay to use [facility] PICC line protocol for maintenance dressing change and flushing. Called to [employer] ( [place] office)

## 2018-01-16 ENCOUNTER — Ambulatory Visit: Payer: Medicare Other | Admitting: Internal Medicine

## 2018-01-16 ENCOUNTER — Other Ambulatory Visit: Payer: Self-pay | Admitting: Internal Medicine

## 2018-01-17 ENCOUNTER — Telehealth: Payer: Self-pay

## 2018-01-17 NOTE — Telephone Encounter (Signed)
Received return call from patient to reschedule missed appointment with Dr. Mickeal Skinner. Appointment rescheduled for 7/22 10 am.

## 2018-01-18 ENCOUNTER — Ambulatory Visit: Payer: Medicare Other | Admitting: Nurse Practitioner

## 2018-01-18 ENCOUNTER — Other Ambulatory Visit: Payer: Medicare Other

## 2018-01-18 ENCOUNTER — Ambulatory Visit: Payer: Medicare Other

## 2018-01-22 ENCOUNTER — Telehealth: Payer: Self-pay | Admitting: Medical Oncology

## 2018-01-22 ENCOUNTER — Telehealth: Payer: Self-pay | Admitting: *Deleted

## 2018-01-22 ENCOUNTER — Ambulatory Visit: Payer: Medicare Other | Admitting: Internal Medicine

## 2018-01-22 NOTE — Telephone Encounter (Signed)
Received call from patient stating she has to cancel appt, her back is hurting her so bad.  She has an upcoming appt tomorrow with back surgeon  for possible Kyphoplasty and she will let us know after that appt when she can reschedule.  She also states she is now on oxygen and all of her medications are up to date on refills.

## 2018-01-22 NOTE — Telephone Encounter (Signed)
Patient called asking if we could help.  Her pharmacy [facility] is unable to fill her Fentanyl patch Rx and asked if we could refill it to [facility].  Called lm for her to have her pharmacy that was unable to fill the prescription to the pharmacy that can.  We will not refill any rx of this nature.

## 2018-01-22 NOTE — Telephone Encounter (Signed)
Kyphoplasty -Per Julien Nordmann it is okay for pt to see Dr Vernard Gambles tomorrow because pt wants appt moved . Called to Radiology. Missed med onc appts- Pt stated she is having debilitating back pain and says she wants to get her back problem fixed first. She will call back when she is ready to reschedule.

## 2018-01-23 ENCOUNTER — Encounter: Payer: Self-pay | Admitting: Radiology

## 2018-01-23 ENCOUNTER — Other Ambulatory Visit (HOSPITAL_COMMUNITY): Payer: Self-pay | Admitting: Interventional Radiology

## 2018-01-23 ENCOUNTER — Ambulatory Visit
Admission: RE | Admit: 2018-01-23 | Discharge: 2018-01-23 | Disposition: A | Discharge status: Home / Self Care | Payer: Medicare Other | Source: Ambulatory Visit | Attending: Interventional Radiology | Admitting: Interventional Radiology

## 2018-01-23 ENCOUNTER — Telehealth (HOSPITAL_COMMUNITY): Payer: Self-pay

## 2018-01-23 DIAGNOSIS — S22060A Wedge compression fracture of T7-T8 vertebra, initial encounter for closed fracture: Secondary | ICD-10-CM

## 2018-01-23 DIAGNOSIS — S22000A Wedge compression fracture of unspecified thoracic vertebra, initial encounter for closed fracture: Secondary | ICD-10-CM

## 2018-01-23 HISTORY — PX: IR RADIOLOGIST EVAL & MGMT: IMG5224

## 2018-01-23 NOTE — Consult Note (Signed)
Chief Complaint: Patient was seen in consultation today for  Chief Complaint  Patient presents with  . Consult    Consult for Kyphoplasty/OsteoCool   at the request of Dr Julien Nordmann  Referring Physician(s): [name]  History of Present Illness: Jill [name] is a 57 y.o. female with history of stage IV non-small cell lung adenocarcinoma with osseous metastatic disease.  She has undergone stereotactic radiosurgery of L5 metastasis.  She was admitted earlier this month with severe pain secondary to pathologic T7 compression fracture deformity (numbering scheme's have varied across imaging modalities and this has also been described as T8).  Her pain is poorly controlled.  She takes oxycodone 1-2 every 6 hours which decreases the pain on the visual analog scale from 10 out of 10 to 6 out of 10 transiently.  Any sort of movement exacerbates her symptoms.  She has no lower extremity radiculopathy.  No bowel or bladder control issues.  Past Medical History:  Diagnosis Date  . Cancer ([place])   . Hypertension   . Schizophrenia [facility])     Past Surgical History:  Procedure Laterality Date  . HERNIA REPAIR    . KIDNEY SURGERY    . RADIOLOGY WITH ANESTHESIA N/A 01/12/2018   Procedure: MRI WITH ANESTHESIA;  Surgeon: Radiologist, Medication, MD;  Location: [place];  Service: Radiology;  Laterality: N/A;  . VIDEO BRONCHOSCOPY Bilateral 11/14/2017   Procedure: VIDEO BRONCHOSCOPY WITH FLUORO;  Surgeon: Chesley Mires, MD;  Location: [facility];  Service: Endoscopy;  Laterality: Bilateral;    Allergies: Ibuprofen; Morphine and related; and Prednisone  Medications: Prior to Admission medications   Medication Sig Start Date End Date Taking? Authorizing Provider  acetaminophen (TYLENOL) 500 MG tablet Take 1,000 mg by mouth every 6 (six) hours as needed.   Yes [provider]  amitriptyline (ELAVIL) 50 MG tablet Take 1 tablet (50 mg total) by mouth at bedtime. 11/21/17  Yes Vaslow, Acey Lav,  MD  benzonatate (TESSALON) 200 MG capsule Take 1 capsule (200 mg total) by mouth 2 (two) times daily. 11/18/17  Yes Irene Pap N, DO  gabapentin (NEURONTIN) 100 MG capsule Take 100-200 mg by mouth daily as needed (pain).  12/27/17  Yes [provider]  haloperidol (HALDOL) 5 MG tablet Take 5 mg by mouth 2 (two) times daily.   Yes [provider]  ipratropium-albuterol (DUONEB) 0.5-2.5 (3) MG/3ML SOLN Take 3 mLs by nebulization every 4 (four) hours as needed. 11/18/17  Yes Hall, Lorenda Cahill, DO  lisinopril-hydrochlorothiazide (PRINZIDE,ZESTORETIC) 20-25 MG tablet Take 1 tablet by mouth daily.   Yes [provider]  menthol-cetylpyridinium (CEPACOL) 3 MG lozenge Take 1 lozenge (3 mg total) by mouth as needed for sore throat. 01/13/18  Yes Patrecia Pour, Christean Grief, MD  nicotine (NICODERM CQ - DOSED IN MG/24 HOURS) 14 mg/24hr patch Place 14 mg onto the skin daily.    Yes [provider]  oxyCODONE-acetaminophen (PERCOCET/ROXICET) 5-325 MG tablet Take 1 tablet by mouth every 6 (six) hours as needed for severe pain.   Yes [provider]  polyethylene glycol (MIRALAX / GLYCOLAX) packet [place] 1 PACKET TWICE DAILY 12/08/17  Yes Kyung Rudd, MD  rosuvastatin (CRESTOR) 20 MG tablet Take 20 mg by mouth daily at 6 PM.   Yes [provider]  senna-docusate (SENOKOT-S) 8.6-50 MG tablet Take 1 tablet by mouth 2 (two) times daily. 11/18/17  Yes Hall, Carole N, DO  tiZANidine (ZANAFLEX) 4 MG tablet Take 4 mg by mouth every 6 (  six) hours as needed for muscle spasms.  12/27/17  Yes [provider]  albuterol (PROVENTIL HFA;VENTOLIN HFA) 108 (90 Base) MCG/ACT inhaler Inhale 2 puffs into the lungs every 6 (six) hours as needed for wheezing or shortness of breath. Patient not taking: Reported on 01/23/2018 01/13/18   Patrecia Pour, Christean Grief, MD  dexamethasone (DECADRON) 2 MG tablet Take 1 tablet (2 mg total) by mouth daily. Patient not taking: Reported on 01/23/2018 12/07/17    Ventura Sellers, MD  fentaNYL (DURAGESIC - DOSED MCG/HR) 12 MCG/HR Place 2 patches (25 mcg total) onto the skin every 3 (three) days. Patient not taking: Reported on 01/23/2018 01/16/18   Patrecia Pour, Christean Grief, MD  ondansetron (ZOFRAN) 8 MG tablet Take 1 tablet (8 mg total) by mouth every 8 (eight) hours as needed for nausea or vomiting. Patient not taking: Reported on 01/23/2018 12/06/17   Ventura Sellers, MD     Family History  Problem Relation Age of Onset  . Cancer Mother     Social History   Socioeconomic History  . Marital status: Single    Spouse name: Not on file  . Number of children: Not on file  . Years of education: Not on file  . Highest education level: Not on file  Occupational History  . Not on file  Social Needs  . Financial resource strain: Not on file  . Food insecurity:    Worry: Not on file    Inability: Not on file  . Transportation needs:    Medical: Not on file    Non-medical: Not on file  Tobacco Use  . Smoking status: Current Every Day Smoker    Packs/day: 2.00    Years: 36.00    Pack years: 72.00    Types: Cigarettes    Start date: 10/31/1981  . Smokeless tobacco: Never Used  . Tobacco comment: HAS CALLED QUIT 4 FREE ALREADY  Substance and Sexual Activity  . Alcohol use: Never    Frequency: Never  . Drug use: Never  . Sexual activity: Not on file  Lifestyle  . Physical activity:    Days per week: Not on file    Minutes per session: Not on file  . Stress: Not on file  Relationships  . Social connections:    Talks on phone: Not on file    Gets together: Not on file    Attends religious service: Not on file    Active member of club or organization: Not on file    Attends meetings of clubs or organizations: Not on file    Relationship status: Not on file  Other Topics Concern  . Not on file  Social History Narrative  . Not on file     ECOG Status: 2 - Symptomatic, <50% confined to bed  Review of Systems: A 12 point ROS discussed  and pertinent positives are indicated in the HPI above.  All other systems are negative.    Physical Exam Constitutional: Oriented to person, place, and time. Well-developed and well-nourished. No distress.  HENT:  Head: Normocephalic and atraumatic.  Eyes: Conjunctivae and EOM are normal. Right eye exhibits no discharge. Left eye exhibits no discharge. No scleral icterus.  Neck: No JVD present.  Pulmonary/Chest: Effort normal. No stridor. No respiratory distress.  Abdomen: soft, non distended Neurological:  alert and oriented to person, place, and time.  Skin: Skin is warm and dry.  not diaphoretic.  Psychiatric:   normal mood and affect.   behavior is normal.  Judgment and thought content normal.   Mallampati Score:    Review of Systems  Vital Signs: BP 99/69   Pulse (!) 108   Temp 97.6 F (36.4 C) (Oral)   Resp 16 Comment: O2 at 3L/Nasal Cannula  Ht 5\' 1"  (1.549 m)   Wt 175 lb (79.4 kg)   SpO2 98%   BMI 33.07 kg/m  Imaging: Dg Chest 2 View  Result Date: 01/06/2018 CLINICAL DATA:  Short of breath EXAM: CHEST - 2 VIEW COMPARISON:  CT 11/12/2017, radiograph 10/24/2017 FINDINGS: Right infrahilar consolidation presumably corresponding to CT demonstrated lung mass. Increased atelectasis or infiltrate at the left base. Small pleural effusions. Stable heart size. No pneumothorax. IMPRESSION: 1. Development of small pleural effusion and streaky infiltrate at the left lung base. 2. Right hilar and infrahilar opacity presumably corresponding to nodes and consolidation noted on prior CT. Electronically Signed   By: Donavan Foil M.D.   On: 01/06/2018 23:08   Ct Angio Chest Pe W And/or Wo Contrast  Result Date: 01/07/2018 CLINICAL DATA:  Shortness of breath for 1 week. Back pain and constipation for 2 weeks. Patient is receiving radiation treatment for lung cancer. EXAM: CT ANGIOGRAPHY CHEST WITH CONTRAST TECHNIQUE: Multidetector CT imaging of the chest was performed using the standard  protocol during bolus administration of intravenous contrast. Multiplanar CT image reconstructions and MIPs were obtained to evaluate the vascular anatomy. CONTRAST:  110mL ISOVUE-370 IOPAMIDOL (ISOVUE-370) INJECTION 76% COMPARISON:  11/12/2017 FINDINGS: Cardiovascular: There is good opacification of the central and segmental pulmonary arteries. No focal filling defects are demonstrated. No evidence of significant pulmonary embolus. Normal caliber thoracic aorta. No aortic dissection. Scattered aortic calcifications. Normal heart size. No pericardial effusion. Coronary artery calcifications. Mediastinum/Nodes: Esophagus is decompressed. Small esophageal hiatal hernia. Mild prominence of lymph nodes in the right paratracheal region and right hilum. Largest nodes measure up to about 12 mm in diameter. No change in appearance of lymph nodes since the previous study. Lungs/Pleura: Right posterior hilar mass lesion measuring about 3.1 cm diameter. Size is similar to previous study. Since the previous study, there is interval development of consolidation and volume loss in the right lower lobe. This likely represents postobstructive change with the mass lesion likely causing obstruction of the right lower lobe bronchus. There is also focal consolidation in the left lower lung. This could be due to atelectasis or pneumonia. Emphysematous changes in the lungs. No pneumothorax. No pleural effusions. Upper Abdomen: No acute abnormality. Musculoskeletal: Compression of a midthoracic vertebral body, probably T8, new since previous study. This is likely a metastatic lesion. Lucent lesion, likely metastasis in the left posterior second rib. Review of the MIP images confirms the above findings. IMPRESSION: 1. No evidence of significant pulmonary embolus. 2. Re-identified occasion of a 3 cm right hilar mass, similar to previous study. Borderline mediastinal lymph nodes without change. 3. New collapse and consolidation of the right  lower lobe, likely due to obstruction. 4. New consolidation in the left lower lung likely due to atelectasis or pneumonia. 5. Emphysematous changes in the lungs. 6. Aortic atherosclerosis and coronary artery calcifications. 7. New compression of the T8 vertebra, likely metastatic. Lucent metastatic lesion demonstrated in the left second rib. Aortic Atherosclerosis (ICD10-I70.0) and Emphysema (ICD10-J43.9). Electronically Signed   By: Lucienne Capers M.D.   On: 01/07/2018 01:41   Mr Thoracic Spine W Wo Contrast  Result Date: 01/12/2018 CLINICAL DATA:  57 year old female with metastatic lung cancer. Status post stereotactic radio surgery to L5 vertebral metastasis 2  days ago. New T8 vertebral body compression fracture on chest CT 01/07/2018. EXAM: MRI THORACIC WITHOUT AND WITH CONTRAST TECHNIQUE: Multiplanar and multiecho pulse sequences of the thoracic spine were obtained without and with intravenous contrast. CONTRAST:  58mL MULTIHANCE GADOBENATE DIMEGLUMINE 529 MG/ML IV SOLN COMPARISON:  Chest CT 01/07/2018. Lumbar MRI 01/01/2018. PET-CT 11/08/2017. FINDINGS: Limited cervical spine imaging: Negative, normal cervical spine marrow signal, vertebral height and alignment. Thoracic spine segmentation: Normal, however the abnormal thoracic vertebra demonstrated recently by CT is T7, not T8. Alignment: Stable since 01/07/2018. Mild focal kyphosis about the abnormal T7 vertebra. No spondylolisthesis. Vertebrae: Severe collapse of the T7 vertebral body with loss of height approaching 66%. On T1 weighted images there is a horizontal fracture line spanning the vertebral body (series 5, image 8). There is little associated marrow edema on STIR imaging, and following contrast the central collapsed portion of the vertebra is largely nonenhancing. There is mild soft tissue enhancement surrounding the T7 vertebra including along both the anterior and posterior longitudinal ligaments. There is nearby abnormal signal and  enhancement in the lungs, worse on the right. There is heterogeneous STIR signal and enhancement in the bilateral T7 pedicles and lamina. The T7 spinous process remains normal, but there is abnormal STIR signal in the T7-T8 interspinous ligaments (series 4, image 5). Mild retropulsion of bone effaces the ventral CSF space (series 3, image 7 and series 6, image 23) without spinal stenosis. No definite epidural space mass. There is mild to moderate left T7 and mild right T7 foraminal stenosis. There is more focal abnormal marrow signal along the anterior inferior T6 vertebral body which appears partially compressed with enhancement which more resembles edema. Subtle patchy marrow edema and enhancement also in the posterior T8 vertebral body which remains intact. The T11 spinous process is abnormal with loss of the normal marrow signal and confluent enhancement which also extends into the T11-T12 interspinous ligament. No other thoracic spine metastasis identified. Cord: Spinal cord signal and morphology remain normal. No abnormal intradural enhancement. No dural thickening or enhancement. The conus medullaris appears normal at T12-L1. Proximal cauda equina nerve roots appear normal. Paraspinal and other soft tissues: There is confluent abnormal enhancement of the lower trapezius muscle in the midline overlying the midthoracic spine, centered about the T7-T8 level (series 12, image 7). No discrete muscle mass. No intramuscular fluid collection. The other paraspinal soft tissues scratched at the other posterior paraspinal soft tissues are within normal limits. The lungs remain abnormal as seen on 01/07/2018, worse on the right. Nonenhancing proximal right lower lung tumor is suspected on series 13, image 29. Visible upper abdominal viscera are negative aside from distended gallbladder. Disc levels: Capacious thoracic spinal canal. No significant disc degeneration. No spinal stenosis. IMPRESSION: 1. Severe T7 vertebral  body compression corresponding to the finding on the 01/07/2018 Chest CT is probably a pathologic compression fracture due to vertebral metastasis. Associated signal abnormality in the T7-T8 interspinous ligament, and overlying abnormal muscle enhancement, are most likely related to biomechanical stress. Likewise, mild compression and abnormal signal at the anterior inferior T6 endplate is favored to be stress reaction at this time. 2. There is mild retropulsion of the T7 body, but no epidural or extraosseous tumor. Capacious spinal canal without spinal stenosis. There is mild to moderate T7 neural foraminal stenosis. 3. 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 4. No other thoracic spine metastasis.  Normal thoracic spinal cord. 5. Abnormal appearance of  the lungs as seen on 01/07/2018. Electronically Signed   By: Genevie Ann M.D.   On: 01/12/2018 10:33   Mr Lumbar Spine W Wo Contrast  Result Date: 01/01/2018 CLINICAL DATA:  Metastatic lung cancer.  SRS targeting of L5 lesion. EXAM: MRI LUMBAR SPINE WITHOUT AND WITH CONTRAST TECHNIQUE: Multiplanar and multiecho pulse sequences of the lumbar spine were obtained without and with intravenous contrast. CONTRAST:  69mL MULTIHANCE GADOBENATE DIMEGLUMINE 529 MG/ML IV SOLN COMPARISON:  PET-CT 11/08/2017 FINDINGS: Segmentation:  Standard. Alignment:  Normal. Vertebrae: There is extensive marrow edema throughout the L5 vertebral body with an underlying enhancing lesion measuring approximately 2.2 cm corresponding to the hypermetabolic lesion on prior PET-CT. There is a pathologic L5 compression fracture with mild vertebral body height loss and a superimposed superior endplate Schmorl's node deformity. There is disruption of the posterior L5 vertebral body cortex with minimal epidural tumor not resulting in neural impingement. There is marrow edema in the T11 spinous process and surrounding soft tissues  with a nondisplaced fracture evident, not imaged axially. Abnormal uptake was present in this location on the prior PET-CT, and this is indeterminate for a benign versus pathologic fracture as no discrete underlying bone lesion is identified on MRI. There is bilateral L4-5 facet edema. Conus medullaris and cauda equina: Conus extends to the L1 level. Conus and cauda equina appear normal. Paraspinal and other soft tissues: Unremarkable. Disc levels: L1-2 and L2-3: Negative. L3-4: Mild disc bulging and mild facet arthrosis without stenosis. L4-5: Disc desiccation. Mild disc bulging, mild ligamentum flavum thickening, and advanced facet arthrosis result in mild bilateral lateral recess stenosis without significant spinal or neural foraminal stenosis. There are left larger than right facet joint effusions. L5-S1: Mild facet arthrosis without disc herniation or stenosis. IMPRESSION: 1. L5 vertebral body metastasis with pathologic fracture and mild vertebral body height loss. Trace epidural tumor without stenosis. 2. T11 spinous process fracture. Electronically Signed   By: Logan Bores M.D.   On: 01/01/2018 13:47   Ct T-spine No Charge  Result Date: 01/07/2018 CLINICAL DATA:  57 y/o  F; T8 vertebral body collapse. EXAM: CT THORACIC SPINE WITHOUT CONTRAST TECHNIQUE: Multidetector CT images of the thoracic were obtained using the standard protocol without intravenous contrast. COMPARISON:  01/07/2018 CT chest. 01/01/2018 lumbar spine MRI. 11/12/2017 CT chest. FINDINGS: Alignment: Normal. Vertebrae: 12 rib-bearing vertebral bodies. T7 compression deformity with up to 60% central loss of vertebral body height. Fractures extend through the superior and. No significant bony retropulsion. No additional fracture of the thoracic spine identified. Paraspinal and other soft tissues: Emphysema and right greater than left lung base consolidations, partially visualized. Disc levels: No significant bony foraminal or canal stenosis.  IMPRESSION: 1. T7 compression deformity with up to 60% loss of vertebral body height. No bony retropulsion, foraminal stenosis, or canal stenosis. This may be metastatic, consider further characterization with thoracic MRI with and without intravenous contrast if clinically indicated. 2. No additional fracture identified. 3. No significant bony foraminal or canal stenosis. Electronically Signed   By: Kristine Garbe M.D.   On: 01/07/2018 19:13   [place] Ekg Site Rite  Result Date: 01/08/2018 If Site Rite image not attached, placement could not be confirmed due to current cardiac rhythm.   Labs:  CBC: Recent Labs    12/12/17 0808 01/06/18 2318 01/06/18 2327 01/08/18 0410 01/11/18 0355  WBC 8.1 11.3*  --  7.2 6.8  HGB 13.3 14.0 13.9 12.1 12.8  HCT 40.7 42.0 41.0 37.8 40.1  PLT 140*  267  --  214 268    COAGS: Recent Labs    11/12/17 2116 01/06/18 2318  INR 0.98 1.02    BMP: Recent Labs    12/12/17 0808 01/06/18 2318 01/06/18 2327 01/08/18 0410 01/11/18 0355  NA 134* 133* 131* 134* 137  K 4.0 3.7 3.7 3.7 4.2  CL 102 98 97* 100 97*  CO2 22 23  --  25 30  GLUCOSE 99 115* 114* 99 94  BUN 12 11 8 9 11   CALCIUM 9.9 10.6*  --  9.4 10.4*  CREATININE 0.65 0.47 0.50 0.39* 0.40*  GFRNONAA >60 >60  --  >60 >60  GFRAA >60 >60  --  >60 >60    LIVER FUNCTION TESTS: Recent Labs    11/12/17 2117 12/12/17 0808 01/06/18 2318  BILITOT 0.8 0.5 0.9  AST 20 12 18   ALT 18 15 22   ALKPHOS 74 118 142*  PROT 7.4 6.6 7.6  ALBUMIN 4.2 3.7 4.1    TUMOR MARKERS: No results for input(s): AFPTM, CEA, CA199, CHROMGRNA in the last 8760 hours.  Assessment and Plan:  My impression is that this patient has a subacute unhealed painful thoracic T7 vertebral compression fracture, likely metastatic.  Her pain is poorly controlled by a fairly aggressive narcotic pain medication regimen.  She is an appropriate candidate for consideration of kyphoplasty/vertebroplasty.  Imaging demonstrates  no significant cord compression or severe retropulsion or other contraindication to percutaneous approach.  No obvious extraosseous tumor.  I would probably also add osteocool RF ablation given the high clinical likelihood of this being secondary to metastatic lung carcinoma.  We can obtain a biopsy at the time of the procedure for confirmation.  I spent the majority of the consultation discussing the pathophysiology of vertebral compression fracture deformities, both pathologic and osteoporotic.  We discussed the natural history of these fractures and treatment options including watchful waiting, kyphoplasty/vertebroplasty/osteocool, and  open surgical repair.  We discussed in detail the kyphoplasty technique, anticipated benefits, possible risks and complications, anticipated benefits, and time course of symptom resolution.  We reviewed that this does not cure her cancer nor does it take away pain from underlying arthritis or other spondylitic change.  She and her brother who accompanied her seemed to understand and did ask appropriate questions.  She is motivated to proceed to proceed.  Accordingly, we can set her up for T8 kyphoplasty with osteo-cool and biopsy under moderate sedation, first available pending carrier approval.  Thank you for this interesting consult.  I greatly enjoyed meeting Jill [name] and look forward to participating in their care.  A copy of this report was sent to the requesting provider on this date.  Electronically Signed: Rickard Rhymes 01/23/2018, 8:59 AM   I spent a total of  40 Minutes   in face to face in clinical consultation, greater than 50% of which was counseling/coordinating care for painful T8 pathologic compression fracture.

## 2018-01-23 NOTE — Telephone Encounter (Signed)
Called to schedule kypho, no answer, left vm. AW

## 2018-01-24 ENCOUNTER — Other Ambulatory Visit: Payer: Medicare Other

## 2018-01-24 NOTE — Progress Notes (Signed)
°  Radiation Oncology         [phone] ________________________________  Name: Jill [name] MRN: [id number]  Date: 01/10/2018  DOB: 1961/06/28  End of Treatment Note  Diagnosis:   57 y.o. 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   There were no signs of acute toxicity after treatment.  Plan: The patient has completed radiation treatment. The patient will return to radiation oncology clinic for routine followup in one month. I advised the patient to call or return sooner if they have any questions or concerns related to their recovery or treatment. ________________________________  [name], MD, PhD  This document serves as a record of services personally performed by Kyung Rudd, MD. It was created on his behalf by Rae Lips, a trained medical scribe. The creation of this record is based on the scribe's personal observations and the provider's statements to them. This document has been checked and approved by the attending provider.

## 2018-01-25 ENCOUNTER — Ambulatory Visit: Payer: Medicare Other | Admitting: Internal Medicine

## 2018-01-25 ENCOUNTER — Other Ambulatory Visit: Payer: Self-pay | Admitting: Physician Assistant

## 2018-01-25 ENCOUNTER — Ambulatory Visit: Payer: Medicare Other

## 2018-01-25 ENCOUNTER — Other Ambulatory Visit: Payer: Medicare Other

## 2018-01-25 ENCOUNTER — Encounter: Payer: Medicare Other | Admitting: Nutrition

## 2018-01-29 ENCOUNTER — Other Ambulatory Visit: Payer: Self-pay | Admitting: Radiology

## 2018-01-30 ENCOUNTER — Other Ambulatory Visit: Payer: Medicare Other

## 2018-01-30 ENCOUNTER — Ambulatory Visit (HOSPITAL_COMMUNITY)
Admission: RE | Admit: 2018-01-30 | Discharge: 2018-01-30 | Disposition: A | Discharge status: Home / Self Care | Payer: Medicare Other | Source: Ambulatory Visit | Attending: Interventional Radiology | Admitting: Interventional Radiology

## 2018-01-30 ENCOUNTER — Encounter (HOSPITAL_COMMUNITY): Payer: Self-pay

## 2018-01-30 ENCOUNTER — Ambulatory Visit: Payer: Medicare Other | Admitting: Oncology

## 2018-01-30 ENCOUNTER — Other Ambulatory Visit (HOSPITAL_COMMUNITY): Payer: Self-pay | Admitting: Interventional Radiology

## 2018-01-30 ENCOUNTER — Ambulatory Visit: Payer: Medicare Other

## 2018-01-30 DIAGNOSIS — I10 Essential (primary) hypertension: Secondary | ICD-10-CM | POA: Insufficient documentation

## 2018-01-30 DIAGNOSIS — C7951 Secondary malignant neoplasm of bone: Secondary | ICD-10-CM | POA: Diagnosis not present

## 2018-01-30 DIAGNOSIS — C3490 Malignant neoplasm of unspecified part of unspecified bronchus or lung: Secondary | ICD-10-CM | POA: Insufficient documentation

## 2018-01-30 DIAGNOSIS — F209 Schizophrenia, unspecified: Secondary | ICD-10-CM | POA: Diagnosis not present

## 2018-01-30 DIAGNOSIS — S22060A Wedge compression fracture of T7-T8 vertebra, initial encounter for closed fracture: Secondary | ICD-10-CM

## 2018-01-30 DIAGNOSIS — M4854XA Collapsed vertebra, not elsewhere classified, thoracic region, initial encounter for fracture: Secondary | ICD-10-CM | POA: Insufficient documentation

## 2018-01-30 DIAGNOSIS — F17210 Nicotine dependence, cigarettes, uncomplicated: Secondary | ICD-10-CM | POA: Insufficient documentation

## 2018-01-30 DIAGNOSIS — Z885 Allergy status to narcotic agent status: Secondary | ICD-10-CM | POA: Insufficient documentation

## 2018-01-30 HISTORY — PX: IR BONE TUMOR(S)RF ABLATION: IMG2284

## 2018-01-30 HISTORY — PX: IR KYPHO THORACIC WITH BONE BIOPSY: IMG5518

## 2018-01-30 LAB — PROTIME-INR
INR: 0.99
INR: 1.03
Prothrombin Time: 13 seconds (ref 11.4–15.2)
Prothrombin Time: 13.4 seconds (ref 11.4–15.2)

## 2018-01-30 LAB — CBC
HCT: 45.2 % (ref 36.0–46.0)
Hemoglobin: 15 g/dL (ref 12.0–15.0)
MCH: 28.2 pg (ref 26.0–34.0)
MCHC: 33.2 g/dL (ref 30.0–36.0)
MCV: 85.1 fL (ref 78.0–100.0)
PLATELETS: 220 10*3/uL (ref 150–400)
RBC: 5.31 MIL/uL — ABNORMAL HIGH (ref 3.87–5.11)
RDW: 15.3 % (ref 11.5–15.5)
WBC: 8.6 10*3/uL (ref 4.0–10.5)

## 2018-01-30 LAB — APTT: aPTT: 28 seconds (ref 24–36)

## 2018-01-30 MED ORDER — FENTANYL CITRATE (PF) 100 MCG/2ML IJ SOLN
INTRAMUSCULAR | Status: AC
  Filled 2018-01-30: qty 2

## 2018-01-30 MED ORDER — OXYCODONE-ACETAMINOPHEN 5-325 MG PO TABS
1.0000 | ORAL_TABLET | Freq: Once | ORAL | Status: AC
  Administered 2018-01-30: 1 via ORAL

## 2018-01-30 MED ORDER — MIDAZOLAM HCL 2 MG/2ML IJ SOLN
INTRAMUSCULAR | Status: AC
  Filled 2018-01-30: qty 2

## 2018-01-30 MED ORDER — LIDOCAINE HCL (PF) 1 % IJ SOLN
INTRAMUSCULAR | Status: AC | PRN
  Administered 2018-01-30: 10 mL

## 2018-01-30 MED ORDER — CEFAZOLIN SODIUM-DEXTROSE 2-4 GM/100ML-% IV SOLN
2.0000 g | INTRAVENOUS | Status: AC
  Administered 2018-01-30: 2 g via INTRAVENOUS

## 2018-01-30 MED ORDER — MIDAZOLAM HCL 2 MG/2ML IJ SOLN
INTRAMUSCULAR | Status: AC | PRN
  Administered 2018-01-30 (×2): 0.5 mg via INTRAVENOUS
  Administered 2018-01-30: 1 mg via INTRAVENOUS
  Administered 2018-01-30 (×2): 0.5 mg via INTRAVENOUS

## 2018-01-30 MED ORDER — FENTANYL CITRATE (PF) 100 MCG/2ML IJ SOLN
INTRAMUSCULAR | Status: AC | PRN
  Administered 2018-01-30: 50 ug via INTRAVENOUS
  Administered 2018-01-30: 25 ug via INTRAVENOUS
  Administered 2018-01-30: 50 ug via INTRAVENOUS
  Administered 2018-01-30: 25 ug via INTRAVENOUS

## 2018-01-30 MED ORDER — LIDOCAINE HCL 1 % IJ SOLN
INTRAMUSCULAR | Status: AC
  Filled 2018-01-30: qty 20

## 2018-01-30 MED ORDER — CEFAZOLIN SODIUM-DEXTROSE 2-4 GM/100ML-% IV SOLN
INTRAVENOUS | Status: AC
  Filled 2018-01-30: qty 100

## 2018-01-30 MED ORDER — IOPAMIDOL (ISOVUE-300) INJECTION 61%
INTRAVENOUS | Status: AC
  Administered 2018-01-30: 1 mL
  Filled 2018-01-30: qty 50

## 2018-01-30 MED ORDER — OXYCODONE-ACETAMINOPHEN 5-325 MG PO TABS
ORAL_TABLET | ORAL | Status: AC
  Administered 2018-01-30: 1 via ORAL
  Filled 2018-01-30: qty 1

## 2018-01-30 NOTE — Procedures (Signed)
T7 OsteoCool ablation T7 KP T7 Bone Bx  EBL 0 Comp 0

## 2018-01-30 NOTE — Discharge Instructions (Signed)
KYPHOPLASTY/VERTEBROPLASTY DISCHARGE INSTRUCTIONS ° °Medications: (check all that apply) ° °   Resume all home medications as before procedure. °                 °  °Continue your pain medications as prescribed as needed.  Over the next 3-5 days, decrease your pain medication as tolerated.  Over the counter medications (i.e. Tylenol, ibuprofen, and aleve) may be substituted once severe/moderate pain symptoms have subsided. ° ° Wound Care: °- Bandages may be removed the day following your procedure.  You may get your incision wet once bandages are removed.  Bandaids may be used to cover the incisions until scab formation.  Topical ointments are optional. ° °- If you develop a fever greater than 101 degrees, have increased skin redness at the incision sites or pus-like oozing from incisions occurring within 1 week of the procedure, contact radiology at [phone] or [phone]. ° °- Ice pack to back for 15-20 minutes 2-3 time per day for first 2-3 days post procedure.  The ice will expedite muscle healing and help with the pain from the incisions. ° ° Activity: °- Bedrest today with limited activity for 24 hours post procedure. ° °- No driving for 48 hours. ° °- Increase your activity as tolerated after bedrest (with assistance if necessary). ° °- Refrain from any strenuous activity or heavy lifting (greater than 10 lbs.). ° ° Follow up: °- Contact radiology at [phone] or [phone] if any questions/concerns. ° °- A physician assistant from radiology will contact you in approximately 1 week. ° °- If a biopsy was performed at the time of your procedure, your referring physician should receive the results in usually 2-3 days. ° ° ° ° ° ° ° ° °

## 2018-01-30 NOTE — H&P (Signed)
Chief Complaint: Patient was seen in consultation today for Thoracic 7 Kyphoplasty/ablation at the request of Dr Mayme Genta  Referring Physician(s): Dr Mayme Genta  Supervising Physician: Marybelle Killings  Patient Status: [facility] - Out-pt  History of Present Illness: Jill [name] is a 57 y.o. female   Hx Lung Ca with bony metastasis Stereotactic radiosurgery L5 met Severe pain in mid back since early this mo Pain meds no real relief Pain is worse than ever; 10/10  Was seen in consultation with Dr Vernard Gambles regarding T7/8 kyphoplasty/radiofrequency ablation 7/23 note: My impression is that this patient has a subacute unhealed painful thoracic T7 vertebral compression fracture, likely metastatic.  Her pain is poorly controlled by a fairly aggressive narcotic pain medication regimen.  She is an appropriate candidate for consideration of kyphoplasty/vertebroplasty.  Imaging demonstrates no significant cord compression or severe retropulsion or other contraindication to percutaneous approach.  No obvious extraosseous tumor.  I would probably also add osteocool RF ablation given the high clinical likelihood of this being secondary to metastatic lung carcinoma.  We can obtain a biopsy at the time of the procedure for confirmation.  Scheduled now for KP/Ostecool procedure   Past Medical History:  Diagnosis Date  . Cancer ([place])   . Hypertension   . Schizophrenia [employer])     Past Surgical History:  Procedure Laterality Date  . HERNIA REPAIR    . IR RADIOLOGIST EVAL & MGMT  01/23/2018  . KIDNEY SURGERY    . RADIOLOGY WITH ANESTHESIA N/A 01/12/2018   Procedure: MRI WITH ANESTHESIA;  Surgeon: Radiologist, Medication, MD;  Location: [place];  Service: Radiology;  Laterality: N/A;  . VIDEO BRONCHOSCOPY Bilateral 11/14/2017   Procedure: VIDEO BRONCHOSCOPY WITH FLUORO;  Surgeon: Chesley Mires, MD;  Location: [facility];  Service: Endoscopy;  Laterality: Bilateral;    Allergies: Ibuprofen; Morphine  and related; and Prednisone  Medications: Prior to Admission medications   Medication Sig Start Date End Date Taking? Authorizing Provider  acetaminophen (TYLENOL) 500 MG tablet Take 1,000 mg by mouth every 6 (six) hours as needed for moderate pain.    Yes [provider]  amitriptyline (ELAVIL) 50 MG tablet Take 1 tablet (50 mg total) by mouth at bedtime. 11/21/17  Yes Vaslow, Acey Lav, MD  benzonatate (TESSALON) 100 MG capsule Take 100 mg by mouth 3 (three) times daily as needed for cough.   Yes [provider]  dexamethasone (DECADRON) 2 MG tablet Take 1 tablet (2 mg total) by mouth daily. 12/07/17  Yes Vaslow, Acey Lav, MD  gabapentin (NEURONTIN) 100 MG capsule Take 100 mg by mouth daily as needed (pain).  12/27/17  Yes [provider]  haloperidol (HALDOL) 5 MG tablet Take 5 mg by mouth 2 (two) times daily.   Yes [provider]  lisinopril-hydrochlorothiazide (PRINZIDE,ZESTORETIC) 20-25 MG tablet Take 1 tablet by mouth daily.   Yes [provider]  nicotine (NICODERM CQ - DOSED IN MG/24 HOURS) 14 mg/24hr patch Place 14 mg onto the skin daily.    Yes [provider]  ondansetron (ZOFRAN) 8 MG tablet Take 1 tablet (8 mg total) by mouth every 8 (eight) hours as needed for nausea or vomiting. 12/06/17  Yes Vaslow, Acey Lav, MD  OxyCODONE HCl, Abuse [place], (OXAYDO) 5 MG TABA Take 1 tablet by mouth every 6 (six) hours as needed (PAIN).   Yes [provider]  oxyCODONE-acetaminophen (PERCOCET/ROXICET) 5-325 MG tablet Take 1 tablet by mouth every 6 (six) hours as needed for severe pain.  Yes [provider]  polyethylene glycol (MIRALAX / GLYCOLAX) packet [place] 1 PACKET TWICE DAILY Patient taking differently: [place] 1 PACKET TWICE DAILY AS NEEDED 12/08/17  Yes Kyung Rudd, MD  rosuvastatin (CRESTOR) 20 MG tablet Take 20 mg by mouth every evening.    Yes [provider]  senna-docusate (SENOKOT-S) 8.6-50 MG  tablet Take 1 tablet by mouth 2 (two) times daily. Patient taking differently: Take 1 tablet by mouth 2 (two) times daily as needed for mild constipation.  11/18/17  Yes Hall, Carole N, DO  tiZANidine (ZANAFLEX) 4 MG tablet Take 4 mg by mouth every 6 (six) hours as needed for muscle spasms.  12/27/17  Yes [provider]  fentaNYL (DURAGESIC - DOSED MCG/HR) 12 MCG/HR Place 2 patches (25 mcg total) onto the skin every 3 (three) days. Patient not taking: Reported on 01/23/2018 01/16/18   Patrecia Pour, Christean Grief, MD  ipratropium-albuterol (DUONEB) 0.5-2.5 (3) MG/3ML SOLN Take 3 mLs by nebulization every 4 (four) hours as needed. Patient taking differently: Take 3 mLs by nebulization every 4 (four) hours as needed (shortness of breath).  11/18/17   Kayleen Memos, DO     Family History  Problem Relation Age of Onset  . Cancer Mother     Social History   Socioeconomic History  . Marital status: Single    Spouse name: Not on file  . Number of children: Not on file  . Years of education: Not on file  . Highest education level: Not on file  Occupational History  . Not on file  Social Needs  . Financial resource strain: Not on file  . Food insecurity:    Worry: Not on file    Inability: Not on file  . Transportation needs:    Medical: Not on file    Non-medical: Not on file  Tobacco Use  . Smoking status: Current Every Day Smoker    Packs/day: 2.00    Years: 36.00    Pack years: 72.00    Types: Cigarettes    Start date: 10/31/1981  . Smokeless tobacco: Never Used  . Tobacco comment: HAS CALLED QUIT 4 FREE ALREADY  Substance and Sexual Activity  . Alcohol use: Never    Frequency: Never  . Drug use: Never  . Sexual activity: Not on file  Lifestyle  . Physical activity:    Days per week: Not on file    Minutes per session: Not on file  . Stress: Not on file  Relationships  . Social connections:    Talks on phone: Not on file    Gets together: Not on file    Attends religious  service: Not on file    Active member of club or organization: Not on file    Attends meetings of clubs or organizations: Not on file    Relationship status: Not on file  Other Topics Concern  . Not on file  Social History Narrative  . Not on file    Review of Systems: A 12 point ROS discussed and pertinent positives are indicated in the HPI above.  All other systems are negative.  Review of Systems  Constitutional: Positive for activity change. Negative for fatigue, fever and unexpected weight change.  Respiratory: Positive for cough. Negative for shortness of breath.   Gastrointestinal: Negative for abdominal pain and nausea.  Musculoskeletal: Positive for back pain and gait problem.  Neurological: Negative for weakness.  Psychiatric/Behavioral: Negative for behavioral problems and confusion.  Vital Signs: BP 104/75   Pulse (!) 126   Temp 98.1 F (36.7 C) (Oral)   Resp 20   Ht 5' 1" (1.549 m)   Wt 175 lb (79.4 kg)   SpO2 96% Comment: 3L  BMI 33.07 kg/m   Physical Exam  Constitutional: She is oriented to person, place, and time.  Cardiovascular: Normal rate, regular rhythm and normal heart sounds.  Pulmonary/Chest: Effort normal and breath sounds normal. She has no wheezes.  Abdominal: Soft. Bowel sounds are normal. There is no tenderness.  Musculoskeletal: Normal range of motion. She exhibits tenderness.  Mid back pain  Neurological: She is alert and oriented to person, place, and time.  Skin: Skin is warm and dry.  Psychiatric: She has a normal mood and affect. Her behavior is normal. Judgment and thought content normal.  Vitals reviewed.   Imaging: Dg Chest 2 View  Result Date: 01/06/2018 CLINICAL DATA:  Short of breath EXAM: CHEST - 2 VIEW COMPARISON:  CT 11/12/2017, radiograph 10/24/2017 FINDINGS: Right infrahilar consolidation presumably corresponding to CT demonstrated lung mass. Increased atelectasis or infiltrate at the left base. Small pleural effusions.  Stable heart size. No pneumothorax. IMPRESSION: 1. Development of small pleural effusion and streaky infiltrate at the left lung base. 2. Right hilar and infrahilar opacity presumably corresponding to nodes and consolidation noted on prior CT. Electronically Signed   By: Donavan Foil M.D.   On: 01/06/2018 23:08   Ct Angio Chest Pe W And/or Wo Contrast  Result Date: 01/07/2018 CLINICAL DATA:  Shortness of breath for 1 week. Back pain and constipation for 2 weeks. Patient is receiving radiation treatment for lung cancer. EXAM: CT ANGIOGRAPHY CHEST WITH CONTRAST TECHNIQUE: Multidetector CT imaging of the chest was performed using the standard protocol during bolus administration of intravenous contrast. Multiplanar CT image reconstructions and MIPs were obtained to evaluate the vascular anatomy. CONTRAST:  116m ISOVUE-370 IOPAMIDOL (ISOVUE-370) INJECTION 76% COMPARISON:  11/12/2017 FINDINGS: Cardiovascular: There is good opacification of the central and segmental pulmonary arteries. No focal filling defects are demonstrated. No evidence of significant pulmonary embolus. Normal caliber thoracic aorta. No aortic dissection. Scattered aortic calcifications. Normal heart size. No pericardial effusion. Coronary artery calcifications. Mediastinum/Nodes: Esophagus is decompressed. Small esophageal hiatal hernia. Mild prominence of lymph nodes in the right paratracheal region and right hilum. Largest nodes measure up to about 12 mm in diameter. No change in appearance of lymph nodes since the previous study. Lungs/Pleura: Right posterior hilar mass lesion measuring about 3.1 cm diameter. Size is similar to previous study. Since the previous study, there is interval development of consolidation and volume loss in the right lower lobe. This likely represents postobstructive change with the mass lesion likely causing obstruction of the right lower lobe bronchus. There is also focal consolidation in the left lower lung. This  could be due to atelectasis or pneumonia. Emphysematous changes in the lungs. No pneumothorax. No pleural effusions. Upper Abdomen: No acute abnormality. Musculoskeletal: Compression of a midthoracic vertebral body, probably T8, new since previous study. This is likely a metastatic lesion. Lucent lesion, likely metastasis in the left posterior second rib. Review of the MIP images confirms the above findings. IMPRESSION: 1. No evidence of significant pulmonary embolus. 2. Re-identified occasion of a 3 cm right hilar mass, similar to previous study. Borderline mediastinal lymph nodes without change. 3. New collapse and consolidation of the right lower lobe, likely due to obstruction. 4. New consolidation in the left lower lung likely due to atelectasis or pneumonia.  5. Emphysematous changes in the lungs. 6. Aortic atherosclerosis and coronary artery calcifications. 7. New compression of the T8 vertebra, likely metastatic. Lucent metastatic lesion demonstrated in the left second rib. Aortic Atherosclerosis (ICD10-I70.0) and Emphysema (ICD10-J43.9). Electronically Signed   By: Lucienne Capers M.D.   On: 01/07/2018 01:41   Mr Thoracic Spine W Wo Contrast  Result Date: 01/12/2018 CLINICAL DATA:  57 year old female with metastatic lung cancer. Status post stereotactic radio surgery to L5 vertebral metastasis 2 days ago. New T8 vertebral body compression fracture on chest CT 01/07/2018. EXAM: MRI THORACIC WITHOUT AND WITH CONTRAST TECHNIQUE: Multiplanar and multiecho pulse sequences of the thoracic spine were obtained without and with intravenous contrast. CONTRAST:  45m MULTIHANCE GADOBENATE DIMEGLUMINE 529 MG/ML IV SOLN COMPARISON:  Chest CT 01/07/2018. Lumbar MRI 01/01/2018. PET-CT 11/08/2017. FINDINGS: Limited cervical spine imaging: Negative, normal cervical spine marrow signal, vertebral height and alignment. Thoracic spine segmentation: Normal, however the abnormal thoracic vertebra demonstrated recently by CT  is T7, not T8. Alignment: Stable since 01/07/2018. Mild focal kyphosis about the abnormal T7 vertebra. No spondylolisthesis. Vertebrae: Severe collapse of the T7 vertebral body with loss of height approaching 66%. On T1 weighted images there is a horizontal fracture line spanning the vertebral body (series 5, image 8). There is little associated marrow edema on STIR imaging, and following contrast the central collapsed portion of the vertebra is largely nonenhancing. There is mild soft tissue enhancement surrounding the T7 vertebra including along both the anterior and posterior longitudinal ligaments. There is nearby abnormal signal and enhancement in the lungs, worse on the right. There is heterogeneous STIR signal and enhancement in the bilateral T7 pedicles and lamina. The T7 spinous process remains normal, but there is abnormal STIR signal in the T7-T8 interspinous ligaments (series 4, image 5). Mild retropulsion of bone effaces the ventral CSF space (series 3, image 7 and series 6, image 23) without spinal stenosis. No definite epidural space mass. There is mild to moderate left T7 and mild right T7 foraminal stenosis. There is more focal abnormal marrow signal along the anterior inferior T6 vertebral body which appears partially compressed with enhancement which more resembles edema. Subtle patchy marrow edema and enhancement also in the posterior T8 vertebral body which remains intact. The T11 spinous process is abnormal with loss of the normal marrow signal and confluent enhancement which also extends into the T11-T12 interspinous ligament. No other thoracic spine metastasis identified. Cord: Spinal cord signal and morphology remain normal. No abnormal intradural enhancement. No dural thickening or enhancement. The conus medullaris appears normal at T12-L1. Proximal cauda equina nerve roots appear normal. Paraspinal and other soft tissues: There is confluent abnormal enhancement of the lower trapezius  muscle in the midline overlying the midthoracic spine, centered about the T7-T8 level (series 12, image 7). No discrete muscle mass. No intramuscular fluid collection. The other paraspinal soft tissues scratched at the other posterior paraspinal soft tissues are within normal limits. The lungs remain abnormal as seen on 01/07/2018, worse on the right. Nonenhancing proximal right lower lung tumor is suspected on series 13, image 29. Visible upper abdominal viscera are negative aside from distended gallbladder. Disc levels: Capacious thoracic spinal canal. No significant disc degeneration. No spinal stenosis. IMPRESSION: 1. Severe T7 vertebral body compression corresponding to the finding on the 01/07/2018 Chest CT is probably a pathologic compression fracture due to vertebral metastasis. Associated signal abnormality in the T7-T8 interspinous ligament, and overlying abnormal muscle enhancement, are most likely related to biomechanical stress. Likewise, mild  compression and abnormal signal at the anterior inferior T6 endplate is favored to be stress reaction at this time. 2. There is mild retropulsion of the T7 body, but no epidural or extraosseous tumor. Capacious spinal canal without spinal stenosis. There is mild to moderate T7 neural foraminal stenosis. 3. 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 4. No other thoracic spine metastasis.  Normal thoracic spinal cord. 5. Abnormal appearance of the lungs as seen on 01/07/2018. Electronically Signed   By: Genevie Ann M.D.   On: 01/12/2018 10:33   Mr Lumbar Spine W Wo Contrast  Result Date: 01/01/2018 CLINICAL DATA:  Metastatic lung cancer.  SRS targeting of L5 lesion. EXAM: MRI LUMBAR SPINE WITHOUT AND WITH CONTRAST TECHNIQUE: Multiplanar and multiecho pulse sequences of the lumbar spine were obtained without and with intravenous contrast. CONTRAST:  27m MULTIHANCE GADOBENATE DIMEGLUMINE 529 MG/ML  IV SOLN COMPARISON:  PET-CT 11/08/2017 FINDINGS: Segmentation:  Standard. Alignment:  Normal. Vertebrae: There is extensive marrow edema throughout the L5 vertebral body with an underlying enhancing lesion measuring approximately 2.2 cm corresponding to the hypermetabolic lesion on prior PET-CT. There is a pathologic L5 compression fracture with mild vertebral body height loss and a superimposed superior endplate Schmorl's node deformity. There is disruption of the posterior L5 vertebral body cortex with minimal epidural tumor not resulting in neural impingement. There is marrow edema in the T11 spinous process and surrounding soft tissues with a nondisplaced fracture evident, not imaged axially. Abnormal uptake was present in this location on the prior PET-CT, and this is indeterminate for a benign versus pathologic fracture as no discrete underlying bone lesion is identified on MRI. There is bilateral L4-5 facet edema. Conus medullaris and cauda equina: Conus extends to the L1 level. Conus and cauda equina appear normal. Paraspinal and other soft tissues: Unremarkable. Disc levels: L1-2 and L2-3: Negative. L3-4: Mild disc bulging and mild facet arthrosis without stenosis. L4-5: Disc desiccation. Mild disc bulging, mild ligamentum flavum thickening, and advanced facet arthrosis result in mild bilateral lateral recess stenosis without significant spinal or neural foraminal stenosis. There are left larger than right facet joint effusions. L5-S1: Mild facet arthrosis without disc herniation or stenosis. IMPRESSION: 1. L5 vertebral body metastasis with pathologic fracture and mild vertebral body height loss. Trace epidural tumor without stenosis. 2. T11 spinous process fracture. Electronically Signed   By: ALogan BoresM.D.   On: 01/01/2018 13:47   Ct T-spine No Charge  Result Date: 01/07/2018 CLINICAL DATA:  57y/o  F; T8 vertebral body collapse. EXAM: CT THORACIC SPINE WITHOUT CONTRAST TECHNIQUE: Multidetector CT  images of the thoracic were obtained using the standard protocol without intravenous contrast. COMPARISON:  01/07/2018 CT chest. 01/01/2018 lumbar spine MRI. 11/12/2017 CT chest. FINDINGS: Alignment: Normal. Vertebrae: 12 rib-bearing vertebral bodies. T7 compression deformity with up to 60% central loss of vertebral body height. Fractures extend through the superior and. No significant bony retropulsion. No additional fracture of the thoracic spine identified. Paraspinal and other soft tissues: Emphysema and right greater than left lung base consolidations, partially visualized. Disc levels: No significant bony foraminal or canal stenosis. IMPRESSION: 1. T7 compression deformity with up to 60% loss of vertebral body height. No bony retropulsion, foraminal stenosis, or canal stenosis. This may be metastatic, consider further characterization with thoracic MRI with and without intravenous contrast if clinically indicated. 2. No additional fracture identified. 3. No significant bony foraminal or canal stenosis. Electronically Signed  By: Kristine Garbe M.D.   On: 01/07/2018 19:13   [place] Ekg Site Rite  Result Date: 01/08/2018 If Site Rite image not attached, placement could not be confirmed due to current cardiac rhythm.  Ir Radiologist Eval & Mgmt  Result Date: 01/23/2018 Please refer to notes tab for details about interventional procedure. (Op Note)   Labs:  CBC: Recent Labs    01/06/18 2318 01/06/18 2327 01/08/18 0410 01/11/18 0355 01/30/18 0757  WBC 11.3*  --  7.2 6.8 8.6  HGB 14.0 13.9 12.1 12.8 15.0  HCT 42.0 41.0 37.8 40.1 45.2  PLT 267  --  214 268 220    COAGS: Recent Labs    11/12/17 2116 01/06/18 2318 01/30/18 0757  INR 0.98 1.02 0.99    BMP: Recent Labs    12/12/17 0808 01/06/18 2318 01/06/18 2327 01/08/18 0410 01/11/18 0355  NA 134* 133* 131* 134* 137  K 4.0 3.7 3.7 3.7 4.2  CL 102 98 97* 100 97*  CO2 22 23  --  25 30  GLUCOSE 99 115* 114* 99 94  BUN  _0 CALCIUM 9.9 10.6*  --  9.4 10.4*  CREATININE 0.65 0.47 0.50 0.39* 0.40*  GFRNONAA >60 >60  --  >60 >60  GFRAA >60 >60  --  >60 >60    LIVER FUNCTION TESTS: Recent Labs    11/12/17 2117 12/12/17 0808 01/06/18 2318  BILITOT 0.8 0.5 0.9  AST _1 ALT _2 ALKPHOS 74 118 142*  PROT 7.4 6.6 7.6  ALBUMIN 4.2 3.7 4.1    TUMOR MARKERS: No results for input(s): AFPTM, CEA, CA199, CHROMGRNA in the last 8760 hours.  Assessment and Plan:  Hx Lung Ca Bony metastasis Thoracic 7/8 painful fracture Was consulted IR Clinic for kyphoplasty/radiofrequency ablation Scheduled now for same Risks and benefits of Thoracic 7/8 kyphoplasty/Osteocool procedure were discussed with the patient including, but not limited to education regarding the natural healing process of compression fractures without intervention, bleeding, infection, cement migration which may cause spinal cord damage, paralysis, pulmonary embolism or even death.  This interventional procedure involves the use of X-rays and because of the nature of the planned procedure, it is possible that we will have prolonged use of X-ray fluoroscopy.  Potential radiation risks to you include (but are not limited to) the following: - A slightly elevated risk for cancer  several years later in life. This risk is typically less than 0.5% percent. This risk is low in comparison to the normal incidence of human cancer, which is 33% for women and 50% for men according to the [place]. - Radiation induced injury can include skin redness, resembling a rash, tissue breakdown / ulcers and hair loss (which can be temporary or permanent).   The likelihood of either of these occurring depends on the difficulty of the procedure and whether you are sensitive to radiation due to previous procedures, disease, or genetic conditions.   IF your procedure requires a prolonged use of radiation, you will be notified and given  written instructions for further action.  It is your responsibility to monitor the irradiated area for the 2 weeks following the procedure and to notify your physician if you are concerned that you have suffered a radiation induced injury.    All of the patient's questions were answered, patient is agreeable to proceed.  Consent signed and in chart.  Thank you for this interesting consult.  I greatly enjoyed  meeting Jill [name] and look forward to participating in their care.  A copy of this report was sent to the requesting provider on this date.  Electronically Signed: Lavonia Drafts, PA-C 01/30/2018, 8:40 AM   I spent a total of  30 Minutes   in face to face in clinical consultation, greater than 50% of which was counseling/coordinating care for T7/8 KP/osteocool

## 2018-02-05 ENCOUNTER — Other Ambulatory Visit: Payer: Self-pay | Admitting: Radiation Therapy

## 2018-02-05 DIAGNOSIS — C7951 Secondary malignant neoplasm of bone: Secondary | ICD-10-CM

## 2018-02-06 ENCOUNTER — Telehealth: Payer: Self-pay | Admitting: *Deleted

## 2018-02-06 ENCOUNTER — Telehealth: Payer: Self-pay | Admitting: Radiology

## 2018-02-06 NOTE — Telephone Encounter (Signed)
Patient called questioning if we could refill her pain medication for her back pain and new shoulder pain.  Recently had kyphoplasty completed by Dr Barbie Banner.  Advised her to call that physician to request refill.  Dr Mickeal Skinner is only managing her headaches.

## 2018-02-06 NOTE — Telephone Encounter (Signed)
Patient states that pain has improved one week post kyphoplasty.  However, she has requested a refill in pain Rx from her primary care physician.    States that she does not want to schedule a follow up appointment at this time.    Lerry Cordrey Riki Rusk, RN 02/06/2018 10:26 AM

## 2018-02-07 ENCOUNTER — Ambulatory Visit
Admission: RE | Admit: 2018-02-07 | Discharge: 2018-02-07 | Disposition: A | Discharge status: Home / Self Care | Payer: Medicare Other | Source: Ambulatory Visit | Attending: Radiation Oncology | Admitting: Radiation Oncology

## 2018-02-07 ENCOUNTER — Inpatient Hospital Stay: Admission: RE | Admit: 2018-02-07 | Payer: Medicare Other | Source: Ambulatory Visit

## 2018-02-07 ENCOUNTER — Other Ambulatory Visit: Payer: Self-pay | Admitting: Radiation Oncology

## 2018-02-07 ENCOUNTER — Encounter: Payer: Self-pay | Admitting: Radiation Oncology

## 2018-02-07 ENCOUNTER — Telehealth: Payer: Self-pay | Admitting: Medical Oncology

## 2018-02-07 ENCOUNTER — Other Ambulatory Visit: Payer: Self-pay

## 2018-02-07 VITALS — BP 97/75 | HR 62 | Temp 98.6°F | Resp 20 | Ht 61.0 in | Wt 160.8 lb

## 2018-02-07 DIAGNOSIS — C7931 Secondary malignant neoplasm of brain: Secondary | ICD-10-CM | POA: Diagnosis not present

## 2018-02-07 DIAGNOSIS — C3431 Malignant neoplasm of lower lobe, right bronchus or lung: Secondary | ICD-10-CM | POA: Insufficient documentation

## 2018-02-07 DIAGNOSIS — C7951 Secondary malignant neoplasm of bone: Secondary | ICD-10-CM

## 2018-02-07 DIAGNOSIS — Z885 Allergy status to narcotic agent status: Secondary | ICD-10-CM | POA: Diagnosis not present

## 2018-02-07 DIAGNOSIS — Z886 Allergy status to analgesic agent status: Secondary | ICD-10-CM | POA: Diagnosis not present

## 2018-02-07 DIAGNOSIS — Z79899 Other long term (current) drug therapy: Secondary | ICD-10-CM | POA: Diagnosis not present

## 2018-02-07 DIAGNOSIS — M25512 Pain in left shoulder: Secondary | ICD-10-CM | POA: Diagnosis not present

## 2018-02-07 DIAGNOSIS — Z888 Allergy status to other drugs, medicaments and biological substances status: Secondary | ICD-10-CM | POA: Diagnosis not present

## 2018-02-07 MED ORDER — OXYCODONE HCL 5 MG PO TABA: 1.0000 | ORAL_TABLET | Freq: Four times a day (QID) | ORAL | 0 refills | Status: DC | PRN

## 2018-02-07 NOTE — Progress Notes (Signed)
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  IR Kypho Thoracic with Bone Biopsy IMPRESSION:01-30-18  Successful T7 biopsy, radiofrequency ablation, and kyphoplasty as described above.   IMPRESSION:01-12-18  MR Thoracic Spine W WO Contrast 1. Severe T7 vertebral body compression corresponding to the finding on the 01/07/2018 Chest CT is probably a pathologic compression fracture due to vertebral metastasis. Associated signal abnormality in the T7-T8 interspinous ligament, and overlying abnormal muscle enhancement, are most likely related to biomechanical stress. Likewise, mild compression and abnormal signal at the anterior inferior T6 endplate is favored to be stress reaction at this time. 2. There is mild retropulsion of the T7 body, but no epidural or extraosseous tumor. Capacious spinal canal without spinal stenosis. There is mild to moderate T7 neural foraminal stenosis. 3. 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 4. No other thoracic spine metastasis. Normal thoracic spinal cord. 5. Abnormal appearance of the lungs as seen on 01/07/2018.  Sites of Visceral and Bony Metastatic Disease: T7 vertebral body,Back , Left shoulder,L5 Spinal Metastasis from metastatic lung cancer from Dr. Levan Hurst note 01-10-18    Location(s) of Symptomatic Metastases: T7 vertebral body,Back, Left shoulder  Past/Anticipated chemotherapy by medical oncology, if any:12-07-17 Dr. Mickeal Skinner saw her for chronic migraines, headaches are better  12-12-17 Dr. Julien Nordmann right lower lobe lung mass    Current Therapy  Single agent Keytruda 200 mg IV every 3 weeks.  First dose expected on 12/21/2017.   Pain on a scale of  0-10 is:  7/10 taking Oxycodone.Neurontin,Zanaflex   If Spine Met(s), symptoms, if any, include:  Bowel/Bladder retention or incontinence (please describe): Bowel movement every three days,voiding almost hourly  Numbness or weakness in extremities (please describe): No,reports spasms in her thighs when she takes a deep breath for about two months.  Current Decadron regimen, if applicable: Stopped about one month ago before her surgery  Ambulatory status? Walker? Wheelchair?: Using a cane  SAFETY ISSUES:No Prior radiation? :01-10-18  Lumbar Spine (L5)  18 Gy in 1 fraction,  12-07-17 Brain PTV1: Left Parietal 67m // 20  Gy in 1 fraction  11-16-17 11-30-17  RLL Lung / 30 Gy in 10  fractions   Pacemaker/ICD? :No  Possible current pregnancy? :No  Is the patient on methotrexate?:No  Current Complaints / other details: History of schizophrenia   [facility] service weekly will have a occupational therapist visit her next week to evaluate her home.  Wt Readings from Last 3 Encounters:  02/07/18 160 lb 12.8 oz (72.9 kg)  01/30/18 175 lb (79.4 kg)  01/23/18 175 lb (79.4 kg)  BP 97/75 (BP Location: Left Arm, Patient Position: Sitting, Cuff Size: Normal)   Pulse 62   Temp 98.6 F (37 C)   Resp 20   Ht '5\' 1"'  (1.549 m)   Wt 160 lb 12.8 oz (72.9 kg)   SpO2 94%   BMI 30.38 kg/m  B/P 98/62 66 pulse left arm

## 2018-02-07 NOTE — Telephone Encounter (Signed)
I Returned pt call who left message that she was having a lot of back pain ( she had a "back injection " on 7/30 ) and and Right shoulder pain -she  thinks she pulled it two weeks ago. I returned call and left message to call me back. States Oxycodone not helping. 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  She was also counseled on the use of Tylenol as she is unable to tolerate Advil or ibuprofen products.  She was also encouraged to try icing this area as she had tried previously to use heat without significant improvement after further questioning"

## 2018-02-07 NOTE — Addendum Note (Signed)
Encounter addended by: Malena Edman, RN on: 02/07/2018 4:54 PM  Actions taken: Check In activity completed

## 2018-02-07 NOTE — Progress Notes (Signed)
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  The patient has a history of Stage IV, NSCLC, adenocarcinoma of the RLL lung. She was diagnosed in May 2019 and began palliative radiotherapy due to postobstructive changes.  Patient received palliative radiotherapy to the right lower lobe, and and her staging work-up also was found to have a lesion in the left parietal lobe of the brain and received SRS treatment.  She also had pet imaging that revealed concerns for disease in her lumbar spine, she underwent SRS treatment on 01/10/2018 2 L5.  She does have known disease as well in               The thoracic spine and was admitted a few weeks ago due to pain.  Her CT scan of the thoracic spine revealed concerns for disease at T7 with compression fracture as well as the spinous process of T11 though she was asymptomatic at T11.  She underwent MRI of the thoracic spine with general anesthesia on 01/12/2018 which confirmed these findings.  She has also underwent IR biopsy with vertebral augmentation and osteo-cool ablation.  She is scheduled to undergo SRS of the T7, and comes today with increasing mid back pain as well as left shoulder pain.  She states that her left shoulder pain started a few weeks ago, and she has been taking her pain medication as well as a muscle relaxer.  The muscle relaxer does not seem to help her symptoms at  all, and she seems to favor this when walking with a cane due to the pain in her back.  She states that in the last few days, she is also noticed that she wakes up in the middle the night with pain in the curvature of her spine, and when she is awake this also seems to trigger some of her left shoulder pain.  She denies any radiculopathy, or neck pain.  She states that she is not having any weakness, or sensory disturbances.  She denies any lower extremity loss of sensation or motor function or changes in bowel or bladder continence.  She states that she has not had any numbness or tingling in her abdomen or low pelvic area or of the upper part of her chest.  She states that her pain is really along the [facility] joint and the upper shoulder region as well as over the head of the humerus when moving her arm laterally or with resistance.  She describes a sense of dull aching pain when trying to put on her shirt.  She denies any fevers or chills chest pain or shortness of breath otherwise.  No other complaints are verbalized.               ALLERGIES:  is allergic to ibuprofen; morphine and related; and prednisone.  Meds: Current Outpatient Medications  Medication Sig Dispense Refill  . acetaminophen (TYLENOL) 500 MG tablet Take 1,000 mg by mouth every 6 (six) hours as needed for moderate pain.     Marland Kitchen gabapentin (NEURONTIN) 100 MG capsule Take 100 mg by mouth daily as needed (pain).     . haloperidol (HALDOL) 5 MG tablet Take 5 mg by mouth 2 (two) times daily.    Marland Kitchen lisinopril-hydrochlorothiazide (PRINZIDE,ZESTORETIC) 20-25 MG tablet Take 1 tablet by mouth daily.    . nicotine (NICODERM CQ - DOSED IN MG/24 HOURS) 14 mg/24hr patch Place 14 mg onto the skin daily.     . OxyCODONE HCl, Abuse Deter, (OXAYDO) 5 MG TABA Take 1 tablet by mouth every 6 (six) hours as needed (PAIN). 120 tablet 0  . polyethylene glycol (MIRALAX / GLYCOLAX) packet MIX AND DRINK 1 PACKET TWICE DAILY (Patient taking differently: MIX AND DRINK  1 PACKET TWICE DAILY AS NEEDED) 14 packet 0  . rosuvastatin (CRESTOR) 20 MG tablet Take 20 mg by mouth every evening.     . senna-docusate (SENOKOT-S) 8.6-50 MG tablet Take 1 tablet by mouth 2 (two) times daily. (Patient taking differently: Take 1 tablet by mouth 2 (two) times daily as needed for mild constipation. ) 30 tablet 0  . amitriptyline (ELAVIL) 50 MG tablet Take 1 tablet (50 mg total) by mouth at bedtime. (Patient not taking: Reported on 02/07/2018) 30 tablet 3  . benzonatate (TESSALON) 100 MG capsule Take 100 mg by mouth 3 (three) times daily as needed for cough.    . dexamethasone (DECADRON) 2 MG tablet Take 1 tablet (2 mg total) by mouth daily. (Patient not taking: Reported on 02/07/2018) 30 tablet 2  . fentaNYL (DURAGESIC - DOSED MCG/HR) 12 MCG/HR Place 2 patches (25 mcg total) onto the skin every 3 (three) days. (Patient not taking: Reported on 01/23/2018) 4 patch 0  . ipratropium-albuterol (DUONEB) 0.5-2.5 (3) MG/3ML SOLN Take 3 mLs by nebulization every 4 (four) hours as needed. (Patient not taking: Reported on 02/07/2018) 360 mL 0  . ondansetron (ZOFRAN) 8 MG tablet Take 1 tablet (8 mg total) by mouth every 8 (eight) hours as needed for nausea or vomiting. (Patient not taking: Reported on 02/07/2018) 60 tablet 2   No current facility-administered medications for this encounter.     Physical Findings:  height is 5\' 1"  (1.549 m) and weight is 160 lb 12.8 oz (72.9 kg). Her temperature is 98.6 F (37 C). Her blood pressure is 97/75 and her pulse is 62. Her respiration is 20 and oxygen saturation is 94%.  Pain Assessment Pain Score: 7  Pain Loc: Back(Back and left shoulder)/10 In general this is a well appearing caucasian female in no acute distress. She's alert and oriented x4 and appropriate throughout the examination. Cardiopulmonary assessment is negative for acute distress and she exhibits normal effort. Evaluation of her spine reveals point tenderness along T11-T12, no pain superior at  T7, or lower L5.  She does have point tenderness as well along the acromion process along the left clavicle and into the left shoulder.  She experiences reproducible pain with abduction when resistance is placed over the upper arm on the left.  She has 5 out of 5 motor strength and intact sensation to light touch bilaterally.  She has intact range of motion but this is limited at 90 degrees with the left upper extremity.   Lab Findings: Lab Results  Component Value Date   WBC 8.6 01/30/2018   HGB 15.0 01/30/2018   HCT 45.2  01/30/2018   MCV 85.1 01/30/2018   PLT 220 01/30/2018     Radiographic Findings: Mr Thoracic Spine W Wo Contrast  Result Date: 01/12/2018 CLINICAL DATA:  57 year old female with metastatic lung cancer. Status post stereotactic radio surgery to L5 vertebral metastasis 2 days ago. New T8 vertebral body compression fracture on chest CT 01/07/2018. EXAM: MRI THORACIC WITHOUT AND WITH CONTRAST TECHNIQUE: Multiplanar and multiecho pulse sequences of the thoracic spine were obtained without and with intravenous contrast. CONTRAST:  4mL MULTIHANCE GADOBENATE DIMEGLUMINE 529 MG/ML IV SOLN COMPARISON:  Chest CT 01/07/2018. Lumbar MRI 01/01/2018. PET-CT 11/08/2017. FINDINGS: Limited cervical spine imaging: Negative, normal cervical spine marrow signal, vertebral height and alignment. Thoracic spine segmentation: Normal, however the abnormal thoracic vertebra demonstrated recently by CT is T7, not T8. Alignment: Stable since 01/07/2018. Mild focal kyphosis about the abnormal T7 vertebra. No spondylolisthesis. Vertebrae: Severe collapse of the T7 vertebral body with loss of height approaching 66%. On T1 weighted images there is a horizontal fracture line spanning the vertebral body (series 5, image 8). There is little associated marrow edema on STIR imaging, and following contrast the central collapsed portion of the vertebra is largely nonenhancing. There is mild soft tissue enhancement  surrounding the T7 vertebra including along both the anterior and posterior longitudinal ligaments. There is nearby abnormal signal and enhancement in the lungs, worse on the right. There is heterogeneous STIR signal and enhancement in the bilateral T7 pedicles and lamina. The T7 spinous process remains normal, but there is abnormal STIR signal in the T7-T8 interspinous ligaments (series 4, image 5). Mild retropulsion of bone effaces the ventral CSF space (series 3, image 7 and series 6, image 23) without spinal stenosis. No definite epidural space mass. There is mild to moderate left T7 and mild right T7 foraminal stenosis. There is more focal abnormal marrow signal along the anterior inferior T6 vertebral body which appears partially compressed with enhancement which more resembles edema. Subtle patchy marrow edema and enhancement also in the posterior T8 vertebral body which remains intact. The T11 spinous process is abnormal with loss of the normal marrow signal and confluent enhancement which also extends into the T11-T12 interspinous ligament. No other thoracic spine metastasis identified. Cord: Spinal cord signal and morphology remain normal. No abnormal intradural enhancement. No dural thickening or enhancement. The conus medullaris appears normal at T12-L1. Proximal cauda equina nerve roots appear normal. Paraspinal and other soft tissues: There is confluent abnormal enhancement of the lower trapezius muscle in the midline overlying the midthoracic spine, centered about the T7-T8 level (series 12, image 7). No discrete muscle mass. No intramuscular fluid collection. The other paraspinal soft tissues scratched at the other posterior paraspinal soft tissues are within normal limits. The lungs remain abnormal as seen on 01/07/2018, worse on the right. Nonenhancing proximal right lower lung tumor is suspected on series 13, image 29. Visible upper abdominal viscera are negative aside from distended gallbladder.  Disc levels: Capacious thoracic spinal canal. No significant disc degeneration. No spinal stenosis. IMPRESSION: 1. Severe T7 vertebral body compression corresponding to the finding on the 01/07/2018 Chest CT is probably a pathologic compression fracture due to vertebral metastasis. Associated signal abnormality in the T7-T8 interspinous ligament, and overlying abnormal muscle enhancement, are most likely related to biomechanical stress. Likewise, mild compression and abnormal signal at the anterior inferior T6 endplate is favored to be stress reaction at this time. 2. There is mild retropulsion of the T7 body, but no epidural or extraosseous tumor. Capacious spinal  canal without spinal stenosis. There is mild to moderate T7 neural foraminal stenosis. 3. 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 4. No other thoracic spine metastasis.  Normal thoracic spinal cord. 5. Abnormal appearance of the lungs as seen on 01/07/2018. Electronically Signed   By: Genevie Ann M.D.   On: 01/12/2018 10:33   Ir Bone Tumor(s)rf Ablation  Result Date: 01/30/2018 INDICATION: T7 pathologic fracture.  Lung cancer. EXAM: T7 ABLATION AND KYPHOPLASTY COMPARISON:  01/12/2018 MEDICATIONS: Ancef 2 g; The antibiotic was administered in an appropriate time interval prior to needle puncture of the skin. ANESTHESIA/SEDATION: General - as administered by the Anesthesia department Moderate Sedation Time:  59 minutes The patient was continuously monitored during the procedure by the interventional radiology nurse under my direct supervision. FLUOROSCOPY TIME:  Fluoroscopy Time: 12 minutes 0 seconds (165 mGy). COMPLICATIONS: None immediate. TECHNIQUE: Informed written consent was obtained from the patient after a thorough discussion of the procedural risks, benefits and alternatives. All questions were addressed. Maximal Sterile Barrier Technique was utilized including caps, mask,  sterile gowns, sterile gloves, sterile drape, hand hygiene and skin antiseptic. A timeout was performed prior to the initiation of the procedure. In the prone position, the back was prepped and draped in a sterile fashion and lidocaine was utilized for local anesthesia. Under fluoroscopic guidance, a 10 gauge needle was inserted into the T7 vertebral body via right pedicle approach. A cement trocar was then inserted for bone biopsy. The measuring drill was then advanced into the vertebral body yielding a measurements suitable for a 15 mm ablation probe. The identical procedure was performed for the left pedicle. The radiofrequency ablation probes were then inserted into both cannula needles and positioned in the T7 vertebral body. The TIPS are positioned in close proximity to each other. Ablation was successfully performed for 15 minutes. The probes were removed. Fifteen 2 balloons were inserted into each cannula needles and insufflated to 200 pounds per square inch. The needles were removed and cement was deposited through each pedicle utilizing the cement delivery system and cement trocar needles. The trocar needles and outer cannula needles were removed. There is no extraosseous cement. FINDINGS: Imaging documents needle placement in the T7 vertebral body via bipedicular approach Subsequent imaging demonstrates drill placement in the vertebral bodies. Next series of images demonstrate placement of radiofrequency probes within the T7 vertebral body and a by pedicle approach. Subsequent imaging documents by pedicle kyphoplasty Final imaging demonstrates cement filling the vertebral body and crossing the midline without extraosseous cement. IMPRESSION: Successful T7 biopsy, radiofrequency ablation, and kyphoplasty as described above. Electronically Signed   By: Marybelle Killings M.D.   On: 01/30/2018 16:45   Ir Kypho Thoracic With Bone Biopsy  Result Date: 01/30/2018 INDICATION: T7 pathologic fracture.  Lung cancer.  EXAM: T7 ABLATION AND KYPHOPLASTY COMPARISON:  01/12/2018 MEDICATIONS: Ancef 2 g; The antibiotic was administered in an appropriate time interval prior to needle puncture of the skin. ANESTHESIA/SEDATION: General - as administered by the Anesthesia department Moderate Sedation Time:  59 minutes The patient was continuously monitored during the procedure by the interventional radiology nurse under my direct supervision. FLUOROSCOPY TIME:  Fluoroscopy Time: 12 minutes 0 seconds (165 mGy). COMPLICATIONS: None immediate. TECHNIQUE: Informed written consent was obtained from the patient after a thorough discussion of the procedural risks, benefits and alternatives. All questions were addressed. Maximal Sterile Barrier Technique was utilized including caps, mask, sterile gowns, sterile gloves, sterile drape,  hand hygiene and skin antiseptic. A timeout was performed prior to the initiation of the procedure. In the prone position, the back was prepped and draped in a sterile fashion and lidocaine was utilized for local anesthesia. Under fluoroscopic guidance, a 10 gauge needle was inserted into the T7 vertebral body via right pedicle approach. A cement trocar was then inserted for bone biopsy. The measuring drill was then advanced into the vertebral body yielding a measurements suitable for a 15 mm ablation probe. The identical procedure was performed for the left pedicle. The radiofrequency ablation probes were then inserted into both cannula needles and positioned in the T7 vertebral body. The TIPS are positioned in close proximity to each other. Ablation was successfully performed for 15 minutes. The probes were removed. Fifteen 2 balloons were inserted into each cannula needles and insufflated to 200 pounds per square inch. The needles were removed and cement was deposited through each pedicle utilizing the cement delivery system and cement trocar needles. The trocar needles and outer cannula needles were removed. There  is no extraosseous cement. FINDINGS: Imaging documents needle placement in the T7 vertebral body via bipedicular approach Subsequent imaging demonstrates drill placement in the vertebral bodies. Next series of images demonstrate placement of radiofrequency probes within the T7 vertebral body and a by pedicle approach. Subsequent imaging documents by pedicle kyphoplasty Final imaging demonstrates cement filling the vertebral body and crossing the midline without extraosseous cement. IMPRESSION: Successful T7 biopsy, radiofrequency ablation, and kyphoplasty as described above. Electronically Signed   By: Marybelle Killings M.D.   On: 01/30/2018 16:45   Ir Radiologist Eval & Mgmt  Result Date: 01/23/2018 Please refer to notes tab for details about interventional procedure. (Op Note)   Impression/Plan: 1. Stage IV, NSCLC, adenocarcinoma of the RLL with brain and spine metastases.  After discussing her case and we reviewing her films and her clinical situation with Dr. Lisbeth Renshaw, we will proceed with treatment as well with SRS approach to T11 extending into the T12 vertebral body as she has a lesion at the spinous process which appears to be the site of her pain on examination.  We also discussed continuing plans to treat T7 and we will ask for T10-T12 to be imaged during her upcoming SRS planning MRI this weekend.  She will continue systemic therapy and is currently receiving immunotherapy with Dr. Julien Nordmann.  We will follow this expectantly, she will also be followed in our brain oncology program, she will be due for a brain MRI scan in September. 2. Left shoulder pain.  Dr. Lisbeth Renshaw and I rereviewed her films, which were CT based. It does not appear that there is any visible lesion associated with her cancer in the humerus or glenoid/acromion sites though this was performed about a month ago.  We will proceed with an MRI of the left shoulder, and try to coordinate this at the time of her MRI scan for planning purposes this  weekend.  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  She was also counseled on the use of Tylenol as she is unable to tolerate Advil or ibuprofen products.  She was also encouraged to try icing this area as she had tried previously to use heat without significant improvement after further questioning.     Carola Rhine, PAC

## 2018-02-08 ENCOUNTER — Telehealth: Payer: Self-pay | Admitting: Radiation Therapy

## 2018-02-08 ENCOUNTER — Other Ambulatory Visit: Payer: Self-pay | Admitting: Radiation Oncology

## 2018-02-08 MED ORDER — OXYCODONE HCL 5 MG PO TABA: 1.0000 | ORAL_TABLET | Freq: Four times a day (QID) | ORAL | 0 refills | Status: DC | PRN

## 2018-02-08 MED ORDER — OXYCODONE HCL 5 MG PO TABS: 5.0000 mg | ORAL_TABLET | Freq: Four times a day (QID) | ORAL | 0 refills | Status: DC | PRN

## 2018-02-08 NOTE — Telephone Encounter (Signed)
Jill [name] called about the recent pain Rx she was given for OXAYDO. The pharmacy she normally goes to, [facility] [place], did not have this medication, so [facility] [address]. Then she called back to say that the cost of the medication is over $1000, so she is not able to get it. [name] asked if the RX could be sent back to the [facility] [place], but to use Oxycodone rather than OXAYDO.   Mont Dutton R.T.(R)(T) Special Procedures Navigator

## 2018-02-11 ENCOUNTER — Other Ambulatory Visit: Payer: Medicare Other

## 2018-02-11 ENCOUNTER — Inpatient Hospital Stay: Admission: RE | Admit: 2018-02-11 | Payer: Medicare Other | Source: Ambulatory Visit

## 2018-02-12 ENCOUNTER — Ambulatory Visit: Payer: Medicare Other | Admitting: Radiation Oncology

## 2018-02-12 ENCOUNTER — Inpatient Hospital Stay: Admission: RE | Admit: 2018-02-12 | Payer: Self-pay | Source: Ambulatory Visit | Admitting: Radiation Oncology

## 2018-02-14 ENCOUNTER — Other Ambulatory Visit: Payer: Self-pay | Admitting: Radiation Therapy

## 2018-02-15 ENCOUNTER — Ambulatory Visit: Admission: RE | Admit: 2018-02-15 | Payer: Medicare Other | Source: Ambulatory Visit | Admitting: Radiation Oncology

## 2018-02-15 ENCOUNTER — Ambulatory Visit
Admission: RE | Admit: 2018-02-15 | Discharge: 2018-02-15 | Disposition: A | Discharge status: Home / Self Care | Payer: Medicare Other | Source: Ambulatory Visit | Attending: Radiation Oncology | Admitting: Radiation Oncology

## 2018-02-15 ENCOUNTER — Ambulatory Visit: Payer: Medicare Other | Admitting: Radiation Oncology

## 2018-02-15 DIAGNOSIS — C7951 Secondary malignant neoplasm of bone: Secondary | ICD-10-CM

## 2018-02-16 ENCOUNTER — Telehealth: Payer: Self-pay | Admitting: Radiation Therapy

## 2018-02-16 NOTE — Telephone Encounter (Signed)
I spoke with Jill [name] about her shoulder MRI report. We discussed that there is a bony met in the head of her humerus causing the pain in her shoulder. We are able to treat this with a course of radiation and plan to do the simulation at the same time as her [facility] on Tuesday 8/20. Jill [name] was sad to hear the news, but glad to know that it can be treated.   We also discussed the fact that she has not started systemic treatment yet and how important it is that this is started as soon as possible. She has delayed this due to pain, but unfortunately she will not see relief from this pain or start to get control of her disease until after she is treated. Based on our discussion, Jill [name] is determined to not miss any other Med Onc appointments and to get started with her treatment on 8/22 as scheduled.    Mont Dutton R.T.(R)(T) Special Procedures Navigator

## 2018-02-18 ENCOUNTER — Other Ambulatory Visit: Payer: Medicare Other

## 2018-02-19 ENCOUNTER — Telehealth: Payer: Self-pay | Admitting: Radiation Oncology

## 2018-02-19 ENCOUNTER — Telehealth: Payer: Self-pay | Admitting: Medical Oncology

## 2018-02-19 ENCOUNTER — Other Ambulatory Visit: Payer: Self-pay | Admitting: Medical

## 2018-02-19 ENCOUNTER — Telehealth: Payer: Self-pay | Admitting: Medical

## 2018-02-19 ENCOUNTER — Inpatient Hospital Stay: Payer: Medicare Other

## 2018-02-19 DIAGNOSIS — C7951 Secondary malignant neoplasm of bone: Secondary | ICD-10-CM | POA: Insufficient documentation

## 2018-02-19 DIAGNOSIS — Z79899 Other long term (current) drug therapy: Secondary | ICD-10-CM | POA: Insufficient documentation

## 2018-02-19 DIAGNOSIS — Z809 Family history of malignant neoplasm, unspecified: Secondary | ICD-10-CM | POA: Insufficient documentation

## 2018-02-19 DIAGNOSIS — F209 Schizophrenia, unspecified: Secondary | ICD-10-CM | POA: Insufficient documentation

## 2018-02-19 DIAGNOSIS — C3491 Malignant neoplasm of unspecified part of right bronchus or lung: Secondary | ICD-10-CM | POA: Insufficient documentation

## 2018-02-19 DIAGNOSIS — E860 Dehydration: Secondary | ICD-10-CM

## 2018-02-19 DIAGNOSIS — I951 Orthostatic hypotension: Secondary | ICD-10-CM | POA: Insufficient documentation

## 2018-02-19 DIAGNOSIS — I10 Essential (primary) hypertension: Secondary | ICD-10-CM | POA: Insufficient documentation

## 2018-02-19 DIAGNOSIS — G893 Neoplasm related pain (acute) (chronic): Secondary | ICD-10-CM | POA: Insufficient documentation

## 2018-02-19 DIAGNOSIS — C7931 Secondary malignant neoplasm of brain: Secondary | ICD-10-CM | POA: Insufficient documentation

## 2018-02-19 DIAGNOSIS — F17210 Nicotine dependence, cigarettes, uncomplicated: Secondary | ICD-10-CM | POA: Insufficient documentation

## 2018-02-19 DIAGNOSIS — Z5112 Encounter for antineoplastic immunotherapy: Secondary | ICD-10-CM | POA: Insufficient documentation

## 2018-02-19 DIAGNOSIS — C3431 Malignant neoplasm of lower lobe, right bronchus or lung: Secondary | ICD-10-CM | POA: Insufficient documentation

## 2018-02-19 NOTE — Telephone Encounter (Signed)
Left message for patient regarding upcoming aug appts per 8/19 sch message.

## 2018-02-19 NOTE — Telephone Encounter (Signed)
LM for brother Coralyn Mark to call us back as well.

## 2018-02-19 NOTE — Telephone Encounter (Signed)
LM for pt to call us back.

## 2018-02-19 NOTE — Telephone Encounter (Signed)
Pt called and wants [facility] appt later closer to xrt. She does not think she needs IVF , she is pushing po fluids.  Since then she has been drinking water, green tea and broth. She says she is not nauseated. She reports she vomited yesterday 5 am and she vomited only coffee.Today , she kept down yogurt , 1/2 bowl oatmeal , 3 pieces chicken, bread and pinto beans.. She has not taken any antiemetic today. Lab and [facility] Appt moved closer to XRT.

## 2018-02-20 ENCOUNTER — Ambulatory Visit: Payer: Medicare Other | Admitting: Radiation Oncology

## 2018-02-20 ENCOUNTER — Inpatient Hospital Stay: Payer: Medicare Other

## 2018-02-20 ENCOUNTER — Other Ambulatory Visit: Payer: Medicare Other

## 2018-02-20 ENCOUNTER — Other Ambulatory Visit: Payer: Self-pay | Admitting: Radiation Oncology

## 2018-02-20 ENCOUNTER — Encounter: Payer: Medicare Other | Admitting: Medical

## 2018-02-20 ENCOUNTER — Inpatient Hospital Stay: Payer: Medicare Other | Attending: Internal Medicine | Admitting: Medical

## 2018-02-20 ENCOUNTER — Ambulatory Visit
Admission: RE | Admit: 2018-02-20 | Discharge: 2018-02-20 | Disposition: A | Discharge status: Home / Self Care | Payer: Medicare Other | Source: Ambulatory Visit | Attending: Radiation Oncology | Admitting: Radiation Oncology

## 2018-02-20 ENCOUNTER — Ambulatory Visit: Payer: Medicare Other | Admitting: Internal Medicine

## 2018-02-20 ENCOUNTER — Ambulatory Visit: Payer: Medicare Other

## 2018-02-20 VITALS — BP 90/73 | HR 134 | Temp 97.6°F | Resp 18 | Ht 61.0 in | Wt 153.6 lb

## 2018-02-20 DIAGNOSIS — F209 Schizophrenia, unspecified: Secondary | ICD-10-CM

## 2018-02-20 DIAGNOSIS — Z7189 Other specified counseling: Secondary | ICD-10-CM

## 2018-02-20 DIAGNOSIS — G893 Neoplasm related pain (acute) (chronic): Secondary | ICD-10-CM

## 2018-02-20 DIAGNOSIS — C7951 Secondary malignant neoplasm of bone: Secondary | ICD-10-CM | POA: Diagnosis present

## 2018-02-20 DIAGNOSIS — Z510 Encounter for antineoplastic radiation therapy: Secondary | ICD-10-CM | POA: Insufficient documentation

## 2018-02-20 DIAGNOSIS — I10 Essential (primary) hypertension: Secondary | ICD-10-CM

## 2018-02-20 DIAGNOSIS — Z515 Encounter for palliative care: Secondary | ICD-10-CM | POA: Diagnosis not present

## 2018-02-20 DIAGNOSIS — C7931 Secondary malignant neoplasm of brain: Secondary | ICD-10-CM | POA: Diagnosis not present

## 2018-02-20 DIAGNOSIS — C7800 Secondary malignant neoplasm of unspecified lung: Secondary | ICD-10-CM | POA: Diagnosis not present

## 2018-02-20 DIAGNOSIS — C3491 Malignant neoplasm of unspecified part of right bronchus or lung: Secondary | ICD-10-CM | POA: Diagnosis not present

## 2018-02-20 DIAGNOSIS — E860 Dehydration: Secondary | ICD-10-CM

## 2018-02-20 DIAGNOSIS — I951 Orthostatic hypotension: Secondary | ICD-10-CM | POA: Diagnosis not present

## 2018-02-20 DIAGNOSIS — Z809 Family history of malignant neoplasm, unspecified: Secondary | ICD-10-CM

## 2018-02-20 DIAGNOSIS — C3431 Malignant neoplasm of lower lobe, right bronchus or lung: Secondary | ICD-10-CM | POA: Insufficient documentation

## 2018-02-20 DIAGNOSIS — Z5112 Encounter for antineoplastic immunotherapy: Secondary | ICD-10-CM

## 2018-02-20 DIAGNOSIS — F17210 Nicotine dependence, cigarettes, uncomplicated: Secondary | ICD-10-CM | POA: Diagnosis not present

## 2018-02-20 DIAGNOSIS — Z87891 Personal history of nicotine dependence: Secondary | ICD-10-CM

## 2018-02-20 DIAGNOSIS — Z79899 Other long term (current) drug therapy: Secondary | ICD-10-CM | POA: Diagnosis not present

## 2018-02-20 LAB — CBC WITH DIFFERENTIAL (CANCER CENTER ONLY)
BASOS ABS: 0 10*3/uL (ref 0.0–0.1)
Basophils Relative: 0 %
EOS PCT: 1 %
Eosinophils Absolute: 0.1 10*3/uL (ref 0.0–0.5)
HEMATOCRIT: 39.9 % (ref 34.8–46.6)
Hemoglobin: 13.8 g/dL (ref 11.6–15.9)
LYMPHS PCT: 9 %
Lymphs Abs: 0.7 10*3/uL — ABNORMAL LOW (ref 0.9–3.3)
MCH: 29.5 pg (ref 25.1–34.0)
MCHC: 34.6 g/dL (ref 31.5–36.0)
MCV: 85.3 fL (ref 79.5–101.0)
MONO ABS: 0.5 10*3/uL (ref 0.1–0.9)
MONOS PCT: 6 %
NEUTROS ABS: 6.8 10*3/uL — AB (ref 1.5–6.5)
Neutrophils Relative %: 84 %
Platelet Count: 224 10*3/uL (ref 145–400)
RBC: 4.68 MIL/uL (ref 3.70–5.45)
RDW: 16.3 % — AB (ref 11.2–14.5)
WBC Count: 8.1 10*3/uL (ref 3.9–10.3)

## 2018-02-20 LAB — CMP (CANCER CENTER ONLY)
ALT: 11 U/L (ref 0–44)
ANION GAP: 10 (ref 5–15)
AST: 13 U/L — AB (ref 15–41)
Albumin: 4.2 g/dL (ref 3.5–5.0)
Alkaline Phosphatase: 153 U/L — ABNORMAL HIGH (ref 38–126)
BILIRUBIN TOTAL: 0.8 mg/dL (ref 0.3–1.2)
BUN: 14 mg/dL (ref 6–20)
CHLORIDE: 96 mmol/L — AB (ref 98–111)
CO2: 24 mmol/L (ref 22–32)
Calcium: 10.9 mg/dL — ABNORMAL HIGH (ref 8.9–10.3)
Creatinine: 0.69 mg/dL (ref 0.44–1.00)
Glucose, Bld: 84 mg/dL (ref 70–99)
POTASSIUM: 3.7 mmol/L (ref 3.5–5.1)
Sodium: 130 mmol/L — ABNORMAL LOW (ref 135–145)
TOTAL PROTEIN: 7.4 g/dL (ref 6.5–8.1)

## 2018-02-20 MED ORDER — OXYCODONE-ACETAMINOPHEN 5-325 MG PO TABS
ORAL_TABLET | ORAL | Status: AC
  Filled 2018-02-20: qty 1

## 2018-02-20 MED ORDER — OXYCODONE-ACETAMINOPHEN 5-325 MG PO TABS
1.0000 | ORAL_TABLET | Freq: Once | ORAL | Status: AC
  Administered 2018-02-20: 1 via ORAL
  Filled 2018-02-20: qty 1

## 2018-02-20 MED ORDER — FENTANYL 25 MCG/HR TD PT72: 25.0000 ug | MEDICATED_PATCH | TRANSDERMAL | 0 refills | Status: DC

## 2018-02-20 NOTE — Progress Notes (Signed)
Pt seen today during consent for treatment of left shoulder. She met with palliative care and recommendations have been made for long acting narcotic pain medication. She's done well in the past with Fentanyl transdermal at 25 mcg strength. A new rx was sent in for this and she will continue her oxycodone for breakthru relief.

## 2018-02-20 NOTE — Progress Notes (Signed)
  Radiation Oncology         [phone] ________________________________  Name: Jill [name] MRN: [id number]  Date: 02/20/2018  DOB: 1961-04-18  SIMULATION AND TREATMENT PLANNING NOTE  DIAGNOSIS:     ICD-10-CM   1. Bone metastasis (HCC) C79.51 oxyCODONE-acetaminophen (PERCOCET/ROXICET) 5-325 MG per tablet 1 tablet     Site: Left chest/shoulder  NARRATIVE:  The patient was brought to the [facility].  Identity was confirmed.  All relevant records and images related to the planned course of therapy were reviewed.   Written consent to proceed with treatment was confirmed which was freely given after reviewing the details related to the planned course of therapy had been reviewed with the patient.  Then, the patient was set-up in a stable reproducible  supine position for radiation therapy.  CT images were obtained.  Surface markings were placed.    Medically necessary complex treatment device(s) for immobilization: Customized Vac-Lok bag.   The CT images were loaded into the planning software.  Then the target and avoidance structures were contoured.  Treatment planning then occurred.  The radiation prescription was entered and confirmed.  A total of 2 complex treatment devices were fabricated which relate to the designed radiation treatment fields. Each of these customized fields/ complex treatment devices will be used on a daily basis during the radiation course. I have requested : Isodose Plan.   PLAN:  The patient will receive 30 Gy Gy in 10 fractions.  ________________________________   [name], MD, PhD

## 2018-02-20 NOTE — Consult Note (Signed)
Consultation Note Date: 02/20/2018   Patient Name: Jill [name]  DOB: 29-Oct-1960  MRN: [id number]  Age / Sex: 57 y.o., female  PCP: Aletha Halim., PA-C Referring Physician: Kyung Rudd, MD  Reason for Consultation: Establishing goals of care, Pain control and Psychosocial/spiritual support  HPI/Patient Profile: 58 y.o. 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 She was diagnosed in May 2019 and began palliative radiotherapy due to postobstructive changes.  Work-up was also significant for a lesion in the left parietal lobe of the brain. 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  She is had significant un-controlled pain and transportation issues which has hindered follow-up to some of her outpatient visits.  Patient faces treatment option decisions, advanced directive decisions, and anticipatory care needs.  Clinical Assessment and Goals of Care:  This nurse practitioner Wadie Lessen reviewed medical records, received report from team, assessed the patient and then met the patient along with her brother in the outpatient radiation oncology clinic to introduce the role of palliative medicine into a holistic treatment plan, offer emotional supports, and explore the patient's goals of care.  Patient verbalizes an understanding of the seriousness of her disease. Created space and opportunity today for Jill [name] to voice her thoughts and feeling regarding her current medical situation, and the emotional, financial and physical hurdles of living with life limiting disease.   Jill [name] has a dense psycho-social history.  A discussion was had today regarding advanced directives.  Concepts specific to code status,  artifical feeding and hydration, continued IV antibiotics and rehospitalization was had.  The difference between a aggressive medical intervention path  and a palliative comfort care path for this patient at this time was had.  Values and goals of care important to patient and family were attempted to be elicited.  MOST form introduced  Questions and concerns addressed.   Family encouraged to call with questions or concerns.    PMT will continue to support holistically.    NEXT OF KIN/Terry Whitner/brother    SUMMARY OF RECOMMENDATIONS    Code Status/Advance Care Planning:  Full code   Symptom Management Recommendations:   Pain: Fentanyl patch 25 mcg as directed                Oxycodone 5 mg p.o. every 6 hours prn                 Bowel prophylaxis     Primary Diagnoses: Present on Admission: **None**   I have reviewed the medical record, interviewed the patient and family, and examined the patient. The following aspects are pertinent.  Past Medical History:  Diagnosis Date  . Cancer ([place])   . Hypertension   . Schizophrenia [facility])    Social History   Socioeconomic History  . Marital status: Single    Spouse name: Not on file  .  Number of children: Not on file  . Years of education: Not on file  . Highest education level: Not on file  Occupational History  . Not on file  Social Needs  . Financial resource strain: Not on file  . Food insecurity:    Worry: Not on file    Inability: Not on file  . Transportation needs:    Medical: Not on file    Non-medical: Not on file  Tobacco Use  . Smoking status: Current Every Day Smoker    Packs/day: 2.00    Years: 36.00    Pack years: 72.00    Types: Cigarettes    Start date: 10/31/1981  . Smokeless tobacco: Never Used  . Tobacco comment: HAS CALLED QUIT 4 FREE ALREADY  Substance and Sexual Activity  . Alcohol use: Never    Frequency: Never  . Drug use: Never  . Sexual activity: Not on file  Lifestyle  .  Physical activity:    Days per week: Not on file    Minutes per session: Not on file  . Stress: Not on file  Relationships  . Social connections:    Talks on phone: Not on file    Gets together: Not on file    Attends religious service: Not on file    Active member of club or organization: Not on file    Attends meetings of clubs or organizations: Not on file    Relationship status: Not on file  Other Topics Concern  . Not on file  Social History Narrative  . Not on file   Family History  Problem Relation Age of Onset  . Cancer Mother    Scheduled Meds: Continuous Infusions: PRN Meds:. Medications Prior to Admission:  Prior to Admission medications   Medication Sig Start Date End Date Taking? Authorizing Provider  acetaminophen (TYLENOL) 500 MG tablet Take 1,000 mg by mouth every 6 (six) hours as needed for moderate pain.     [provider]  amitriptyline (ELAVIL) 50 MG tablet Take 1 tablet (50 mg total) by mouth at bedtime. Patient not taking: Reported on 02/07/2018 11/21/17   Ventura Sellers, MD  benzonatate (TESSALON) 100 MG capsule Take 100 mg by mouth 3 (three) times daily as needed for cough.    [provider]  dexamethasone (DECADRON) 2 MG tablet Take 1 tablet (2 mg total) by mouth daily. Patient not taking: Reported on 02/07/2018 12/07/17   Ventura Sellers, MD  fentaNYL (DURAGESIC - DOSED MCG/HR) 25 MCG/HR patch Place 1 patch (25 mcg total) onto the skin every 3 (three) days. 02/20/18   Hayden Pedro, PA-C  gabapentin (NEURONTIN) 100 MG capsule Take 100 mg by mouth daily as needed (pain).  12/27/17   [provider]  haloperidol (HALDOL) 5 MG tablet Take 5 mg by mouth 2 (two) times daily.    [provider]  ipratropium-albuterol (DUONEB) 0.5-2.5 (3) MG/3ML SOLN Take 3 mLs by nebulization every 4 (four) hours as needed. Patient not taking: Reported on 02/07/2018 11/18/17   Kayleen Memos, DO  lisinopril-hydrochlorothiazide  (PRINZIDE,ZESTORETIC) 20-25 MG tablet Take 1 tablet by mouth daily.    [provider]  nicotine (NICODERM CQ - DOSED IN MG/24 HOURS) 14 mg/24hr patch Place 14 mg onto the skin daily.     [provider]  ondansetron (ZOFRAN) 8 MG tablet Take 1 tablet (8 mg total) by mouth every 8 (eight) hours as needed for nausea or vomiting. Patient not taking: Reported on  02/07/2018 12/06/17   Vaslow, Zachary K, MD  oxyCODONE (OXY IR/ROXICODONE) 5 MG immediate release tablet Take 1 tablet (5 mg total) by mouth every 6 (six) hours as needed for severe pain. 02/08/18   Perkins, Alison Claire, PA-C  polyethylene glycol (MIRALAX / GLYCOLAX) packet MIX AND DRINK 1 PACKET TWICE DAILY Patient not taking: Reported on 02/20/2018 12/08/17   Moody, John, MD  rosuvastatin (CRESTOR) 20 MG tablet Take 20 mg by mouth every evening.     [provider]  senna-docusate (SENOKOT-S) 8.6-50 MG tablet Take 1 tablet by mouth 2 (two) times daily. Patient not taking: Reported on 02/20/2018 11/18/17   Hall, Carole N, DO   Allergies  Allergen Reactions  . Ibuprofen Nausea And Vomiting  . Morphine And Related     Headaches, Vomiting  . Prednisone     Dizziness and pain   Review of Systems  Constitutional: Positive for fatigue.  Musculoskeletal:       -lower back , left shoulder and rib pain---rated 8 /10  Neurological: Positive for weakness.    Physical Exam  Constitutional: She is oriented to person, place, and time. She appears well-developed.  Neurological: She is alert and oriented to person, place, and time.  Skin: Skin is warm and dry.  Psychiatric: She has a normal mood and affect.    Vital Signs: There were no vitals taken for this visit.         SpO2:   O2 Device:  O2 Flow Rate: .   IO: Intake/output summary: No intake or output data in the 24 hours ending 02/20/18 1423  LBM:   Baseline Weight:   Most recent weight:       Palliative Assessment/Data: 70%   Discussed with Allison  Shields PA-C and Lauren Somers LCSW with [facility]  Time In: 1245 Time Out: 1400 Time Total: 75 minutes Greater than 50%  of this time was spent counseling and coordinating care related to the above assessment and plan.  Signed by:  , NP   Please contact Palliative Medicine Team phone at [phone] for questions and concerns.  For individual provider: See Amion             

## 2018-02-20 NOTE — Patient Instructions (Signed)

## 2018-02-21 DIAGNOSIS — Z510 Encounter for antineoplastic radiation therapy: Secondary | ICD-10-CM | POA: Diagnosis not present

## 2018-02-22 ENCOUNTER — Telehealth: Payer: Self-pay | Admitting: Internal Medicine

## 2018-02-22 ENCOUNTER — Encounter: Payer: Self-pay | Admitting: Internal Medicine

## 2018-02-22 ENCOUNTER — Inpatient Hospital Stay: Payer: Medicare Other | Admitting: Nutrition

## 2018-02-22 ENCOUNTER — Inpatient Hospital Stay: Payer: Medicare Other

## 2018-02-22 ENCOUNTER — Other Ambulatory Visit: Payer: Self-pay | Admitting: Medical Oncology

## 2018-02-22 ENCOUNTER — Ambulatory Visit: Payer: Medicare Other | Admitting: Radiation Oncology

## 2018-02-22 ENCOUNTER — Inpatient Hospital Stay (HOSPITAL_BASED_OUTPATIENT_CLINIC_OR_DEPARTMENT_OTHER): Payer: Medicare Other | Admitting: Internal Medicine

## 2018-02-22 VITALS — BP 116/82 | HR 110 | Temp 97.8°F | Resp 18 | Ht 61.0 in | Wt 152.5 lb

## 2018-02-22 DIAGNOSIS — C7951 Secondary malignant neoplasm of bone: Secondary | ICD-10-CM

## 2018-02-22 DIAGNOSIS — C3431 Malignant neoplasm of lower lobe, right bronchus or lung: Secondary | ICD-10-CM | POA: Diagnosis not present

## 2018-02-22 DIAGNOSIS — Z5112 Encounter for antineoplastic immunotherapy: Secondary | ICD-10-CM | POA: Diagnosis not present

## 2018-02-22 DIAGNOSIS — C7931 Secondary malignant neoplasm of brain: Secondary | ICD-10-CM

## 2018-02-22 DIAGNOSIS — Z7189 Other specified counseling: Secondary | ICD-10-CM

## 2018-02-22 DIAGNOSIS — C3491 Malignant neoplasm of unspecified part of right bronchus or lung: Secondary | ICD-10-CM

## 2018-02-22 DIAGNOSIS — G893 Neoplasm related pain (acute) (chronic): Secondary | ICD-10-CM

## 2018-02-22 DIAGNOSIS — Z79899 Other long term (current) drug therapy: Secondary | ICD-10-CM

## 2018-02-22 DIAGNOSIS — I951 Orthostatic hypotension: Secondary | ICD-10-CM

## 2018-02-22 DIAGNOSIS — F17210 Nicotine dependence, cigarettes, uncomplicated: Secondary | ICD-10-CM

## 2018-02-22 DIAGNOSIS — Z809 Family history of malignant neoplasm, unspecified: Secondary | ICD-10-CM

## 2018-02-22 DIAGNOSIS — F209 Schizophrenia, unspecified: Secondary | ICD-10-CM

## 2018-02-22 DIAGNOSIS — F17200 Nicotine dependence, unspecified, uncomplicated: Secondary | ICD-10-CM

## 2018-02-22 DIAGNOSIS — I10 Essential (primary) hypertension: Secondary | ICD-10-CM

## 2018-02-22 MED ORDER — SODIUM CHLORIDE 0.9% FLUSH
10.0000 mL | INTRAVENOUS | Status: DC | PRN
  Administered 2018-02-22: 10 mL
  Filled 2018-02-22: qty 10

## 2018-02-22 MED ORDER — ONDANSETRON HCL 4 MG/2ML IJ SOLN
INTRAMUSCULAR | Status: AC
  Filled 2018-02-22: qty 4

## 2018-02-22 MED ORDER — HEPARIN SOD (PORK) LOCK FLUSH 100 UNIT/ML IV SOLN
250.0000 [IU] | Freq: Once | INTRAVENOUS | Status: AC | PRN
  Administered 2018-02-22: 250 [IU]
  Filled 2018-02-22: qty 5

## 2018-02-22 MED ORDER — SODIUM CHLORIDE 0.9 % IV SOLN
Freq: Once | INTRAVENOUS | Status: AC
  Administered 2018-02-22: 09:00:00 via INTRAVENOUS
  Filled 2018-02-22: qty 250

## 2018-02-22 MED ORDER — SODIUM CHLORIDE 0.9 % IV SOLN: Freq: Once | INTRAVENOUS | Status: DC

## 2018-02-22 MED ORDER — ONDANSETRON HCL 4 MG/2ML IJ SOLN
8.0000 mg | Freq: Once | INTRAMUSCULAR | Status: AC
  Administered 2018-02-22: 8 mg via INTRAVENOUS

## 2018-02-22 MED ORDER — SODIUM CHLORIDE 0.9 % IV SOLN
200.0000 mg | Freq: Once | INTRAVENOUS | Status: AC
  Administered 2018-02-22: 200 mg via INTRAVENOUS
  Filled 2018-02-22: qty 8

## 2018-02-22 NOTE — Telephone Encounter (Signed)
Scheduled appt per 8/22 los - gave patient aVS and calender per los

## 2018-02-22 NOTE — Progress Notes (Signed)
[facility] Progress Note   Jill [name] [id number] 01/10/1961 57 y.o.  Jill [name] is managed by Dr. Fanny [name]. Jill [name]  Actively treated with chemotherapy/immunotherapy: no  Assessment: Plan:    Metastatic lung cancer (metastasis from lung to other site), right (HCC)  Orthostatic hypotension  Bone metastasis ([place])  Neoplasm related pain  Hypercalcemia   Metastatic lung cancer: The patient is currently receiving radiation therapy.  She is scheduled to see Dr. Julien [name] in follow-up on 03/15/2018.  Orthostatic hypotension.  The patient was offered 1 L of normal saline today which she declined.  The patient has been instructed to push fluids.  Neoplasm related pain: The patient had been given a prescription for Percocet by Jill Flank, PA-C in radiation oncology.  She request a refill of Jill [name] patches today.  I have asked that she make this request to Jill [name] when she is seen later today.  Hypercalcemia.  The patient's labs returned with a calcium level of 10.9.  The patient has been instructed to push fluids.  I will discuss with Dr. Julien [name] whether he wishes to have the patient return earlier than her scheduled appointment for IV fluids or Zometa.  Please see After Visit Summary for patient specific instructions.  Future Appointments  Date Time Provider [place]  02/23/2018  2:50 PM [facility] [id number] [facility] None  02/26/2018  3:55 PM [facility] [id number] [facility] None  02/27/2018 12:40 PM [facility] [id number] [facility] None  02/28/2018 12:40 PM [facility] [id number] [facility] None  03/01/2018  3:15 PM [facility] LINAC 3 [facility] None  03/02/2018  3:15 PM [facility] LINAC 3 [facility] None  03/06/2018  4:00 PM [facility] LINAC 3 [facility] None  03/07/2018  4:15 PM [facility] LINAC 3 [facility] None  03/08/2018  3:30 PM [facility] [id number] [facility] None  03/09/2018  9:40 AM Jill [name], Jill Lav, MD [facility]  None  03/15/2018  1:15 PM [facility] LAB 5 [facility] None  03/15/2018  1:45 PM Jill Shark, NP [facility] None  03/15/2018  2:30 PM [facility] INFUSION [facility] None  04/05/2018  9:00 AM [facility] LAB 4 [facility] None  04/05/2018  9:30 AM Curt Bears, MD [facility] None  04/05/2018  9:45 AM [facility] INFUSION [facility] None  04/26/2018  9:30 AM [facility] LAB 4 [facility] None  04/26/2018 10:00 AM Curt Bears, MD [facility] None  04/26/2018 11:00 AM [facility] INFUSION [facility] None    No orders of the defined types were placed in this encounter.      Subjective:   Patient ID:  Jill [name] is a 57 y.o. (DOB 11/20/1960) female.  Chief Complaint:  Chief Complaint  Patient presents with  . Back Pain    HPI Jill [name] is a 57 year old female with a diagnosis of a stage IV (T2b, N2, M1c) non-small cell lung cancer, poorly differentiated adenocarcinoma who has been seen previously by Dr. Fanny [name]. Jill [name] and is currently undergoing radiation therapy.  She presents to the clinic today per request of Jill Flank, PA-C who had asked that the patient be seen for consideration of IV fluids.  The patient states that she has been drinking plenty of water, green tea, and broth.  She did have vomiting yesterday morning after having coffee.  She reports having lower back pain especially on her right side.  She describes this pain as being intense.  She had been given a prescription for Percocet by Jill [name] in radiation oncology.  She request a refill of Jill [name] today.  She denies any radiation of  her pain, constipation, nausea, vomiting, fevers, chills, shortness of breath, or chest pain.  She reports that she is having some difficulty sleeping.  She is walking with a cane.  She declines having any IV fluids today despite her orthostasis.  She states that she is having difficulty sitting due to the pain in her back and would rather proceed with her  radiation therapy and then go home.  Medications: I have reviewed the patient's current medications.  Allergies:  Allergies  Allergen Reactions  . Ibuprofen Nausea And Vomiting  . Morphine And Related     Headaches, Vomiting  . Prednisone     Dizziness and pain    Past Medical History:  Diagnosis Date  . Cancer ([place])   . Hypertension   . Schizophrenia [facility])     Past Surgical History:  Procedure Laterality Date  . HERNIA REPAIR    . IR BONE TUMOR(S)RF ABLATION  01/30/2018  . IR KYPHO THORACIC WITH BONE BIOPSY  01/30/2018  . IR RADIOLOGIST EVAL & MGMT  01/23/2018  . KIDNEY SURGERY    . RADIOLOGY WITH ANESTHESIA N/A 01/12/2018   Procedure: MRI WITH ANESTHESIA;  Surgeon: Radiologist, Medication, MD;  Location: [facility];  Service: Radiology;  Laterality: N/A;  . VIDEO BRONCHOSCOPY Bilateral 11/14/2017   Procedure: VIDEO BRONCHOSCOPY WITH FLUORO;  Surgeon: Chesley Mires, MD;  Location: [facility];  Service: Endoscopy;  Laterality: Bilateral;    Family History  Problem Relation Age of Onset  . Cancer Mother     Social History   Socioeconomic History  . Marital status: Single    Spouse name: Not on file  . Number of children: Not on file  . Years of education: Not on file  . Highest education level: Not on file  Occupational History  . Not on file  Social Needs  . Financial resource strain: Not on file  . Food insecurity:    Worry: Not on file    Inability: Not on file  . Transportation needs:    Medical: Not on file    Non-medical: Not on file  Tobacco Use  . Smoking status: Former Smoker    Packs/day: 2.00    Years: 36.00    Pack years: 72.00    Types: Cigarettes    Start date: 10/31/1981    Last attempt to quit: 11/27/2017    Years since quitting: 0.2  . Smokeless tobacco: Never Used  . Tobacco comment: HAS CALLED QUIT 4 FREE ALREADY  Substance and Sexual Activity  . Alcohol use: Never    Frequency: Never  . Drug use: Never  . Sexual activity: Not on file    Lifestyle  . Physical activity:    Days per week: Not on file    Minutes per session: Not on file  . Stress: Not on file  Relationships  . Social connections:    Talks on phone: Not on file    Gets together: Not on file    Attends religious service: Not on file    Active member of club or organization: Not on file    Attends meetings of clubs or organizations: Not on file    Relationship status: Not on file  . Intimate partner violence:    Fear of current or ex partner: Not on file    Emotionally abused: Not on file    Physically abused: Not on file    Forced sexual activity: Not on file  Other Topics Concern  . Not on file  Social History Narrative  . Not on file    Past Medical History, Surgical history, Social history, and Family history were reviewed and updated as appropriate.   Please see review of systems for further details on the patient's review from today.   Review of Systems:  Review of Systems  Constitutional: Negative for activity change, appetite change, chills, diaphoresis and fever.  HENT: Negative for trouble swallowing.   Respiratory: Negative for cough, chest tightness and shortness of breath.   Cardiovascular: Negative for chest pain, palpitations and leg swelling.  Gastrointestinal: Negative for abdominal distention, abdominal pain, constipation, diarrhea, nausea and vomiting.  Musculoskeletal: Positive for back pain.  Neurological: Negative for dizziness and headaches.    Objective:   Physical Exam:  BP 90/73 (BP Location: Left Arm) Comment: nurse aware of bp  Pulse (!) 134 Comment: nurse aware of pulse  Temp 97.6 F (36.4 C) (Oral)   Resp 18   Ht 5\' 1"  (1.549 m)   Wt 153 lb 9.6 oz (69.7 kg)   SpO2 99%   BMI 29.02 kg/m  ECOG: 1  Orthostatic Blood Pressure: Blood pressure:   sitting 89/69, standing 88/45 Pulse:   sitting 121, standing 136   Physical Exam  Constitutional: No distress.  HENT:  Head: Normocephalic and atraumatic.   Eyes: Right eye exhibits no discharge. Left eye exhibits no discharge. No scleral icterus.  Cardiovascular: Regular rhythm and normal heart sounds. Tachycardia present. Exam reveals no friction rub.  No murmur heard. Pulmonary/Chest: Effort normal and breath sounds normal. No stridor. No respiratory distress. She has no wheezes. She has no rales.  Neurological: She is alert. Coordination (The patient is ambulating with the use of a cane.) abnormal.  Skin: Skin is warm and dry. She is not diaphoretic. No erythema. No pallor.    Lab Review:     Component Value Date/Time   NA 130 (L) 02/20/2018 1111   K 3.7 02/20/2018 1111   CL 96 (L) 02/20/2018 1111   CO2 24 02/20/2018 1111   GLUCOSE 84 02/20/2018 1111   BUN 14 02/20/2018 1111   CREATININE 0.69 02/20/2018 1111   CALCIUM 10.9 (H) 02/20/2018 1111   PROT 7.4 02/20/2018 1111   ALBUMIN 4.2 02/20/2018 1111   AST 13 (L) 02/20/2018 1111   ALT 11 02/20/2018 1111   ALKPHOS 153 (H) 02/20/2018 1111   BILITOT 0.8 02/20/2018 1111   GFRNONAA >60 02/20/2018 1111   GFRAA >60 02/20/2018 1111       Component Value Date/Time   WBC 8.1 02/20/2018 1111   WBC 8.6 01/30/2018 0757   RBC 4.68 02/20/2018 1111   HGB 13.8 02/20/2018 1111   HCT 39.9 02/20/2018 1111   PLT 224 02/20/2018 1111   MCV 85.3 02/20/2018 1111   MCH 29.5 02/20/2018 1111   MCHC 34.6 02/20/2018 1111   RDW 16.3 (H) 02/20/2018 1111   LYMPHSABS 0.7 (L) 02/20/2018 1111   MONOABS 0.5 02/20/2018 1111   EOSABS 0.1 02/20/2018 1111   BASOSABS 0.0 02/20/2018 1111   -------------------------------  Imaging from last 24 hours (if applicable):  Radiology interpretation: Mr Shoulder Left Wo Contrast  Result Date: 02/15/2018 CLINICAL DATA:  Left shoulder pain for 2 months. History of metastatic lung carcinoma. EXAM: MRI OF THE LEFT SHOULDER WITHOUT CONTRAST TECHNIQUE: Multiplanar, multisequence MR imaging of the shoulder was performed. No intravenous contrast was administered.  COMPARISON:  MRI thoracic spine 01/12/2018.  CT chest 01/07/2018. FINDINGS: Rotator cuff:  Intact and normal in appearance. Muscles:  Normal without  atrophy or focal lesion. Biceps long head:  Intact and normal appearance. Acromioclavicular Joint: Minimal degenerative change is noted. Type II acromion. No subacromial/subdeltoid bursal fluid. Glenohumeral Joint: Appears normal. Labrum:  Intact. Bones: There is a lesion in the anterior aspect of the proximal humerus consistent with a metastatic deposit measuring 2.5 cm AP by 1.9 cm transverse by approximately 3.1 cm craniocaudal. Also seen is a destructive lesion in the left first rib with associated soft tissue nodule measuring approximately 2.7 cm transverse. This is incompletely imaged. Other: None. IMPRESSION: Lesions in the proximal humerus and left second rib consistent with metastatic deposits. Intact rotator cuff, long head of biceps and glenoid labrum. Minimal acromioclavicular osteoarthritis. Electronically Signed   By: Inge Rise M.D.   On: 02/15/2018 10:12   Ir Bone Tumor(s)rf Ablation  Result Date: 01/30/2018 INDICATION: T7 pathologic fracture.  Lung cancer. EXAM: T7 ABLATION AND KYPHOPLASTY COMPARISON:  01/12/2018 MEDICATIONS: Ancef 2 g; The antibiotic was administered in an appropriate time interval prior to needle puncture of the skin. ANESTHESIA/SEDATION: General - as administered by the Anesthesia department Moderate Sedation Time:  59 minutes The patient was continuously monitored during the procedure by the interventional radiology nurse under my direct supervision. FLUOROSCOPY TIME:  Fluoroscopy Time: 12 minutes 0 seconds (165 mGy). COMPLICATIONS: None immediate. TECHNIQUE: Informed written consent was obtained from the patient after a thorough discussion of the procedural risks, benefits and alternatives. All questions were addressed. Maximal Sterile Barrier Technique was utilized including caps, mask, sterile gowns, sterile gloves,  sterile drape, hand hygiene and skin antiseptic. A timeout was performed prior to the initiation of the procedure. In the prone position, the back was prepped and draped in a sterile fashion and lidocaine was utilized for local anesthesia. Under fluoroscopic guidance, a 10 gauge needle was inserted into the T7 vertebral body via right pedicle approach. A cement trocar was then inserted for bone biopsy. The measuring drill was then advanced into the vertebral body yielding a measurements suitable for a 15 mm ablation probe. The identical procedure was performed for the left pedicle. The radiofrequency ablation probes were then inserted into both cannula needles and positioned in the T7 vertebral body. The TIPS are positioned in close proximity to each other. Ablation was successfully performed for 15 minutes. The probes were removed. Fifteen 2 balloons were inserted into each cannula needles and insufflated to 200 pounds per square inch. The needles were removed and cement was deposited through each pedicle utilizing the cement delivery system and cement trocar needles. The trocar needles and outer cannula needles were removed. There is no extraosseous cement. FINDINGS: Imaging documents needle placement in the T7 vertebral body via bipedicular approach Subsequent imaging demonstrates drill placement in the vertebral bodies. Next series of images demonstrate placement of radiofrequency probes within the T7 vertebral body and a by pedicle approach. Subsequent imaging documents by pedicle kyphoplasty Final imaging demonstrates cement filling the vertebral body and crossing the midline without extraosseous cement. IMPRESSION: Successful T7 biopsy, radiofrequency ablation, and kyphoplasty as described above. Electronically Signed   By: Marybelle Killings M.D.   On: 01/30/2018 16:45   Ir Kypho Thoracic With Bone Biopsy  Result Date: 01/30/2018 INDICATION: T7 pathologic fracture.  Lung cancer. EXAM: T7 ABLATION AND  KYPHOPLASTY COMPARISON:  01/12/2018 MEDICATIONS: Ancef 2 g; The antibiotic was administered in an appropriate time interval prior to needle puncture of the skin. ANESTHESIA/SEDATION: General - as administered by the Anesthesia department Moderate Sedation Time:  59 minutes The patient was continuously  monitored during the procedure by the interventional radiology nurse under my direct supervision. FLUOROSCOPY TIME:  Fluoroscopy Time: 12 minutes 0 seconds (165 mGy). COMPLICATIONS: None immediate. TECHNIQUE: Informed written consent was obtained from the patient after a thorough discussion of the procedural risks, benefits and alternatives. All questions were addressed. Maximal Sterile Barrier Technique was utilized including caps, mask, sterile gowns, sterile gloves, sterile drape, hand hygiene and skin antiseptic. A timeout was performed prior to the initiation of the procedure. In the prone position, the back was prepped and draped in a sterile fashion and lidocaine was utilized for local anesthesia. Under fluoroscopic guidance, a 10 gauge needle was inserted into the T7 vertebral body via right pedicle approach. A cement trocar was then inserted for bone biopsy. The measuring drill was then advanced into the vertebral body yielding a measurements suitable for a 15 mm ablation probe. The identical procedure was performed for the left pedicle. The radiofrequency ablation probes were then inserted into both cannula needles and positioned in the T7 vertebral body. The TIPS are positioned in close proximity to each other. Ablation was successfully performed for 15 minutes. The probes were removed. Fifteen 2 balloons were inserted into each cannula needles and insufflated to 200 pounds per square inch. The needles were removed and cement was deposited through each pedicle utilizing the cement delivery system and cement trocar needles. The trocar needles and outer cannula needles were removed. There is no extraosseous  cement. FINDINGS: Imaging documents needle placement in the T7 vertebral body via bipedicular approach Subsequent imaging demonstrates drill placement in the vertebral bodies. Next series of images demonstrate placement of radiofrequency probes within the T7 vertebral body and a by pedicle approach. Subsequent imaging documents by pedicle kyphoplasty Final imaging demonstrates cement filling the vertebral body and crossing the midline without extraosseous cement. IMPRESSION: Successful T7 biopsy, radiofrequency ablation, and kyphoplasty as described above. Electronically Signed   By: Marybelle Killings M.D.   On: 01/30/2018 16:45

## 2018-02-22 NOTE — Patient Instructions (Signed)
[facility] Discharge Instructions for Patients Receiving Chemotherapy  Today you received the following chemotherapy agents Keytruda.  To help prevent nausea and vomiting after your treatment, we encourage you to take your nausea medication.   If you develop nausea and vomiting that is not controlled by your nausea medication, call the clinic.   BELOW ARE SYMPTOMS THAT SHOULD BE REPORTED IMMEDIATELY:  *FEVER GREATER THAN 100.5 F  *CHILLS WITH OR WITHOUT FEVER  NAUSEA AND VOMITING THAT IS NOT CONTROLLED WITH YOUR NAUSEA MEDICATION  *UNUSUAL SHORTNESS OF BREATH  *UNUSUAL BRUISING OR BLEEDING  TENDERNESS IN MOUTH AND THROAT WITH OR WITHOUT PRESENCE OF ULCERS  *URINARY PROBLEMS  *BOWEL PROBLEMS  UNUSUAL RASH Items with * indicate a potential emergency and should be followed up as soon as possible.  Feel free to call the clinic should you have any questions or concerns. The clinic phone number is [phone].  Please show the [facility] at check-in to the Emergency Department and triage nurse.

## 2018-02-22 NOTE — Progress Notes (Signed)
  Radiation Oncology         [phone] ________________________________  Name: Jill [name] MRN: [id number]  Date: 01/02/2018  DOB: [birth year]  SIMULATION AND TREATMENT PLANNING NOTE  DIAGNOSIS:     ICD-10-CM   1. Bone metastasis ([place]) C79.51     Site:  Spine at the L5 level  NARRATIVE:  The patient was brought to the [facility].  Identity was confirmed.  All relevant records and images related to the planned course of therapy were reviewed.   Written consent to proceed with treatment was confirmed which was freely given after reviewing the details related to the planned course of therapy had been reviewed with the patient.  Then, the patient was set-up in a stable reproducible supine position for radiation therapy.  CT images were obtained.  Surface markings were placed.    Medically necessary complex treatment device(s) for immobilization:  1.  customized body-fix device, 2.  customized accuform device.   The CT images were loaded into the planning software.  Then the target and avoidance structures were contoured.  Treatment planning then occurred.  The radiation prescription was entered and confirmed.   The patient will receive a course of stereotactic radiosurgery to the spine. I am therefore requesting a 3-D conformal technique. The target volume has been contoured in addition to the spinal cord and surrounding critical at risk organs.  Dose volume histograms of each of these structures will be carefully reviewed as part of the 3-D conformal technique.   PLAN:  The patient will receive 18 Gy in 1 fraction(s).  ________________________________   [name], MD, PhD

## 2018-02-22 NOTE — Progress Notes (Signed)
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  She is a patient of Dr. Julien Nordmann  Past medical history includes schizophrenia and hypertension.  Medications include Decadron, Zofran, MiraLAX, and Senokot-S.  Labs were reviewed.  Height: 5 feet 1 inch. Weight: 152.5 pounds August 22. Usual body weight: 175 pounds. BMI: 28.81.  Estimated nutrition needs: 1900-2200 cal, 75-90 g protein, 2.2 L fluid.  Patient reports she has nausea and vomiting. She is only been taking Zofran occasionally. She reports Zofran is effective. Patient has constipation but is working on a bowel regimen.  She is planning to increase MiraLAX and Senokot. Patient verbalized that she knows she needs to drink more water. Nutrition focused physical exam was deferred secondary to patient working with nursing at this time.  Nutrition diagnosis: Unintended weight loss related to metastatic lung cancer as evidenced by 13% weight loss from usual body weight. Severe malnutrition in acute illness illness secondary to 13% weight loss in less than 3 months and less than 75% energy intake for greater than 1 month.  Intervention: Educated patient on strategies for eating with nausea and vomiting. Recommended patient take nausea medication as written. Reviewed strategies for improving constipation. Recommended patient try oral nutrition supplements and provided samples.  Patient is agreeable to try [employer] breakfast essentials. Provided patient with fact sheets, answered questions, and provided contact information.  Monitoring, evaluation, goals: Patient will tolerate increased calories and protein to minimize further weight loss.  Next visit: To be scheduled as needed.  **Disclaimer: This note was dictated with voice recognition software. Similar sounding words can inadvertently be transcribed and this note may contain transcription errors which may not have been  corrected upon publication of note.**

## 2018-02-22 NOTE — Progress Notes (Signed)
[facility] [phone]   [phone]  OFFICE PROGRESS NOTE  Aletha Halim., PA-C [address] [place] [address]  DIAGNOSIS: stage IV (T2b, N2, M1c) non-small cell lung cancer, poorly differentiated adenocarcinoma.  This is diagnosed in May 2019 and presented with large right lower lobe lung mass in addition to mediastinal lymphadenopathy and metastatic disease to bones and brain.  Biomarker Findings Tumor Mutational Burden - TMB-High (21 Muts/Mb) Microsatellite status - MS-Stable Genomic Findings For a complete list of the genes assayed, please refer to the Appendix. KRAS G12D MITF C437F TERT promoter -124C>T TP53 E258Q 7 Disease relevant genes with no reportable alterations: EGFR, ALK, BRAF, MET, RET, ERBB2, ROS1  PDL1 expression 99%.  PRIOR THERAPY: Palliative radiotherapy to the solitary brain metastases in addition to the large right lower lobe lung mass under the care of Dr. Lisbeth Renshaw.  This is expected to be completed on Nov 30, 2017.  CURRENT THERAPY: First-line treatment with immunotherapy with Keytruda 200 mg IV every 3 weeks.  First dose February 22, 2018  INTERVAL HISTORY: Jill [name] 57 y.o. female returns to the clinic today for follow-up visit.  The patient was lost to follow-up for the last 2-3 months.  She underwent several procedures including palliative radiotherapy as well as vertebroplasty.  She was supposed to start her first dose of treatment with immunotherapy in early June 2019.  She denied having any chest pain, shortness of breath except with exertion but has cough with no hemoptysis.  She denied having any headache or visual changes.  She has no nausea, vomiting, diarrhea or constipation.  She has no recent weight loss.  She is here today for reevaluation and discussion of her treatment options.  MEDICAL HISTORY: Past Medical History:  Diagnosis Date  . Cancer ([place])   . Hypertension   . Schizophrenia  ([place])     ALLERGIES:  is allergic to ibuprofen; morphine and related; and prednisone.  MEDICATIONS:  Current Outpatient Medications  Medication Sig Dispense Refill  . acetaminophen (TYLENOL) 500 MG tablet Take 1,000 mg by mouth every 6 (six) hours as needed for moderate pain.     Marland Kitchen amitriptyline (ELAVIL) 50 MG tablet Take 1 tablet (50 mg total) by mouth at bedtime. (Patient not taking: Reported on 02/07/2018) 30 tablet 3  . benzonatate (TESSALON) 100 MG capsule Take 100 mg by mouth 3 (three) times daily as needed for cough.    . dexamethasone (DECADRON) 2 MG tablet Take 1 tablet (2 mg total) by mouth daily. (Patient not taking: Reported on 02/07/2018) 30 tablet 2  . fentaNYL (DURAGESIC - DOSED MCG/HR) 25 MCG/HR patch Place 1 patch (25 mcg total) onto the skin every 3 (three) days. 10 patch 0  . gabapentin (NEURONTIN) 100 MG capsule Take 100 mg by mouth daily as needed (pain).     . haloperidol (HALDOL) 5 MG tablet Take 5 mg by mouth 2 (two) times daily.    Marland Kitchen ipratropium-albuterol (DUONEB) 0.5-2.5 (3) MG/3ML SOLN Take 3 mLs by nebulization every 4 (four) hours as needed. (Patient not taking: Reported on 02/07/2018) 360 mL 0  . lisinopril-hydrochlorothiazide (PRINZIDE,ZESTORETIC) 20-25 MG tablet Take 1 tablet by mouth daily.    . nicotine (NICODERM CQ - DOSED IN MG/24 HOURS) 14 mg/24hr patch Place 14 mg onto the skin daily.     . ondansetron (ZOFRAN) 8 MG tablet Take 1 tablet (8 mg total) by mouth every 8 (eight) hours as needed for nausea  or vomiting. (Patient not taking: Reported on 02/07/2018) 60 tablet 2  . oxyCODONE (OXY IR/ROXICODONE) 5 MG immediate release tablet Take 1 tablet (5 mg total) by mouth every 6 (six) hours as needed for severe pain. 120 tablet 0  . polyethylene glycol (MIRALAX / GLYCOLAX) packet MIX AND DRINK 1 PACKET TWICE DAILY (Patient not taking: Reported on 02/20/2018) 14 packet 0  . rosuvastatin (CRESTOR) 20 MG tablet Take 20 mg by mouth every evening.     . senna-docusate  (SENOKOT-S) 8.6-50 MG tablet Take 1 tablet by mouth 2 (two) times daily. (Patient not taking: Reported on 02/20/2018) 30 tablet 0   No current facility-administered medications for this visit.     SURGICAL HISTORY:  Past Surgical History:  Procedure Laterality Date  . HERNIA REPAIR    . IR BONE TUMOR(S)RF ABLATION  01/30/2018  . IR KYPHO THORACIC WITH BONE BIOPSY  01/30/2018  . IR RADIOLOGIST EVAL & MGMT  01/23/2018  . KIDNEY SURGERY    . RADIOLOGY WITH ANESTHESIA N/A 01/12/2018   Procedure: MRI WITH ANESTHESIA;  Surgeon: Radiologist, Medication, MD;  Location: [facility];  Service: Radiology;  Laterality: N/A;  . VIDEO BRONCHOSCOPY Bilateral 11/14/2017   Procedure: VIDEO BRONCHOSCOPY WITH FLUORO;  Surgeon: Chesley Mires, MD;  Location: [facility];  Service: Endoscopy;  Laterality: Bilateral;    REVIEW OF SYSTEMS:  Constitutional: positive for fatigue Eyes: negative Ears, nose, mouth, throat, and face: negative Respiratory: positive for cough and dyspnea on exertion Cardiovascular: negative Gastrointestinal: negative Genitourinary:negative Integument/breast: negative Hematologic/lymphatic: negative Musculoskeletal:negative Neurological: negative Behavioral/Psych: negative Endocrine: negative Allergic/Immunologic: negative   PHYSICAL EXAMINATION: General appearance: alert, cooperative, fatigued and no distress Head: Normocephalic, without obvious abnormality, atraumatic Neck: no adenopathy, no JVD, supple, symmetrical, trachea midline and thyroid not enlarged, symmetric, no tenderness/mass/nodules Lymph nodes: Cervical, supraclavicular, and axillary nodes normal. Resp: clear to auscultation bilaterally Back: symmetric, no curvature. ROM normal. No CVA tenderness. Cardio: regular rate and rhythm, S1, S2 normal, no murmur, click, rub or gallop GI: soft, non-tender; bowel sounds normal; no masses,  no organomegaly Extremities: extremities normal, atraumatic, no cyanosis or  edema Neurologic: Alert and oriented X 3, normal strength and tone. Normal symmetric reflexes. Normal coordination and gait  ECOG PERFORMANCE STATUS: 1 - Symptomatic but completely ambulatory  Blood pressure 116/82, pulse (!) 110, temperature 97.8 F (36.6 C), temperature source Oral, resp. rate 18, height '5\' 1"'$  (1.549 m), weight 152 lb 8 oz (69.2 kg), SpO2 96 %.  LABORATORY DATA: Lab Results  Component Value Date   WBC 8.1 02/20/2018   HGB 13.8 02/20/2018   HCT 39.9 02/20/2018   MCV 85.3 02/20/2018   PLT 224 02/20/2018      Chemistry      Component Value Date/Time   NA 130 (L) 02/20/2018 1111   K 3.7 02/20/2018 1111   CL 96 (L) 02/20/2018 1111   CO2 24 02/20/2018 1111   BUN 14 02/20/2018 1111   CREATININE 0.69 02/20/2018 1111      Component Value Date/Time   CALCIUM 10.9 (H) 02/20/2018 1111   ALKPHOS 153 (H) 02/20/2018 1111   AST 13 (L) 02/20/2018 1111   ALT 11 02/20/2018 1111   BILITOT 0.8 02/20/2018 1111       RADIOGRAPHIC STUDIES: Mr Shoulder Left Wo Contrast  Result Date: 02/15/2018 CLINICAL DATA:  Left shoulder pain for 2 months. History of metastatic lung carcinoma. EXAM: MRI OF THE LEFT SHOULDER WITHOUT CONTRAST TECHNIQUE: Multiplanar, multisequence MR imaging of the shoulder was performed. No intravenous  contrast was administered. COMPARISON:  MRI thoracic spine 01/12/2018.  CT chest 01/07/2018. FINDINGS: Rotator cuff:  Intact and normal in appearance. Muscles:  Normal without atrophy or focal lesion. Biceps long head:  Intact and normal appearance. Acromioclavicular Joint: Minimal degenerative change is noted. Type II acromion. No subacromial/subdeltoid bursal fluid. Glenohumeral Joint: Appears normal. Labrum:  Intact. Bones: There is a lesion in the anterior aspect of the proximal humerus consistent with a metastatic deposit measuring 2.5 cm AP by 1.9 cm transverse by approximately 3.1 cm craniocaudal. Also seen is a destructive lesion in the left first rib with  associated soft tissue nodule measuring approximately 2.7 cm transverse. This is incompletely imaged. Other: None. IMPRESSION: Lesions in the proximal humerus and left second rib consistent with metastatic deposits. Intact rotator cuff, long head of biceps and glenoid labrum. Minimal acromioclavicular osteoarthritis. Electronically Signed   By: Inge Rise M.D.   On: 02/15/2018 10:12   Ir Bone Tumor(s)rf Ablation  Result Date: 01/30/2018 INDICATION: T7 pathologic fracture.  Lung cancer. EXAM: T7 ABLATION AND KYPHOPLASTY COMPARISON:  01/12/2018 MEDICATIONS: Ancef 2 g; The antibiotic was administered in an appropriate time interval prior to needle puncture of the skin. ANESTHESIA/SEDATION: General - as administered by the Anesthesia department Moderate Sedation Time:  59 minutes The patient was continuously monitored during the procedure by the interventional radiology nurse under my direct supervision. FLUOROSCOPY TIME:  Fluoroscopy Time: 12 minutes 0 seconds (165 mGy). COMPLICATIONS: None immediate. TECHNIQUE: Informed written consent was obtained from the patient after a thorough discussion of the procedural risks, benefits and alternatives. All questions were addressed. Maximal Sterile Barrier Technique was utilized including caps, mask, sterile gowns, sterile gloves, sterile drape, hand hygiene and skin antiseptic. A timeout was performed prior to the initiation of the procedure. In the prone position, the back was prepped and draped in a sterile fashion and lidocaine was utilized for local anesthesia. Under fluoroscopic guidance, a 10 gauge needle was inserted into the T7 vertebral body via right pedicle approach. A cement trocar was then inserted for bone biopsy. The measuring drill was then advanced into the vertebral body yielding a measurements suitable for a 15 mm ablation probe. The identical procedure was performed for the left pedicle. The radiofrequency ablation probes were then inserted into  both cannula needles and positioned in the T7 vertebral body. The TIPS are positioned in close proximity to each other. Ablation was successfully performed for 15 minutes. The probes were removed. Fifteen 2 balloons were inserted into each cannula needles and insufflated to 200 pounds per square inch. The needles were removed and cement was deposited through each pedicle utilizing the cement delivery system and cement trocar needles. The trocar needles and outer cannula needles were removed. There is no extraosseous cement. FINDINGS: Imaging documents needle placement in the T7 vertebral body via bipedicular approach Subsequent imaging demonstrates drill placement in the vertebral bodies. Next series of images demonstrate placement of radiofrequency probes within the T7 vertebral body and a by pedicle approach. Subsequent imaging documents by pedicle kyphoplasty Final imaging demonstrates cement filling the vertebral body and crossing the midline without extraosseous cement. IMPRESSION: Successful T7 biopsy, radiofrequency ablation, and kyphoplasty as described above. Electronically Signed   By: Marybelle Killings M.D.   On: 01/30/2018 16:45   Ir Kypho Thoracic With Bone Biopsy  Result Date: 01/30/2018 INDICATION: T7 pathologic fracture.  Lung cancer. EXAM: T7 ABLATION AND KYPHOPLASTY COMPARISON:  01/12/2018 MEDICATIONS: Ancef 2 g; The antibiotic was administered in an appropriate time interval  prior to needle puncture of the skin. ANESTHESIA/SEDATION: General - as administered by the Anesthesia department Moderate Sedation Time:  59 minutes The patient was continuously monitored during the procedure by the interventional radiology nurse under my direct supervision. FLUOROSCOPY TIME:  Fluoroscopy Time: 12 minutes 0 seconds (165 mGy). COMPLICATIONS: None immediate. TECHNIQUE: Informed written consent was obtained from the patient after a thorough discussion of the procedural risks, benefits and alternatives. All  questions were addressed. Maximal Sterile Barrier Technique was utilized including caps, mask, sterile gowns, sterile gloves, sterile drape, hand hygiene and skin antiseptic. A timeout was performed prior to the initiation of the procedure. In the prone position, the back was prepped and draped in a sterile fashion and lidocaine was utilized for local anesthesia. Under fluoroscopic guidance, a 10 gauge needle was inserted into the T7 vertebral body via right pedicle approach. A cement trocar was then inserted for bone biopsy. The measuring drill was then advanced into the vertebral body yielding a measurements suitable for a 15 mm ablation probe. The identical procedure was performed for the left pedicle. The radiofrequency ablation probes were then inserted into both cannula needles and positioned in the T7 vertebral body. The TIPS are positioned in close proximity to each other. Ablation was successfully performed for 15 minutes. The probes were removed. Fifteen 2 balloons were inserted into each cannula needles and insufflated to 200 pounds per square inch. The needles were removed and cement was deposited through each pedicle utilizing the cement delivery system and cement trocar needles. The trocar needles and outer cannula needles were removed. There is no extraosseous cement. FINDINGS: Imaging documents needle placement in the T7 vertebral body via bipedicular approach Subsequent imaging demonstrates drill placement in the vertebral bodies. Next series of images demonstrate placement of radiofrequency probes within the T7 vertebral body and a by pedicle approach. Subsequent imaging documents by pedicle kyphoplasty Final imaging demonstrates cement filling the vertebral body and crossing the midline without extraosseous cement. IMPRESSION: Successful T7 biopsy, radiofrequency ablation, and kyphoplasty as described above. Electronically Signed   By: Marybelle Killings M.D.   On: 01/30/2018 16:45    ASSESSMENT AND  PLAN: This is a very pleasant 57 years old white female recently diagnosed with stage IV non-small cell lung cancer, poorly differentiated adenocarcinoma presented with large right lower lobe lung mass in addition to mediastinal lymphadenopathy and metastatic bone disease as well as solitary brain metastasis.  She treated palliative radiotherapy to the right lower lobe lung mass as well as the brain metastasis. I had a lengthy discussion with the patient today about her current condition and treatment options. I discussed with the patient again the option of palliative care and hospice referral versus proceeding with palliative treatment with single agent immunotherapy with Keytruda 200 mg IV every 3 weeks. She is interested in proceeding with immunotherapy and she will proceed with the first cycle of her treatment today. I will see the patient back for follow-up visit in 3 weeks for evaluation before the next cycle of her treatment. For smoking cessation, I strongly encouraged the patient to quit smoking and offered her smoke cessation program. The patient was advised to call immediately if she has any concerning symptoms in the interval. The patient voices understanding of current disease status and treatment options and is in agreement with the current care plan.  All questions were answered. The patient knows to call the clinic with any problems, questions or concerns. We can certainly see the patient much sooner if  necessary.  I spent 15 minutes counseling the patient face to face. The total time spent in the appointment was 25 minutes.  Disclaimer: This note was dictated with voice recognition software. Similar sounding words can inadvertently be transcribed and may not be corrected upon review.

## 2018-02-23 ENCOUNTER — Other Ambulatory Visit: Payer: Self-pay | Admitting: *Deleted

## 2018-02-23 ENCOUNTER — Ambulatory Visit: Payer: Medicare Other | Admitting: Radiation Oncology

## 2018-02-23 ENCOUNTER — Ambulatory Visit: Payer: Medicare Other

## 2018-02-23 DIAGNOSIS — C7951 Secondary malignant neoplasm of bone: Secondary | ICD-10-CM

## 2018-02-26 ENCOUNTER — Ambulatory Visit: Payer: Medicare Other

## 2018-02-27 ENCOUNTER — Encounter: Payer: Self-pay | Admitting: *Deleted

## 2018-02-27 ENCOUNTER — Ambulatory Visit: Payer: Medicare Other

## 2018-02-27 NOTE — Progress Notes (Signed)
[facility] Work  Clinical Social Work was referred by radiation oncology for assessment of psychosocial needs.  Clinical Social Worker contacted patient by phone  to offer support and assess for needs.  Patient stated her brother can take her to appointments, but she has not been feeling well enough to come for treatment.  Patient interested in receiving gas cards from [facility] to provide to brother- patient plans to follow up with CSW. CSW reviewed [facility] transportation program with patient, she is interested in using as a "back up" if brother can't bring her.  CSW made referral to Ginette Otto, Solicitor.     Gwinda Maine, LCSW  Clinical Social Worker [facility]

## 2018-02-28 ENCOUNTER — Ambulatory Visit
Admission: RE | Admit: 2018-02-28 | Discharge: 2018-02-28 | Disposition: A | Discharge status: Home / Self Care | Payer: Medicare Other | Source: Ambulatory Visit | Attending: Radiation Oncology | Admitting: Radiation Oncology

## 2018-02-28 DIAGNOSIS — Z510 Encounter for antineoplastic radiation therapy: Secondary | ICD-10-CM | POA: Diagnosis not present

## 2018-03-01 ENCOUNTER — Ambulatory Visit: Payer: Medicare Other | Admitting: Radiation Oncology

## 2018-03-01 ENCOUNTER — Ambulatory Visit: Payer: Medicare Other

## 2018-03-02 ENCOUNTER — Ambulatory Visit: Payer: Medicare Other

## 2018-03-05 ENCOUNTER — Ambulatory Visit: Payer: Medicare Other

## 2018-03-06 ENCOUNTER — Telehealth: Payer: Self-pay | Admitting: Internal Medicine

## 2018-03-06 ENCOUNTER — Telehealth: Payer: Self-pay | Admitting: Radiation Therapy

## 2018-03-06 ENCOUNTER — Ambulatory Visit: Payer: Medicare Other

## 2018-03-06 ENCOUNTER — Encounter: Payer: Self-pay | Admitting: Internal Medicine

## 2018-03-06 DIAGNOSIS — G893 Neoplasm related pain (acute) (chronic): Secondary | ICD-10-CM | POA: Insufficient documentation

## 2018-03-06 DIAGNOSIS — Z515 Encounter for palliative care: Secondary | ICD-10-CM | POA: Insufficient documentation

## 2018-03-06 DIAGNOSIS — C7801 Secondary malignant neoplasm of right lung: Secondary | ICD-10-CM | POA: Insufficient documentation

## 2018-03-06 NOTE — Telephone Encounter (Signed)
Jill [name] called to cancel her radiation treatments this week, with plans to resume next week after the hospice referral is completed. She said that she has had a decline over the past few weeks. She is unable to bathe herself and has difficulty getting up off of the toilet at times. The home health nurse from [facility], who changes her PICC line bandage, is reaching out to her PCP, Dr. Redmond Pulling, for a Hospice referral. Jill [name] is [name] need of home equipment, bathing assistance, nutritional support, and pain/symptom management. The reason why Jill [name] for treatment this week is because she is waiting on the hospice referral and [name] home set up to be completed.        Jill [name] has reached out to Candy Kitchen about this as well to discuss continuing Jill [name] and systemic treatment with Medical oncology, or if she is only interested [name] quality of life/symptom management. Jill [name] said that she is no longer interested [name] continuing with the [facility]. Jill [name] feels that she has declined since the treatment started and wants to pursue Hospice and comfort measures instead. Her decision is based on hospice meeting her "[name] home" needs.   I will pass this information on to both Dr. Lisbeth Renshaw and Dr. Julien Nordmann.   Mont Dutton R.T.(R)(T) Special Procedures Navigator  [phone]

## 2018-03-06 NOTE — Plan of Care (Signed)
  I spoke to both Olga Millers for radiation oncology and Sherolyn Buba LCSW for [facility] regarding patient [name].  Patient  has been unable to be successful with  her outpatient cancer treatments secondary to multiple issues; limited social support, transportation and pain .  She has completed only 1 of her scheduled radiation treatments.  I met briefly with Ms. Ambrosino and her brothers a few weeks ago in the outpatient radiation oncology clinic attempting to clarify goals of care.  Today I placed a call and  spoke directly to patient Jill [name] regarding her current medical situation. Me that since her first treatment of Keytruda on August 22 she has "felt very bad".  She tells me she is eating very little and getting much weaker.  She is unable to bathe.  Her brother Coralyn Mark is her main support person who lives by and "tries to help as best he can".  She tells me that her advanced home care nurse spoke to her today regarding community-based palliative services versus hospice services in her home.   I detailed the difference between the 2 and answered her questions and concerns to the best of my ability.  I spoke to [place] the home health nurse and she tells me that the patient's primary care provider Dr. Redmond Pulling with [facility] authorizes referral for hospice.  Patient tells me she is ready to forego any cancer treatment at this time and wishes to shift her focus of care to comfort and quality.  She tells me she no longer is interested in ongoing [facility] treatment for "radiation for that matter".  I placed a call to [facility] and gave above  information to the intake person.  Patient encouraged to call me with future questions or concerns.  I informed Mont Dutton of the above.  Wadie Lessen NP  Palliative Medicine Team Team Phone # [phone]

## 2018-03-07 ENCOUNTER — Ambulatory Visit: Payer: Medicare Other

## 2018-03-08 ENCOUNTER — Ambulatory Visit: Payer: Medicare Other

## 2018-03-09 ENCOUNTER — Ambulatory Visit: Payer: Medicare Other

## 2018-03-09 ENCOUNTER — Telehealth: Payer: Self-pay

## 2018-03-09 ENCOUNTER — Inpatient Hospital Stay: Payer: Medicare Other | Attending: Internal Medicine | Admitting: Internal Medicine

## 2018-03-09 ENCOUNTER — Other Ambulatory Visit: Payer: Self-pay | Admitting: Radiation Oncology

## 2018-03-09 MED ORDER — OXYCODONE HCL 5 MG PO TABS: 5.0000 mg | ORAL_TABLET | Freq: Four times a day (QID) | ORAL | 0 refills | Status: DC | PRN

## 2018-03-09 NOTE — Telephone Encounter (Signed)
Left message for patient to call [phone] to reschedule missed appointment.

## 2018-03-12 ENCOUNTER — Ambulatory Visit: Payer: Medicare Other

## 2018-03-12 ENCOUNTER — Ambulatory Visit: Payer: Self-pay | Admitting: Radiation Oncology

## 2018-03-12 ENCOUNTER — Encounter: Payer: Medicare Other | Admitting: Internal Medicine

## 2018-03-12 ENCOUNTER — Telehealth: Payer: Self-pay | Admitting: Radiation Therapy

## 2018-03-12 NOTE — Telephone Encounter (Signed)
Jill [name] left a message stating that she will not be in for radiation this week. She plans to only attend her [place] visit on 9/12. She also said that she is not interested in the transportation services we can offer. For now she only wants to come when her brother can bring her.   Mont Dutton R.T.(R)(T)

## 2018-03-13 ENCOUNTER — Ambulatory Visit: Payer: Medicare Other

## 2018-03-14 ENCOUNTER — Ambulatory Visit: Payer: Medicare Other

## 2018-03-15 ENCOUNTER — Observation Stay (HOSPITAL_COMMUNITY): Payer: Medicare Other

## 2018-03-15 ENCOUNTER — Inpatient Hospital Stay: Payer: Medicare Other

## 2018-03-15 ENCOUNTER — Inpatient Hospital Stay (HOSPITAL_COMMUNITY): Payer: Medicare Other

## 2018-03-15 ENCOUNTER — Ambulatory Visit: Payer: Medicare Other

## 2018-03-15 ENCOUNTER — Inpatient Hospital Stay (HOSPITAL_COMMUNITY)
Admission: EM | Admit: 2018-03-15 | Discharge: 2018-03-22 | DRG: 947 | Disposition: A | Discharge status: Skilled Nursing Facility | Payer: Medicare Other | Attending: Family Medicine | Admitting: Family Medicine

## 2018-03-15 ENCOUNTER — Encounter (HOSPITAL_COMMUNITY): Payer: Self-pay

## 2018-03-15 ENCOUNTER — Inpatient Hospital Stay: Payer: Medicare Other | Admitting: Nurse Practitioner

## 2018-03-15 ENCOUNTER — Other Ambulatory Visit: Payer: Self-pay

## 2018-03-15 ENCOUNTER — Other Ambulatory Visit: Payer: Self-pay | Admitting: Emergency Medicine

## 2018-03-15 DIAGNOSIS — C3431 Malignant neoplasm of lower lobe, right bronchus or lung: Secondary | ICD-10-CM | POA: Diagnosis present

## 2018-03-15 DIAGNOSIS — K5900 Constipation, unspecified: Secondary | ICD-10-CM | POA: Diagnosis not present

## 2018-03-15 DIAGNOSIS — Z803 Family history of malignant neoplasm of breast: Secondary | ICD-10-CM | POA: Diagnosis not present

## 2018-03-15 DIAGNOSIS — Z9981 Dependence on supplemental oxygen: Secondary | ICD-10-CM

## 2018-03-15 DIAGNOSIS — F209 Schizophrenia, unspecified: Secondary | ICD-10-CM | POA: Diagnosis present

## 2018-03-15 DIAGNOSIS — R059 Cough, unspecified: Secondary | ICD-10-CM

## 2018-03-15 DIAGNOSIS — D638 Anemia in other chronic diseases classified elsewhere: Secondary | ICD-10-CM | POA: Diagnosis present

## 2018-03-15 DIAGNOSIS — C7801 Secondary malignant neoplasm of right lung: Secondary | ICD-10-CM | POA: Diagnosis not present

## 2018-03-15 DIAGNOSIS — Z7189 Other specified counseling: Secondary | ICD-10-CM

## 2018-03-15 DIAGNOSIS — E876 Hypokalemia: Secondary | ICD-10-CM | POA: Diagnosis not present

## 2018-03-15 DIAGNOSIS — Z886 Allergy status to analgesic agent status: Secondary | ICD-10-CM | POA: Diagnosis not present

## 2018-03-15 DIAGNOSIS — R000 Tachycardia, unspecified: Secondary | ICD-10-CM | POA: Diagnosis present

## 2018-03-15 DIAGNOSIS — Y95 Nosocomial condition: Secondary | ICD-10-CM | POA: Diagnosis not present

## 2018-03-15 DIAGNOSIS — A419 Sepsis, unspecified organism: Secondary | ICD-10-CM | POA: Diagnosis not present

## 2018-03-15 DIAGNOSIS — J189 Pneumonia, unspecified organism: Secondary | ICD-10-CM | POA: Diagnosis present

## 2018-03-15 DIAGNOSIS — F17200 Nicotine dependence, unspecified, uncomplicated: Secondary | ICD-10-CM | POA: Diagnosis present

## 2018-03-15 DIAGNOSIS — C7951 Secondary malignant neoplasm of bone: Secondary | ICD-10-CM | POA: Diagnosis present

## 2018-03-15 DIAGNOSIS — E871 Hypo-osmolality and hyponatremia: Secondary | ICD-10-CM | POA: Diagnosis not present

## 2018-03-15 DIAGNOSIS — Z515 Encounter for palliative care: Secondary | ICD-10-CM | POA: Diagnosis present

## 2018-03-15 DIAGNOSIS — I10 Essential (primary) hypertension: Secondary | ICD-10-CM | POA: Diagnosis present

## 2018-03-15 DIAGNOSIS — F17210 Nicotine dependence, cigarettes, uncomplicated: Secondary | ICD-10-CM | POA: Diagnosis present

## 2018-03-15 DIAGNOSIS — R05 Cough: Secondary | ICD-10-CM

## 2018-03-15 DIAGNOSIS — J181 Lobar pneumonia, unspecified organism: Secondary | ICD-10-CM | POA: Diagnosis not present

## 2018-03-15 DIAGNOSIS — Z888 Allergy status to other drugs, medicaments and biological substances status: Secondary | ICD-10-CM

## 2018-03-15 DIAGNOSIS — C3491 Malignant neoplasm of unspecified part of right bronchus or lung: Secondary | ICD-10-CM | POA: Diagnosis present

## 2018-03-15 DIAGNOSIS — R531 Weakness: Secondary | ICD-10-CM

## 2018-03-15 DIAGNOSIS — Z6828 Body mass index (BMI) 28.0-28.9, adult: Secondary | ICD-10-CM

## 2018-03-15 DIAGNOSIS — R0902 Hypoxemia: Secondary | ICD-10-CM | POA: Diagnosis not present

## 2018-03-15 DIAGNOSIS — J9601 Acute respiratory failure with hypoxia: Secondary | ICD-10-CM | POA: Diagnosis not present

## 2018-03-15 DIAGNOSIS — Z818 Family history of other mental and behavioral disorders: Secondary | ICD-10-CM

## 2018-03-15 DIAGNOSIS — E872 Acidosis: Secondary | ICD-10-CM | POA: Diagnosis present

## 2018-03-15 DIAGNOSIS — M79606 Pain in leg, unspecified: Secondary | ICD-10-CM

## 2018-03-15 DIAGNOSIS — G893 Neoplasm related pain (acute) (chronic): Secondary | ICD-10-CM | POA: Diagnosis present

## 2018-03-15 DIAGNOSIS — Z885 Allergy status to narcotic agent status: Secondary | ICD-10-CM | POA: Diagnosis not present

## 2018-03-15 DIAGNOSIS — R627 Adult failure to thrive: Secondary | ICD-10-CM | POA: Diagnosis present

## 2018-03-15 DIAGNOSIS — C3490 Malignant neoplasm of unspecified part of unspecified bronchus or lung: Secondary | ICD-10-CM | POA: Diagnosis not present

## 2018-03-15 DIAGNOSIS — R64 Cachexia: Secondary | ICD-10-CM | POA: Diagnosis present

## 2018-03-15 DIAGNOSIS — R0602 Shortness of breath: Secondary | ICD-10-CM

## 2018-03-15 LAB — CBC WITH DIFFERENTIAL/PLATELET
Basophils Absolute: 0 10*3/uL (ref 0.0–0.1)
Basophils Relative: 0 %
Eosinophils Absolute: 0.1 10*3/uL (ref 0.0–0.7)
Eosinophils Relative: 2 %
HCT: 41.3 % (ref 36.0–46.0)
Hemoglobin: 13.7 g/dL (ref 12.0–15.0)
Lymphocytes Relative: 7 %
Lymphs Abs: 0.5 10*3/uL — ABNORMAL LOW (ref 0.7–4.0)
MCH: 29 pg (ref 26.0–34.0)
MCHC: 33.2 g/dL (ref 30.0–36.0)
MCV: 87.5 fL (ref 78.0–100.0)
Monocytes Absolute: 0.5 10*3/uL (ref 0.1–1.0)
Monocytes Relative: 7 %
Neutro Abs: 5.7 10*3/uL (ref 1.7–7.7)
Neutrophils Relative %: 84 %
Platelets: 278 10*3/uL (ref 150–400)
RBC: 4.72 MIL/uL (ref 3.87–5.11)
RDW: 17.4 % — ABNORMAL HIGH (ref 11.5–15.5)
WBC: 6.8 10*3/uL (ref 4.0–10.5)

## 2018-03-15 LAB — CREATININE, SERUM: Creatinine, Ser: 0.39 mg/dL — ABNORMAL LOW (ref 0.44–1.00)

## 2018-03-15 LAB — BASIC METABOLIC PANEL
Anion gap: 13 (ref 5–15)
BUN: 11 mg/dL (ref 6–20)
CO2: 18 mmol/L — ABNORMAL LOW (ref 22–32)
Calcium: 9.4 mg/dL (ref 8.9–10.3)
Chloride: 107 mmol/L (ref 98–111)
Creatinine, Ser: 0.4 mg/dL — ABNORMAL LOW (ref 0.44–1.00)
GFR calc Af Amer: >60 mL/min (ref >60)
GFR calc non Af Amer: >60 mL/min (ref >60)
Glucose, Bld: 102 mg/dL — ABNORMAL HIGH (ref 70–99)
Potassium: 4.5 mmol/L (ref 3.5–5.1)
Sodium: 138 mmol/L (ref 135–145)

## 2018-03-15 LAB — URINALYSIS, ROUTINE W REFLEX MICROSCOPIC
BILIRUBIN URINE: NEGATIVE
Bacteria, UA: NONE SEEN
GLUCOSE, UA: NEGATIVE mg/dL
KETONES UR: NEGATIVE mg/dL
LEUKOCYTES UA: NEGATIVE
Nitrite: NEGATIVE
PH: 6 (ref 5.0–8.0)
Protein, ur: NEGATIVE mg/dL
Specific Gravity, Urine: 1.01 (ref 1.005–1.030)

## 2018-03-15 LAB — CBC
HEMATOCRIT: 41 % (ref 36.0–46.0)
Hemoglobin: 13.4 g/dL (ref 12.0–15.0)
MCH: 28.9 pg (ref 26.0–34.0)
MCHC: 32.7 g/dL (ref 30.0–36.0)
MCV: 88.4 fL (ref 78.0–100.0)
Platelets: 346 10*3/uL (ref 150–400)
RBC: 4.64 MIL/uL (ref 3.87–5.11)
RDW: 17.5 % — ABNORMAL HIGH (ref 11.5–15.5)
WBC: 6.8 10*3/uL (ref 4.0–10.5)

## 2018-03-15 MED ORDER — IPRATROPIUM-ALBUTEROL 0.5-2.5 (3) MG/3ML IN SOLN
3.0000 mL | Freq: Three times a day (TID) | RESPIRATORY_TRACT | Status: DC
  Administered 2018-03-15 – 2018-03-16 (×3): 3 mL via RESPIRATORY_TRACT
  Filled 2018-03-15 (×3): qty 3

## 2018-03-15 MED ORDER — ACETAMINOPHEN 325 MG PO TABS
650.0000 mg | ORAL_TABLET | Freq: Four times a day (QID) | ORAL | Status: DC | PRN
  Administered 2018-03-15 – 2018-03-21 (×14): 650 mg via ORAL
  Filled 2018-03-15 (×14): qty 2

## 2018-03-15 MED ORDER — POLYETHYLENE GLYCOL 3350 17 G PO PACK: 17.0000 g | PACK | Freq: Every day | ORAL | Status: DC | PRN

## 2018-03-15 MED ORDER — ROSUVASTATIN CALCIUM 20 MG PO TABS
20.0000 mg | ORAL_TABLET | Freq: Every evening | ORAL | Status: DC
  Administered 2018-03-15 – 2018-03-21 (×7): 20 mg via ORAL
  Filled 2018-03-15 (×7): qty 1

## 2018-03-15 MED ORDER — HYDROMORPHONE HCL 2 MG PO TABS
2.0000 mg | ORAL_TABLET | ORAL | Status: DC | PRN
  Filled 2018-03-15: qty 1

## 2018-03-15 MED ORDER — NICOTINE 21 MG/24HR TD PT24
21.0000 mg | MEDICATED_PATCH | Freq: Every day | TRANSDERMAL | Status: DC
  Administered 2018-03-16 – 2018-03-22 (×7): 21 mg via TRANSDERMAL
  Filled 2018-03-15 (×7): qty 1

## 2018-03-15 MED ORDER — ACETAMINOPHEN 500 MG PO TABS
1000.0000 mg | ORAL_TABLET | Freq: Four times a day (QID) | ORAL | Status: DC | PRN
  Administered 2018-03-15: 1000 mg via ORAL
  Filled 2018-03-15: qty 2

## 2018-03-15 MED ORDER — SENNOSIDES-DOCUSATE SODIUM 8.6-50 MG PO TABS
1.0000 | ORAL_TABLET | Freq: Two times a day (BID) | ORAL | Status: DC
  Administered 2018-03-15 – 2018-03-18 (×7): 1 via ORAL
  Filled 2018-03-15 (×7): qty 1

## 2018-03-15 MED ORDER — HYDROMORPHONE HCL 2 MG PO TABS
2.0000 mg | ORAL_TABLET | ORAL | Status: DC | PRN
  Administered 2018-03-15 (×2): 2 mg via ORAL
  Filled 2018-03-15: qty 1

## 2018-03-15 MED ORDER — IPRATROPIUM-ALBUTEROL 0.5-2.5 (3) MG/3ML IN SOLN
3.0000 mL | Freq: Four times a day (QID) | RESPIRATORY_TRACT | Status: DC
  Administered 2018-03-15: 3 mL via RESPIRATORY_TRACT
  Filled 2018-03-15: qty 3

## 2018-03-15 MED ORDER — HALOPERIDOL 5 MG PO TABS
5.0000 mg | ORAL_TABLET | Freq: Two times a day (BID) | ORAL | Status: DC
  Administered 2018-03-15 – 2018-03-22 (×15): 5 mg via ORAL
  Filled 2018-03-15 (×12): qty 1
  Filled 2018-03-15: qty 10
  Filled 2018-03-15 (×3): qty 1

## 2018-03-15 MED ORDER — SORBITOL 70 % SOLN
30.0000 mL | Freq: Every day | Status: DC | PRN
  Filled 2018-03-15: qty 30

## 2018-03-15 MED ORDER — FENTANYL 25 MCG/HR TD PT72: 25.0000 ug | MEDICATED_PATCH | TRANSDERMAL | Status: DC

## 2018-03-15 MED ORDER — OXYCODONE HCL 5 MG PO TABS
5.0000 mg | ORAL_TABLET | Freq: Four times a day (QID) | ORAL | Status: DC | PRN
  Administered 2018-03-15: 5 mg via ORAL
  Filled 2018-03-15: qty 1

## 2018-03-15 MED ORDER — HYDROMORPHONE HCL 1 MG/ML IJ SOLN: 1.0000 mg | INTRAMUSCULAR | Status: DC | PRN

## 2018-03-15 MED ORDER — HYDROMORPHONE HCL 2 MG PO TABS
2.0000 mg | ORAL_TABLET | ORAL | Status: DC | PRN
  Administered 2018-03-15 – 2018-03-16 (×7): 4 mg via ORAL
  Administered 2018-03-16: 2 mg via ORAL
  Administered 2018-03-16 – 2018-03-18 (×13): 4 mg via ORAL
  Administered 2018-03-18: 2 mg via ORAL
  Administered 2018-03-19 (×3): 4 mg via ORAL
  Filled 2018-03-15 (×25): qty 2

## 2018-03-15 MED ORDER — ENOXAPARIN SODIUM 40 MG/0.4ML ~~LOC~~ SOLN
40.0000 mg | SUBCUTANEOUS | Status: DC
  Administered 2018-03-15 – 2018-03-22 (×8): 40 mg via SUBCUTANEOUS
  Filled 2018-03-15 (×8): qty 0.4

## 2018-03-15 NOTE — H&P (Signed)
HPI  Jill [name] BSJ:[id number] DOB: 1960/12/06 DOA: 03/15/2018  PCP: Aletha Halim., PA-C   Chief Complaint:   HPI:  57 year old female with stage IV cancer on Keytruda followed by Dr. Earlie Server though smoker, HTN, schizophrenia Last admission to the hospital 01/13/2018 with acute respiratory failure hypoxia secondary to postobstructive pneumonia complicated by compression fracture T7 vertebra Can do that underwent stereotactic radiation therapy under the care of Dr. Sherwood Gambler and radiation oncologist Dr. Lisbeth Renshaw--- has also been seen by palliative care for uncontrolled pain --on a fentanyl patch as well as oxycodone  CXR not done, UA not done, feeling weak and came back to emergency room Appears that the patient was seen by palliative care and has decided to forego [facility]--- is unclear at this time whether Dr. Julien Nordmann is aware or not of this discussion--- patient is scheduled for his second cycle of Keytruda today  Patient states she came to the emergency room because of left leg pain-she states that she has not really been able to ambulate with her cane that she uses at home and has had further debility because of uncontrolled left shoulder pain from her metastases--it appears that she is getting XRT to the left shoulder She has not had any falls or any other issues  She states that the oxycodone is not working and is requesting by name Dilaudid-she is on fentanyl patch which she is using currently  She states that she has had maybe a little bit of burning in her urine but no hematuria  ED Course: CO2 18, down from 24 BUN/creatinine 11/0.4, WBC 6.8 hemoglobin 13.7  Review of Systems:  Denies chest pain denies dark stool denies tarry stool denies other issue Negative for fever, visual changes, sore throat, rash, new muscle aches, chest pain, SOB, dysuria, bleeding, n/v/abdominal pain.  Past Medical History:  Diagnosis Date  . Cancer ([place])   . Hypertension   . Schizophrenia  [facility])     Past Surgical History:  Procedure Laterality Date  . HERNIA REPAIR    . IR BONE TUMOR(S)RF ABLATION  01/30/2018  . IR KYPHO THORACIC WITH BONE BIOPSY  01/30/2018  . IR RADIOLOGIST EVAL & MGMT  01/23/2018  . KIDNEY SURGERY    . RADIOLOGY WITH ANESTHESIA N/A 01/12/2018   Procedure: MRI WITH ANESTHESIA;  Surgeon: Radiologist, Medication, MD;  Location: [facility];  Service: Radiology;  Laterality: N/A;  . VIDEO BRONCHOSCOPY Bilateral 11/14/2017   Procedure: VIDEO BRONCHOSCOPY WITH FLUORO;  Surgeon: Chesley Mires, MD;  Location: [facility];  Service: Endoscopy;  Laterality: Bilateral;     reports that she quit smoking about 3 months ago. Her smoking use included cigarettes. She started smoking about 36 years ago. She has a 72.00 pack-year smoking history. She has never used smokeless tobacco. She reports that she does not drink alcohol or use drugs. Mobility: Walks with a cane Patient has been on disability since 1982 because of chronic back pain/schizophrenia  Allergies  Allergen Reactions  . Ibuprofen Nausea And Vomiting  . Morphine And Related     Headaches, Vomiting  . Prednisone     Dizziness and pain    Family History  Problem Relation Age of Onset  . Cancer Mother   States that mother passed away with breast cancer with metastases Father has dementia lives at a local ALF and is 56 years old   Prior to Admission medications   Medication Sig Start Date End Date Taking? Authorizing Provider  acetaminophen (TYLENOL) 500 MG tablet Take  1,000 mg by mouth every 6 (six) hours as needed for moderate pain.    Yes [provider]  fentaNYL (DURAGESIC - DOSED MCG/HR) 25 MCG/HR patch Place 1 patch (25 mcg total) onto the skin every 3 (three) days. 02/20/18  Yes Hayden Pedro, PA-C  haloperidol (HALDOL) 5 MG tablet Take 5 mg by mouth 2 (two) times daily.   Yes [provider]  nicotine (NICODERM CQ - DOSED IN MG/24 HOURS) 21 mg/24hr patch Place 21 mg onto the  skin daily.   Yes [provider]  oxyCODONE (OXY IR/ROXICODONE) 5 MG immediate release tablet Take 1 tablet (5 mg total) by mouth every 6 (six) hours as needed for severe pain. 03/09/18  Yes Hayden Pedro, PA-C  polyethylene glycol (MIRALAX / GLYCOLAX) packet [place] 1 PACKET TWICE DAILY Patient taking differently: Take 17 g by mouth daily as needed for mild constipation.  12/08/17  Yes Kyung Rudd, MD  rosuvastatin (CRESTOR) 20 MG tablet Take 20 mg by mouth every evening.    Yes [provider]  senna-docusate (SENOKOT-S) 8.6-50 MG tablet Take 1 tablet by mouth 2 (two) times daily. 11/18/17  Yes Hall, Carole N, DO  ipratropium-albuterol (DUONEB) 0.5-2.5 (3) MG/3ML SOLN Take 3 mLs by nebulization every 4 (four) hours as needed. Patient not taking: Reported on 02/07/2018 11/18/17   Kayleen Memos, DO  ondansetron (ZOFRAN) 8 MG tablet Take 1 tablet (8 mg total) by mouth every 8 (eight) hours as needed for nausea or vomiting. Patient not taking: Reported on 02/07/2018 12/06/17   Ventura Sellers, MD    Physical Exam:  Vitals:   03/15/18 0623  BP: 106/87  Pulse: (!) 104  Resp: 18  SpO2: 92%     Awake oriented Caucasian female in no apparent distress  EOMI NCAT mild pallor no icterus no JVD  Flat affect  S1-S2 no murmur rub or gallop-has a PICC line in the SVC  Chest is clinically clear with no added sound-on left arm is a nicotine patch, on right arm is a fentanyl patch,  Abdomen is soft no rebound no guarding,  Range of motion to left hip is somewhat intact but slightly antalgic  No lower extremity edema  Neurologically intact coherent not confused power 5/5 grossly, reflexes and sensory deferred  Psych euthymic congruent seems depressed  I have personally reviewed following labs and imaging studies  Labs:  As above  Imaging studies:   As above  Medical tests:   EKG independently reviewed: None  Test discussed with performing  physician:  No  Decision to obtain old records:   Yes  Review and summation of old records:   Yes  Active Problems:   Weakness   Assessment/Plan Metabolic acidosis unclear cause Stage IV metastatic lung cancer Adult failure to thrive 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 2 mg-would recommend palliative care who saw the patient earlier this month revisit pain management regimen-have placed a consult for them-patient now tells me when I talk to her that "I was in so much pain that I just wanted to give up-I am not ready to stop treatment"  Stage IV metastatic lung cancer diagnosed May 2019 on XRT to left shoulder with distant mets-scheduled for [facility] today we will cc Dr. Kaleen Mask input  CXR lungs shows diffuse interstitial opacities suspicious for edema -Would not treat  at this time  Metabolic acidosis unclear cause-repeat be met tomorrow a.m.-   Severity of Illness: The appropriate patient status for this patient is OBSERVATION. Observation status is judged to be reasonable and necessary in order to provide the required intensity of service to ensure the patient's safety. The patient's presenting symptoms, physical exam findings, and initial radiographic and laboratory data in the context of their medical condition is felt to place them at decreased risk for further clinical deterioration. Furthermore, it is anticipated that the patient will be medically stable for discharge from the hospital within 2 midnights of admission. The following factors support the patient status of observation.   " The patient's presenting symptoms include pain and debility. " The physical exam findings include discomfort in left lower extremity. " The initial radiographic and laboratory data are somewhat reassuring.      DVT prophylaxis: Lovenox Code  Status: Patient confirms full code Family Communication: No family present Consults called: Oncology and palliative care consulted  Time spent: 10 minutes  , MD  [facility] Direct contact: [phone] --[place]ia [place]  --[email or web address]; password [email or web address]  7PM-7AM contact night coverage as above  03/15/2018, 7:11 AM

## 2018-03-15 NOTE — ED Notes (Signed)
ED TO INPATIENT HANDOFF REPORT  Name/Age/Gender Jill [name] 57 y.o. female  Code Status Code Status History    Date Active Date Inactive Code Status Order ID Comments User Context   01/07/2018 0201 01/13/2018 2127 Full Code [id number]  Jani Gravel, MD ED   11/13/2017 0112 11/18/2017 1705 Full Code [id number]  Norval Morton, MD ED      Home/SNF/Other Home  Chief Complaint Generalized Body Aches  Level of Care/Admitting Diagnosis ED Disposition    ED Disposition Condition [place] [facility] Area: [facility] [[id number]]  Level of Care: Telemetry [5]  Admit to tele based on following criteria: Complex arrhythmia (Bradycardia/Tachycardia)  Diagnosis: Weakness [259563]  Admitting Physician: Norval Morton [[id number]]  Attending Physician: Norval Morton [[id number]]  PT Class (Do Not Modify): Observation [104]  PT Acc Code (Do Not Modify): Observation [10022]       Medical History Past Medical History:  Diagnosis Date  . Cancer ([place])   . Hypertension   . Schizophrenia ([place])     Allergies Allergies  Allergen Reactions  . Ibuprofen Nausea And Vomiting  . Morphine And Related     Headaches, Vomiting  . Prednisone     Dizziness and pain    IV Location/Drains/Wounds Patient Lines/Drains/Airways Status   Active Line/Drains/Airways    Name:   Placement date:   Placement time:   Site:   Days:   Peripheral IV 01/30/18 Right Hand   01/30/18    0805    Hand   44   PICC Single Lumen 01/08/18 PICC Right Cephalic 38 cm 0 cm   16/60/63    0160    Cephalic   66   Incision (Closed) 01/30/18 Back Upper   01/30/18    1102     44          Labs/Imaging Results for orders placed or performed in visit on 03/15/18 (from the past 48 hour(s))  CBC with Differential/Platelet     Status: Abnormal   Collection Time: 03/15/18  3:27 AM  Result Value Ref Range   WBC 6.8 4.0 - 10.5 K/uL   RBC 4.72 3.87 - 5.11 MIL/uL   Hemoglobin 13.7 12.0 - 15.0 g/dL   HCT  41.3 36.0 - 46.0 %   MCV 87.5 78.0 - 100.0 fL   MCH 29.0 26.0 - 34.0 pg   MCHC 33.2 30.0 - 36.0 g/dL   RDW 17.4 (H) 11.5 - 15.5 %   Platelets 278 150 - 400 K/uL   Neutrophils Relative % 84 %   Neutro Abs 5.7 1.7 - 7.7 K/uL   Lymphocytes Relative 7 %   Lymphs Abs 0.5 (L) 0.7 - 4.0 K/uL   Monocytes Relative 7 %   Monocytes Absolute 0.5 0.1 - 1.0 K/uL   Eosinophils Relative 2 %   Eosinophils Absolute 0.1 0.0 - 0.7 K/uL   Basophils Relative 0 %   Basophils Absolute 0.0 0.0 - 0.1 K/uL    Comment: Performed at [facility], [place] [address], [place], [place] [address]   No results found.  Pending Labs Unresulted Labs (From admission, onward)    Start     Ordered   03/15/18 0631  Urinalysis, Routine w reflex microscopic  STAT,   R     03/15/18 0630          Vitals/Pain Today's Vitals   03/15/18 0623 03/15/18 0624  BP: 106/87   Pulse: (!) 104   Resp:  18   SpO2: 92%   PainSc:  10-Worst pain ever    Isolation Precautions No active isolations  Medications Medications - No data to display  Mobility walks with person assist

## 2018-03-15 NOTE — Progress Notes (Signed)
[facility]  Patient Status: Active (receiving services up to time of hospitalization)  [facility] is providing the following services: RN and HHA  If patient discharges after hours, please call [phone].   Jill [name] 03/15/2018, 2:43 PM

## 2018-03-15 NOTE — Evaluation (Signed)
Physical Therapy Evaluation Patient Details Name: Jill [name] MRN: [id number] DOB: Dec 27, 1960 Today's Date: 03/15/2018   History of Present Illness  57 year old female with with PMH of schizophrenia, tobacco abuse and  stage IV non-small cell lung cancer (with metastases to bone), poorly differentiated adenocarcinoma diagnosed in May 2019 followed by Dr. Julien Nordmann of oncology; who presented with complaints of weakness  Clinical Impression  Pt admitted with above diagnosis. Pt currently with functional limitations due to the deficits listed below (see PT Problem List). Min assist for bed to recliner transfer, pt unable to ambulate 2* pain/fatigue. SaO2 95% on room air with activity, 3/4 dyspnea, HR 133. Pt lives alone and lacks 24* assistance from family. She's not able to independently mobilize nor perform ADLs, so will need ST-SNF, she is agreeable to this.  Pt will benefit from skilled PT to increase their independence and safety with mobility to allow discharge to the venue listed below.  '     Follow Up Recommendations SNF;Supervision/Assistance - 24 hour    Equipment Recommendations  Rolling walker with 5" wheels;Wheelchair (measurements PT);Wheelchair cushion (measurements PT)    Recommendations for Other Services       Precautions / Restrictions Precautions Precautions: Fall Precaution Comments: pt denies h/o falls but reports several "close calls" at home Restrictions Weight Bearing Restrictions: No      Mobility  Bed Mobility Overal bed mobility: Modified Independent             General bed mobility comments: rolled R, then R sidelying to sit, cannot roll L 2* L shoulder pain from bony mets  Transfers Overall transfer level: Needs assistance Equipment used: Rolling walker (2 wheeled) Transfers: Sit to/from [facility] Sit to Stand: Min assist Stand pivot transfers: Min assist       General transfer comment: assist to rise/steady; activity  tolerance limited by fatigue/pain, HR 133 with activity, 3/4 dyspnea, SaO2 95% on room air  Ambulation/Gait             General Gait Details: unable 2* fatigue/pain  Stairs            Wheelchair Mobility    Modified Rankin (Stroke Patients Only)       Balance Overall balance assessment: Needs assistance   Sitting balance-Leahy Scale: Fair     Standing balance support: Bilateral upper extremity supported Standing balance-Leahy Scale: Poor                               Pertinent Vitals/Pain Pain Assessment: 0-10 Pain Score: 7  Pain Location: L thigh and shoulder Pain Descriptors / Indicators: Burning Pain Intervention(s): Limited activity within patient's tolerance;Monitored during session;Premedicated before session    Home Living Family/patient expects to be discharged to:: Private residence Living Arrangements: Alone Available Help at Discharge: Family;Available PRN/intermittently Type of Home: Mobile home Home Access: Stairs to enter Entrance Stairs-Rails: Right Entrance Stairs-Number of Steps: 4 Home Layout: One level Home Equipment: Cane - single point Additional Comments: brother nearby, checks on pt but not available 24*    Prior Function Level of Independence: Independent with assistive device(s)         Comments: walks with SPC, "furniture walks", but was unable to get off of sofa on day of admission; pt stated she was having difficulty bathing herself     Hand Dominance        Extremity/Trunk Assessment   Upper Extremity Assessment Upper Extremity Assessment: LUE  deficits/detail LUE: Unable to fully assess due to pain    Lower Extremity Assessment Lower Extremity Assessment: LLE deficits/detail LLE Deficits / Details: knee ext -3/5, hip N/T - limited by pain LLE: Unable to fully assess due to pain LLE Sensation: WNL    Cervical / Trunk Assessment Cervical / Trunk Assessment: Kyphotic  Communication    Communication: No difficulties  Cognition Arousal/Alertness: Awake/alert Behavior During Therapy: WFL for tasks assessed/performed Overall Cognitive Status: Within Functional Limits for tasks assessed                                        General Comments      Exercises     Assessment/Plan    PT Assessment Patient needs continued PT services  PT Problem List Decreased mobility;Decreased strength;Decreased balance;Decreased activity tolerance;Pain       PT Treatment Interventions DME instruction;Gait training;Functional mobility training;Therapeutic activities;Therapeutic exercise;Balance training;Patient/family education    PT Goals (Current goals can be found in the Care Plan section)  Acute Rehab PT Goals Patient Stated Goal: pt wants to DC to SNF where she would have assistance available PT Goal Formulation: With patient Time For Goal Achievement: 03/29/18 Potential to Achieve Goals: Fair    Frequency Min 2X/week   Barriers to discharge Decreased caregiver support pt lives alone, brother assists some but isn't available 24*    Co-evaluation               AM-PAC PT "6 Clicks" Daily Activity  Outcome Measure Difficulty turning over in bed (including adjusting bedclothes, sheets and blankets)?: A Little Difficulty moving from lying on back to sitting on the side of the bed? : A Little Difficulty sitting down on and standing up from a chair with arms (e.g., wheelchair, bedside commode, etc,.)?: Unable Help needed moving to and from a bed to chair (including a wheelchair)?: A Lot Help needed walking in hospital room?: Total Help needed climbing 3-5 steps with a railing? : Total 6 Click Score: 11    End of Session Equipment Utilized During Treatment: Gait belt Activity Tolerance: Patient limited by fatigue;Patient limited by pain Patient left: in chair;with call bell/phone within reach Nurse Communication: Mobility status PT Visit Diagnosis:  Muscle weakness (generalized) (M62.81);Pain;Difficulty in walking, not elsewhere classified (R26.2);Unsteadiness on feet (R26.81);Adult, failure to thrive (R62.7) Pain - Right/Left: Left Pain - part of body: Shoulder;Hip    Time: 3846-6599 PT Time Calculation (min) (ACUTE ONLY): 21 min   Charges:   PT Evaluation $PT Eval Low Complexity: 1 Low            Blondell Reveal Kistler PT 03/15/2018  Acute Rehabilitation Services Pager [phone] Office [phone]

## 2018-03-15 NOTE — ED Provider Notes (Signed)
See Epic downtime documentation.   Jill [name], Jenny Reichmann, MD 03/15/18 [phone]

## 2018-03-15 NOTE — Consult Note (Signed)
Consultation Note Date: 03/15/2018   Patient Name: Jill [name]  DOB: 1960/07/31  MRN: [id number]  Age / Sex: 57 y.o., female  PCP: Aletha Halim., PA-C Referring Physician: Nita Sells, MD  Reason for Consultation: Establishing goals of care and Pain control  HPI/Patient Profile: 57 y.o. female  with past medical history of stage IV cancer on Keytruda and limited radiation, HTN, schizophrenia, postobstructive PNA, compression fracture admitted on 03/15/2018 with cancer related pain who had prior stated she would like hospice support but is now stating that she is interested in further immunotherapy.   Clinical Assessment and Goals of Care: Chart reviewed including prior notes and personal review of pertinent imaging.  I met today with [name].  I introduced palliative care as specialized medical care for people living with serious illness. It focuses on providing relief from the symptoms and stress of a serious illness. The goal is to improve quality of life for both the patient and the family.  She is familiar with our team from previous interaction with Sharolyn Douglas from our team.  She reports living alone and having continued decline in her functional status.  Her brother helps as much as he can, but he is limited in his ability to assist her.  We discussed clinical course as well as wishes moving forward in regard to advanced directives.  We discussed her prior desire to forgo further disease modifying therapy, and she reports that she now desires to have further treatment with immunotherapy.  Reports that she is not interested in pursing further radiation at this time.  We discussed her pain at length.  Pain is currently 7-8/10.  At max pain is 10/10 and goes to 7/10 following oral dilaudid.  Her goal for pain would be 2-3/10.  Pain predominantly in the left hip.  She had been taking  oxycodone at home for breakthrough with no relief.  Concept of Hospice and Palliative Care were discussed  Questions and concerns addressed.   PMT will continue to support holistically.  SUMMARY OF RECOMMENDATIONS   - Full code - She is no longer interested in hospice at this point.  She desires further treatment with immunotherapy.  Admitting physician has notified him of admission.   - She is agreeable to SNF for rehab on discharge.  Reports that she is currently unable to care for herself at home. - Reports brothers would be her surrogate if she cannot make her own decisions. - Pain management as below.  Code Status/Advance Care Planning:  Full code    Symptom Management:   Pain: Patient reports that she has better pain relief with dilaudid than oxycodone.  Will d/c oxycodone and start dilaudid 2-52m every 3 hours as needed.  I think it is very possible that we may need to increase her fentanyl patch dose after determine a better idea of her overall opioid requirement to control pain.  With known bony mets and new worsened hip pain, question if new/worsening mets in that area.  Consider addition of  steroid, but await oncology thoughts on this as she would like further immunotherapy.  We also discussed further imaging and possible referral to radiation oncology if this is worsening mets, but she states that she would not consent to further radiation "right now" and declined for me to discuss this option further with primary hospitalist.  Constipation: Agree with current bowel regimen.  Continue to assess need for change to bowel regimen.      Palliative Prophylaxis:   Delirium Protocol and Frequent Pain Assessment  Additional Recommendations (Limitations, Scope, Preferences):  Full Scope Treatment  Psycho-social/Spiritual:   Desire for further Chaplaincy support:no  Additional Recommendations: Caregiving  Support/Resources  Prognosis:   Unable to determine  Discharge  Planning: [place] for rehab with Palliative care service follow-up      Primary Diagnoses: Present on Admission: . Cancer related pain   I have reviewed the medical record, interviewed the patient and family, and examined the patient. The following aspects are pertinent.  Past Medical History:  Diagnosis Date  . Cancer ([place])   . Hypertension   . Schizophrenia [facility])    Social History   Socioeconomic History  . Marital status: Single    Spouse name: Not on file  . Number of children: Not on file  . Years of education: Not on file  . Highest education level: Not on file  Occupational History  . Not on file  Social Needs  . Financial resource strain: Not on file  . Food insecurity:    Worry: Not on file    Inability: Not on file  . Transportation needs:    Medical: Not on file    Non-medical: Not on file  Tobacco Use  . Smoking status: Former Smoker    Packs/day: 2.00    Years: 36.00    Pack years: 72.00    Types: Cigarettes    Start date: 10/31/1981    Last attempt to quit: 11/27/2017    Years since quitting: 0.2  . Smokeless tobacco: Never Used  . Tobacco comment: HAS CALLED QUIT 4 FREE ALREADY  Substance and Sexual Activity  . Alcohol use: Never    Frequency: Never  . Drug use: Never  . Sexual activity: Not on file  Lifestyle  . Physical activity:    Days per week: Not on file    Minutes per session: Not on file  . Stress: Not on file  Relationships  . Social connections:    Talks on phone: Not on file    Gets together: Not on file    Attends religious service: Not on file    Active member of club or organization: Not on file    Attends meetings of clubs or organizations: Not on file    Relationship status: Not on file  Other Topics Concern  . Not on file  Social History Narrative  . Not on file   Family History  Problem Relation Age of Onset  . Cancer Mother    Scheduled Meds: . enoxaparin (LOVENOX) injection  40 mg Subcutaneous  Q24H  . [START ON 03/17/2018] fentaNYL  25 mcg Transdermal Q72H  . haloperidol  5 mg Oral BID  . ipratropium-albuterol  3 mL Nebulization TID  . [START ON 03/16/2018] nicotine  21 mg Transdermal Daily  . rosuvastatin  20 mg Oral QPM  . senna-docusate  1 tablet Oral BID   Continuous Infusions: PRN Meds:.acetaminophen, HYDROmorphone, polyethylene glycol, sorbitol Medications Prior to Admission:  Prior to Admission medications  Medication Sig Start Date End Date Taking? Authorizing Provider  acetaminophen (TYLENOL) 500 MG tablet Take 1,000 mg by mouth every 6 (six) hours as needed for moderate pain.    Yes [provider]  fentaNYL (DURAGESIC - DOSED MCG/HR) 25 MCG/HR patch Place 1 patch (25 mcg total) onto the skin every 3 (three) days. 02/20/18  Yes Hayden Pedro, PA-C  haloperidol (HALDOL) 5 MG tablet Take 5 mg by mouth 2 (two) times daily.   Yes [provider]  nicotine (NICODERM CQ - DOSED IN MG/24 HOURS) 21 mg/24hr patch Place 21 mg onto the skin daily.   Yes [provider]  oxyCODONE (OXY IR/ROXICODONE) 5 MG immediate release tablet Take 1 tablet (5 mg total) by mouth every 6 (six) hours as needed for severe pain. 03/09/18  Yes Hayden Pedro, PA-C  polyethylene glycol (MIRALAX / GLYCOLAX) packet [place] 1 PACKET TWICE DAILY Patient taking differently: Take 17 g by mouth daily as needed for mild constipation.  12/08/17  Yes Kyung Rudd, MD  rosuvastatin (CRESTOR) 20 MG tablet Take 20 mg by mouth every evening.    Yes [provider]  senna-docusate (SENOKOT-S) 8.6-50 MG tablet Take 1 tablet by mouth 2 (two) times daily. 11/18/17  Yes Hall, Carole N, DO  ipratropium-albuterol (DUONEB) 0.5-2.5 (3) MG/3ML SOLN Take 3 mLs by nebulization every 4 (four) hours as needed. Patient not taking: Reported on 02/07/2018 11/18/17   Kayleen Memos, DO  ondansetron (ZOFRAN) 8 MG tablet Take 1 tablet (8 mg total) by mouth every 8 (eight) hours as needed  for nausea or vomiting. Patient not taking: Reported on 02/07/2018 12/06/17   Ventura Sellers, MD   Allergies  Allergen Reactions  . Ibuprofen Nausea And Vomiting  . Morphine And Related     Headaches, Vomiting  . Prednisone     Dizziness and pain   Review of Systems  Constitutional: Positive for activity change, appetite change and fatigue.  Musculoskeletal: Positive for arthralgias, back pain and myalgias.  Neurological: Positive for weakness.    Physical Exam  General: Alert, awake, in no acute distress. Appears older that stated age. HEENT: No bruits, no goiter, no JVD Heart: Regular rate and rhythm. No murmur appreciated. Lungs: Good air movement, clear Abdomen: Soft, nontender, nondistended, positive bowel sounds.  Ext: No significant edema Skin: Warm and dry Neuro: Grossly intact, nonfocal.   Vital Signs: BP 128/81 (BP Location: Right Arm)   Pulse (!) 119   Temp 97.9 F (36.6 C) (Oral)   Resp 18   SpO2 96%  Pain Scale: 0-10   Pain Score: 8    SpO2: SpO2: 96 % O2 Device:SpO2: 96 % O2 Flow Rate: .   IO: Intake/output summary:   Intake/Output Summary (Last 24 hours) at 03/15/2018 1449 Last data filed at 03/15/2018 1354 Gross per 24 hour  Intake 600 ml  Output 500 ml  Net 100 ml    LBM:   Baseline Weight:   Most recent weight:       Palliative Assessment/Data:   Flowsheet Rows     Most Recent Value  Intake Tab  Referral Department  Hospitalist  Unit at Time of Referral  Oncology Unit  Palliative Care Primary Diagnosis  Cancer  Date Notified  03/15/18  Palliative Care Type  Return patient Palliative Care  Reason for referral  Pain  Date of Admission  03/15/18  Date first seen by Palliative Care  03/15/18  # of days Palliative referral response  time  0 Day(s)  # of days IP prior to Palliative referral  0  Clinical Assessment  Palliative Performance Scale Score  40%  Pain Max last 24 hours  10  Pain Min Last 24 hours  7  Psychosocial &  Spiritual Assessment  Palliative Care Outcomes  Patient/Family meeting held?  Yes  Palliative Care Outcomes  Improved pain interventions, Clarified goals of care      Time Total: 80 Greater than 50%  of this time was spent counseling and coordinating care related to the above assessment and plan.  Signed by: Micheline Rough, MD   Please contact Palliative Medicine Team phone at [phone] for questions and concerns.  For individual provider: See Shea Evans

## 2018-03-15 NOTE — Progress Notes (Signed)
The patient was seen briefly today. I also spoke with her admitting MD regarding her history. She was diagnosed with Stage IV NSCLC and has received the following courses:  01/10/2018 SRS Lumbar Spine: L5 // 18 Gy in 1 fraction  12/07/2017 SRS  Brain Treatment: PTV1:LeftParietal 60mm// 20 Gy in 1 fraction  11/16/2017 - 11/30/2017: RLL Lung / 30 Gy in 10 fractions  She was getting set up for treatment to her thoracic spine with SRS, but was unable to go to MRI for planning due to pain, and the plan was to proceed under general anesthesia. She cancelled this and was able to tolerate an MRI of the left shoulder which identified new disease there and in adjacent rib. She came for 1 treatment, but has no showed since due to pain or transportation. She has been seen by palliative care and recommendations were made for her to start fentanyl patches in addition to oxycodone for breakthru. She was also scheduled to start [facility] today but missed her appt due to progressive pain. She now describes left should and of the left thigh. Her PET scan in May 2019 revealed multiple sites of disease but also did confirm disease in the left femur. We discussed that we could consider radiotherapy to the left shoulder and adjacent rib in one fraction, a plan that is pending for her and she is in agreement. She reports there was a misunderstanding and she has never chosen to stop therapy, and requests that we begin tomorrow, which we will plan. We will likely need to treat her left hip as well and will order plain films to this site. We will have to re-assess her status overall as well with her spine, and will likely need to reimage her soon. Fortunately SRS can occur in the setting of ongoing [place], so we will likely be in the midst of her 1st or 2nd cycle when we can proceed. She is also looking into a facility for residency/rehabilitation. We will need that provider to also take over management of her pain medication  following her hospitalization. She is in agreement. The other option would be for outpatient palliative care in the facility to manage this. She is in agreement.      Carola Rhine, PAC

## 2018-03-15 NOTE — Plan of Care (Signed)
Discussed case with Dr. Florina Ou. Jill [name] is a 57 year old female with with stage IV non-small cell lung cancer, poorly differentiated adenocarcinoma diagnosed in May 2019 followed by Dr. Julien Nordmann of oncology; who presented with complaints of weakness.  Initial vital signs revealed heart rate was mildly elevated at 104.  Revealed labs were relatively unremarkable.  Urinalysis and chest x-ray ordered.  Dr. Florina Ou called [facility] to admit during downtime.  Accepted as observation to a MedSurg bed.

## 2018-03-16 ENCOUNTER — Encounter: Payer: Self-pay | Admitting: Radiation Oncology

## 2018-03-16 ENCOUNTER — Ambulatory Visit
Admission: RE | Admit: 2018-03-16 | Discharge: 2018-03-16 | Disposition: A | Discharge status: Home / Self Care | Payer: Medicare Other | Source: Ambulatory Visit | Attending: Radiation Oncology | Admitting: Radiation Oncology

## 2018-03-16 ENCOUNTER — Ambulatory Visit: Payer: Medicare Other

## 2018-03-16 LAB — COMPREHENSIVE METABOLIC PANEL
ALK PHOS: 178 U/L — AB (ref 38–126)
ALT: 20 U/L (ref 0–44)
AST: 21 U/L (ref 15–41)
Albumin: 2.7 g/dL — ABNORMAL LOW (ref 3.5–5.0)
Anion gap: 9 (ref 5–15)
BILIRUBIN TOTAL: 0.7 mg/dL (ref 0.3–1.2)
BUN: 8 mg/dL (ref 6–20)
CALCIUM: 9.5 mg/dL (ref 8.9–10.3)
CO2: 25 mmol/L (ref 22–32)
CREATININE: 0.3 mg/dL — AB (ref 0.44–1.00)
Chloride: 105 mmol/L (ref 98–111)
Glucose, Bld: 104 mg/dL — ABNORMAL HIGH (ref 70–99)
Potassium: 3.4 mmol/L — ABNORMAL LOW (ref 3.5–5.1)
Sodium: 139 mmol/L (ref 135–145)
Total Protein: 5.8 g/dL — ABNORMAL LOW (ref 6.5–8.1)

## 2018-03-16 LAB — CBC WITH DIFFERENTIAL/PLATELET
Basophils Absolute: 0 10*3/uL (ref 0.0–0.1)
Basophils Relative: 0 %
EOS PCT: 1 %
Eosinophils Absolute: 0 10*3/uL (ref 0.0–0.7)
HEMATOCRIT: 39 % (ref 36.0–46.0)
HEMOGLOBIN: 12.6 g/dL (ref 12.0–15.0)
LYMPHS ABS: 0.8 10*3/uL (ref 0.7–4.0)
LYMPHS PCT: 13 %
MCH: 28.8 pg (ref 26.0–34.0)
MCHC: 32.3 g/dL (ref 30.0–36.0)
MCV: 89 fL (ref 78.0–100.0)
Monocytes Absolute: 0.4 10*3/uL (ref 0.1–1.0)
Monocytes Relative: 6 %
Neutro Abs: 5 10*3/uL (ref 1.7–7.7)
Neutrophils Relative %: 80 %
Platelets: 327 10*3/uL (ref 150–400)
RBC: 4.38 MIL/uL (ref 3.87–5.11)
RDW: 17.4 % — ABNORMAL HIGH (ref 11.5–15.5)
WBC: 6.3 10*3/uL (ref 4.0–10.5)

## 2018-03-16 LAB — PHOSPHORUS: PHOSPHORUS: 2.9 mg/dL (ref 2.5–4.6)

## 2018-03-16 LAB — MAGNESIUM: MAGNESIUM: 1.7 mg/dL (ref 1.7–2.4)

## 2018-03-16 MED ORDER — LEVALBUTEROL HCL 0.63 MG/3ML IN NEBU
0.6300 mg | INHALATION_SOLUTION | Freq: Three times a day (TID) | RESPIRATORY_TRACT | Status: DC
  Administered 2018-03-16 – 2018-03-22 (×17): 0.63 mg via RESPIRATORY_TRACT
  Filled 2018-03-16 (×18): qty 3

## 2018-03-16 MED ORDER — IPRATROPIUM BROMIDE 0.02 % IN SOLN
0.5000 mg | Freq: Three times a day (TID) | RESPIRATORY_TRACT | Status: DC
  Administered 2018-03-16 – 2018-03-22 (×17): 0.5 mg via RESPIRATORY_TRACT
  Filled 2018-03-16 (×18): qty 2.5

## 2018-03-16 MED ORDER — FENTANYL 50 MCG/HR TD PT72
50.0000 ug | MEDICATED_PATCH | TRANSDERMAL | Status: DC
  Administered 2018-03-16: 50 ug via TRANSDERMAL
  Filled 2018-03-16: qty 1

## 2018-03-16 MED ORDER — SODIUM CHLORIDE 0.9 % IV BOLUS
1000.0000 mL | Freq: Once | INTRAVENOUS | Status: AC
  Administered 2018-03-16: 1000 mL via INTRAVENOUS

## 2018-03-16 MED ORDER — POTASSIUM CHLORIDE CRYS ER 20 MEQ PO TBCR: 40.0000 meq | EXTENDED_RELEASE_TABLET | Freq: Two times a day (BID) | ORAL | Status: DC

## 2018-03-16 MED ORDER — HYDROMORPHONE HCL 1 MG/ML IJ SOLN
1.0000 mg | INTRAMUSCULAR | Status: DC | PRN
  Administered 2018-03-16 – 2018-03-19 (×9): 1 mg via INTRAVENOUS
  Filled 2018-03-16 (×9): qty 1

## 2018-03-16 MED ORDER — POTASSIUM CHLORIDE CRYS ER 20 MEQ PO TBCR
40.0000 meq | EXTENDED_RELEASE_TABLET | ORAL | Status: AC
  Administered 2018-03-16 (×2): 40 meq via ORAL
  Filled 2018-03-16 (×2): qty 2

## 2018-03-16 NOTE — Clinical Social Work Note (Signed)
Clinical Social Work Assessment  Patient Details  Name: Jill [name] MRN: [id number] Date of Birth: [birth year]  Date of referral:  03/16/18               Reason for consult:  Facility Placement                Permission sought to share information with:    Permission granted to share information::     Name::        Agency::     Relationship::     Contact Information:     Housing/Transportation Living arrangements for the past 2 months:  Apartment Source of Information:  Patient, Medical Team Patient Interpreter Needed:  None Criminal Activity/Legal Involvement Pertinent to Current Situation/Hospitalization:  No - Comment as needed Significant Relationships:  Siblings Lives with:  Self Do you feel safe going back to the place where you live?  No Need for family participation in patient care:  No (Coment)  Care giving concerns:  Pt admitted from home where she resides alone. Has support of brothers in area, however limited by their availability to assist her is not adequate for current supervision/assistance needs per pt. Reports she has been largely lying in bed other than getting up to bathe/toilet.  Admitted for pain management. States she was also scheduled at [facility] to begin chemotherapy yesterday but was unable to attend due to pain.    Social Worker assessment / plan:  CSW consulted to assist with disposition. Met with pt at bedside- known to CSW from previous admission. Pt states her functional status has declined in past few weeks. Feels she cannot manage at home in current condition and requests facility placement. CSW processed with her what her intention for placement is- rehab versus custodial/residential care. Pt reports while she would like to participate with therapy if able once pain is controlled, largely she feels her care needs are not short term and she anticipates indefinite/long term care. Pt has Medicare primary insurance and Medicaid secondary. Discussed with her  how coverage differs depending on goal- Medicare for rehabilitation versus private pay or Medicaid (if her Medicaid is sufficient for coverage). She reports understanding. Would like to admit for rehab if she qualifies but will be seeking to stay long term.  CSW also discussed that chemotherapy treatment may limit facility options. Reports understanding.  Plan: Seeking SNF placement, pt desires to participate in rehabilitation if she qualifies and is able once pain managed, however also anticipating placement needs will be long term/custodial.   Employment status:  Disabled (Comment on whether or not currently receiving Disability)(receives disability) Insurance information:  Medicare(Community Medicaid) PT Recommendations:  Pace / Referral to community resources:  [place]  Patient/Family's Response to care:  Pt appreciative  Patient/Family's Understanding of and Emotional Response to Diagnosis, Current Treatment, and Prognosis:  Pt shows good understanding of her medical diagnoses and needs. However, her thinking seems somewhat concrete, unable to have extensive conversation about her goals and expectations for her progression. Very pleasant and adaptable  Emotional Assessment Appearance:    Attitude/Demeanor/Rapport:  Engaged Affect (typically observed):  (appropriate- reports being constricted by pain) Orientation:  Oriented to Self, Oriented to Place, Oriented to  Time, Oriented to Situation Alcohol / Substance use:  Not Applicable Psych involvement (Current and /or in the community):  Yes (Comment)([facility])  Discharge Needs  Concerns to be addressed:  Discharge Planning Concerns, Decision making concerns Readmission within the last 30 days:  No  Current discharge risk:  Chronically ill, Lack of support system, Dependent with [employer], Lives alone Barriers to Discharge:  (assessing)   Nila Nephew, LCSW 03/16/2018, 11:38 AM   [phone]

## 2018-03-16 NOTE — Progress Notes (Signed)
                                                                                                                                                                                                         Daily Progress Note   Patient Name: Jill [name]       Date: 03/16/2018 DOB: 10/29/1960  Age: 57 y.o. MRN#: [id number] Attending Physician: Sheikh, Omair Latif, DO Primary Care Physician: Kaplan, Kristen W., PA-C Admit Date: 03/15/2018  Reason for Consultation/Follow-up: Establishing goals of care and Pain control  Subjective: I met today with Ms. Bohl.  She reports that her pain has been better controlled since changing to dilaudid from oxycodone.  She had one episode of uncontrolled pain overnight even after oral dilaudid.    Chart reviewed and she has had 24 mg of PO dilaudid overnight.  Reports having BM today.  Length of Stay: 1  Current Medications: Scheduled Meds:  . enoxaparin (LOVENOX) injection  40 mg Subcutaneous Q24H  . fentaNYL  50 mcg Transdermal Q72H  . haloperidol  5 mg Oral BID  . ipratropium-albuterol  3 mL Nebulization TID  . nicotine  21 mg Transdermal Daily  . potassium chloride  40 mEq Oral Q4H  . rosuvastatin  20 mg Oral QPM  . senna-docusate  1 tablet Oral BID    Continuous Infusions:   PRN Meds: acetaminophen, HYDROmorphone (DILAUDID) injection, HYDROmorphone, polyethylene glycol, sorbitol  Physical Exam      General: Alert, awake, in no acute distress. Appears older that stated age. HEENT: No bruits, no goiter, no JVD Heart: Regular rate and rhythm. No murmur appreciated. Lungs: Good air movement, clear Abdomen: Soft, nontender, nondistended, positive bowel sounds.  Ext: No significant edema Skin: Warm and dry Neuro: Grossly intact, nonfocal.     Vital Signs: BP 106/82 (BP Location: Left Arm)   Pulse (!) 135   Temp 98 F (36.7 C) (Oral)   Resp 15   SpO2 90%  SpO2: SpO2: 90 % O2 Device: O2 Device: Room Air O2 Flow Rate:     Intake/output summary:   Intake/Output Summary (Last 24 hours) at 03/16/2018 1319 Last data filed at 03/16/2018 0959 Gross per 24 hour  Intake 950 ml  Output 1850 ml  Net -900 ml   LBM: Last BM Date: 03/15/18 Baseline Weight:   Most recent weight:         Palliative Assessment/Data:    Flowsheet Rows     Most Recent Value  Intake Tab  Referral Department  Hospitalist  Unit at Time of Referral    Oncology Unit  Palliative Care Primary Diagnosis  Cancer  Date Notified  03/15/18  Palliative Care Type  Return patient Palliative Care  Reason for referral  Pain  Date of Admission  03/15/18  Date first seen by Palliative Care  03/15/18  # of days Palliative referral response time  0 Day(s)  # of days IP prior to Palliative referral  0  Clinical Assessment  Palliative Performance Scale Score  40%  Pain Max last 24 hours  10  Pain Min Last 24 hours  7  Psychosocial & Spiritual Assessment  Palliative Care Outcomes  Patient/Family meeting held?  Yes  Palliative Care Outcomes  Improved pain interventions, Clarified goals of care      Patient Active Problem List   Diagnosis Date Noted  . Weakness 03/15/2018  . Cancer related pain   . Palliative care by specialist   . Metastatic lung carcinoma, unspecified laterality ([place])   . Bone metastasis ([place]) 01/13/2018  . HCAP (healthcare-associated pneumonia) 01/07/2018  . Tachycardia 01/07/2018  . Encounter for antineoplastic immunotherapy 12/12/2017  . DNR (do not resuscitate) discussion 12/12/2017  . Primary malignant neoplasm of bronchus of right lower lobe ([place]) 11/17/2017  . Brain metastases ([place]) 11/14/2017  . Sepsis due to pneumonia ([place]) 11/13/2017  . Hemoptysis 11/13/2017  . Hypoxia 11/13/2017  . Metastatic lung cancer (metastasis from lung to other site), right ([place]) 11/13/2017  . Chronic migraine without aura 11/13/2017  . OBESITY, NOS 08/31/2006  . Schizophrenia ([place]) 08/31/2006  . TOBACCO DEPENDENCE 08/31/2006  .  HYPERTENSION, BENIGN SYSTEMIC 08/31/2006  . MENOPAUSAL SYNDROME 08/31/2006  . HYDRADENITIS 08/31/2006    Palliative Care Assessment & Plan   Patient Profile: 57 y.o. female  with past medical history of stage IV cancer on Keytruda and limited radiation, HTN, schizophrenia, postobstructive PNA, compression fracture admitted on 03/15/2018 with cancer related pain who had prior stated she would like hospice support but is now stating that she is interested in further immunotherapy.   Assessment: Patient Active Problem List   Diagnosis Date Noted  . Weakness 03/15/2018  . Cancer related pain   . Palliative care by specialist   . Metastatic lung carcinoma, unspecified laterality ([place])   . Bone metastasis ([place]) 01/13/2018  . HCAP (healthcare-associated pneumonia) 01/07/2018  . Tachycardia 01/07/2018  . Encounter for antineoplastic immunotherapy 12/12/2017  . DNR (do not resuscitate) discussion 12/12/2017  . Primary malignant neoplasm of bronchus of right lower lobe ([place]) 11/17/2017  . Brain metastases ([place]) 11/14/2017  . Sepsis due to pneumonia ([place]) 11/13/2017  . Hemoptysis 11/13/2017  . Hypoxia 11/13/2017  . Metastatic lung cancer (metastasis from lung to other site), right ([place]) 11/13/2017  . Chronic migraine without aura 11/13/2017  . OBESITY, NOS 08/31/2006  . Schizophrenia ([place]) 08/31/2006  . TOBACCO DEPENDENCE 08/31/2006  . HYPERTENSION, BENIGN SYSTEMIC 08/31/2006  . MENOPAUSAL SYNDROME 08/31/2006  . HYDRADENITIS 08/31/2006    Recommendations/Plan:  Pain: Has used equivalent to 630m of oral morphine over the last 24 hours.  Plan to increase fentanyl to 559m/hr.  Continue dilaudid 30m530mO every 3 hours as needed.  Will also plan for addition of dilaudid 1mg69m to be given 40 minutes after oral medication if oral route is ineffective overnight tonight until higher dose fentanyl patch reaches steady state.  Goals of Care and Additional Recommendations:  Limitations on Scope  of Treatment: Full Scope Treatment  Code Status:    Code Status Orders  (From admission, onward)  Start     Ordered   03/15/18 0808  Full code  Continuous     03/15/18 0808        Code Status History    Date Active Date Inactive Code Status Order ID Comments User Context   01/07/2018 0201 01/13/2018 2127 Full Code [id number]  Kim, James, MD ED   11/13/2017 0112 11/18/2017 1705 Full Code [id number]  Smith, Rondell A, MD ED       Prognosis:   Unable to determine  Discharge Planning:  Skilled Nursing Facility for rehab with Palliative care service follow-up  Care plan was discussed with patient.  Bedside RN  Thank you for allowing the Palliative Medicine Team to assist in the care of this patient.   Total Time 45 Prolonged Time Billed No      Greater than 50%  of this time was spent counseling and coordinating care related to the above assessment and plan.   , MD  Please contact Palliative Medicine Team phone at [phone] for questions and concerns.      

## 2018-03-16 NOTE — NC FL2 (Addendum)
[place] LEVEL OF CARE SCREENING TOOL     IDENTIFICATION  Patient Name: Jill [name] Birthdate: August 15, 1960 Sex: female Admission Date (Current Location): 03/15/2018  [facility] and [place] Number:  Herbalist and Address:  [facility],  [place], [place]      Provider Number: [id number]  Attending Physician Name and Address:  Kerney Elbe, DO  Relative Name and Phone Number:       Current Level of Care: Hospital Recommended Level of Care: [place] Prior Approval Number:    Date Approved/Denied:   PASRR Number: pending  Discharge Plan: SNF    Current Diagnoses: Patient Active Problem List   Diagnosis Date Noted  . Weakness 03/15/2018  . Cancer related pain   . Palliative care by specialist   . Metastatic lung carcinoma, unspecified laterality ([place])   . Bone metastasis ([place]) 01/13/2018  . HCAP (healthcare-associated pneumonia) 01/07/2018  . Tachycardia 01/07/2018  . Encounter for antineoplastic immunotherapy 12/12/2017  . DNR (do not resuscitate) discussion 12/12/2017  . Primary malignant neoplasm of bronchus of right lower lobe ([place]) 11/17/2017  . Brain metastases ([place]) 11/14/2017  . Sepsis due to pneumonia ([place]) 11/13/2017  . Hemoptysis 11/13/2017  . Hypoxia 11/13/2017  . Metastatic lung cancer (metastasis from lung to other site), right ([place]) 11/13/2017  . Chronic migraine without aura 11/13/2017  . OBESITY, NOS 08/31/2006  . Schizophrenia ([place]) 08/31/2006  . TOBACCO DEPENDENCE 08/31/2006  . HYPERTENSION, BENIGN SYSTEMIC 08/31/2006  . MENOPAUSAL SYNDROME 08/31/2006  . HYDRADENITIS 08/31/2006    Orientation RESPIRATION BLADDER Height & Weight     Self, Time, Place, Situation  Normal Continent Weight:   Height:     BEHAVIORAL SYMPTOMS/MOOD NEUROLOGICAL BOWEL NUTRITION STATUS      Continent Diet(regular diet)  AMBULATORY STATUS COMMUNICATION OF NEEDS Skin   Extensive Assist  Verbally Normal                       Personal Care Assistance Level of Assistance  Bathing, Feeding, Dressing Bathing Assistance: Maximum assistance Feeding assistance: Independent Dressing Assistance: Maximum assistance     Functional Limitations Info  Sight, Hearing, Speech Sight Info: Adequate Hearing Info: Adequate Speech Info: Adequate    SPECIAL CARE FACTORS FREQUENCY  PT (By licensed PT), OT (By licensed OT)     PT Frequency: 5x OT Frequency: 3x            Contractures Contractures Info: Not present    Additional Factors Info  Code Status, Allergies Code Status Info: full code Allergies Info: Ibuprofen, Morphine And Related, Prednisone           Current Medications (03/16/2018):  This is the current hospital active medication list Current Facility-Administered Medications  Medication Dose Route Frequency Provider Last Rate Last Dose  . acetaminophen (TYLENOL) tablet 650 mg  650 mg Oral Q6H PRN Micheline Rough, MD   650 mg at 03/16/18 1128  . enoxaparin (LOVENOX) injection 40 mg  40 mg Subcutaneous Q24H Nita Sells, MD   40 mg at 03/16/18 1015  . fentaNYL (DURAGESIC - dosed mcg/hr) 50 mcg  50 mcg Transdermal Q72H Freeman, Gene, MD      . haloperidol (HALDOL) tablet 5 mg  5 mg Oral BID Nita Sells, MD   5 mg at 03/16/18 1015  . HYDROmorphone (DILAUDID) injection 1 mg  1 mg Intravenous Q3H PRN Micheline Rough, MD   1 mg at 03/16/18 1336  .  HYDROmorphone (DILAUDID) tablet 2-4 mg  2-4 mg Oral Q3H PRN Micheline Rough, MD   4 mg at 03/16/18 1128  . ipratropium-albuterol (DUONEB) 0.5-2.5 (3) MG/3ML nebulizer solution 3 mL  3 mL Nebulization TID Nita Sells, MD   3 mL at 03/16/18 0859  . nicotine (NICODERM CQ - dosed in mg/24 hours) patch 21 mg  21 mg Transdermal Daily Nita Sells, MD   21 mg at 03/16/18 1015  . polyethylene glycol (MIRALAX / GLYCOLAX) packet 17 g  17 g Oral Daily PRN Nita Sells, MD      . potassium chloride  SA (K-DUR,KLOR-CON) CR tablet 40 mEq  40 mEq Oral Q4H Sheikh, Omair [place], DO   40 mEq at 03/16/18 1239  . rosuvastatin (CRESTOR) tablet 20 mg  20 mg Oral QPM Nita Sells, MD   20 mg at 03/15/18 1754  . senna-docusate (Senokot-S) tablet 1 tablet  1 tablet Oral BID Nita Sells, MD   1 tablet at 03/16/18 1015  . sorbitol 70 % solution 30 mL  30 mL Oral Daily PRN Nita Sells, MD         Discharge Medications: Please see discharge summary for a list of discharge medications.  Relevant Imaging Results:  Relevant Lab Results:   Additional Information SS# [id number]. Has immunotherapy infusion appointments at [facility]. Needs palliative care to follow at facility  [facility], LCSW

## 2018-03-16 NOTE — Progress Notes (Signed)
PT Cancellation Note  Patient Details Name: Jill [name] MRN: [id number] DOB: 1960-11-23   Cancelled Treatment:     pt declined any OOB attempts no specific reason other than "they going to send me to Rehab".  Pt has been evaluated with rec for SNF   Rica Koyanagi  PTA Acute  Rehabilitation Services Pager      [phone] Office      [phone]

## 2018-03-16 NOTE — Progress Notes (Signed)
PROGRESS NOTE    Jill [name]  GQQ:[id number] DOB: [birth year] DOA: 03/15/2018 PCP: Aletha Halim., PA-C   Brief Narrative:  HPI per Dr. Nita Sells 57 year old female with stage IV cancer on Keytruda followed by Dr. Earlie Server though smoker, HTN, schizophrenia Last admission to the hospital 01/13/2018 with acute respiratory failure hypoxia secondary to postobstructive pneumonia complicated by compression fracture T7 vertebra Can do that underwent stereotactic radiation therapy under the care of Dr. Sherwood Gambler and radiation oncologist Dr. Lisbeth Renshaw--- has also been seen by palliative care for uncontrolled pain --on a fentanyl patch as well as oxycodone  CXR not done, UA not done, feeling weak and came back to emergency room Appears that the patient was seen by palliative care and has decided to forego [facility]--- is unclear at this time whether Dr. Julien Nordmann is aware or not of this discussion--- patient is scheduled for his second cycle of Keytruda today  Patient states she came to the emergency room because of left leg pain-she states that she has not really been able to ambulate with her cane that she uses at home and has had further debility because of uncontrolled left shoulder pain from her metastases--it appears that she is getting XRT to the left shoulder She has not had any falls or any other issues  She states that the oxycodone is not working and is requesting by name Dilaudid-she is on fentanyl patch which she is using currently  She states that she has had maybe a little bit of burning in her urine but no hematuria  **Still complaining of Pain in Legs. 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 2 mg yesterday -Palliative Consulted and adjusting Pain  Regimen -Dr. Domingo Cocking started the patient on fentanyl 25 mcg patch and increase to 50 mcg every 72 hours -Dr. Idamae Schuller also started patient on hydromorphone 2 to 4 mg p.o. q. 3 PRN for moderate pain and severe pain and then 1 mg IV daily PRN cycle and pain medication after 40 minutes of oral Dilaudid if oral medications and effective in relieving pain -Continue to adjust pain medication as necessary appreciate palliative input  Stage IV metastatic lung cancer diagnosed May 2019  -on XRT to left shoulder with distant mets- -Scheduled for Keytruda today we will cc Dr. Kaleen Mask input -Rad Onc Following; Per Worthy Flank Note: "We will likely need to treat her left hip as well and will order plain films to this site. We will have to re-assess her status overall as well with her spine, and will likely need to reimage her soon. Fortunately SRS can occur in the setting of ongoing [place], so we will likely be in the midst of her 1st or 2nd cycle when we can proceed." -Change breathing treatments to ipratropium/Xopenex -Bowel regimen with senna docusate 1 tab p.o. twice daily, sorbitol 30 mils p.o. daily PRN moderate constipation and MiraLAX 17 g p.o. daily for mild constipation  Diffuse interstitial opacities suspicious for edema -Seen on CXR -Would not treat at this time  Metabolic Acidosis -Unclear cause but improved -BMET this AM showed a CO2 of 25  Sinus Tachycardia -Patient's heart rate is 135 and possibly pain related -Given 1 L of normal saline bolus -Check EKG -Change duo nebs to Xopenex/Atrovent -Continue  pain control -If not improving will place on telemetry and possibly consider beta-blocker  Hypokalemia -Patient's potassium was 2.4 -Replete with p.o. potassium chloride 40 mL -Continue monitor and replete as necessary -Repeat CMP in a.m.  Tobacco Abuse -Patient states she quit -Continue with Nicotine Patch  History of Schizophrenia -Continue with Haldol 5 mg  p.o. twice daily  DVT prophylaxis: Enoxaparin 40 mg sq q24h Code Status: FULL CODE Family Communication: No family present at bedside Disposition Plan: SNF when medically stable   Consultants:   Palliative Care Medicine  Radiation Oncology  Medical Oncology    Procedures: None  Antimicrobials:  Anti-infectives (From admission, onward)   None     Subjective: And examined at bedside was still complaining of significant pain and states that she did not sleep very well because of it.  Complains of leg pain.  No chest pain, shortness of breath, lightheadedness or dizziness.  No other concerns or complaints at this time.  Objective: Vitals:   03/16/18 0538 03/16/18 0859 03/16/18 0901 03/16/18 1314  BP: 119/78   106/82  Pulse: (!) 136  (!) 127 (!) 135  Resp: (!) 24   15  Temp: 98.9 F (37.2 C)   98 F (36.7 C)  TempSrc: Oral   Oral  SpO2: 91% 93% 96% 90%    Intake/Output Summary (Last 24 hours) at 03/16/2018 1906 Last data filed at 03/16/2018 1700 Gross per 24 hour  Intake 840 ml  Output 850 ml  Net -10 ml   There were no vitals filed for this visit.  Examination: Physical Exam:  Constitutional: WN/WD overweight Caucasian female in NAD appears calm but uncomfortable Eyes: Lids and conjunctivae normal, sclerae anicteric  ENMT: External Ears, Nose appear normal. Grossly normal hearing. Mucous membranes are moist.  Neck: Appears normal, supple, no cervical masses, normal ROM, no appreciable thyromegaly; no JVD Respiratory: Diminished to auscultation bilaterally, no wheezing, rales, rhonchi or crackles. Normal respiratory effort and patient is not tachypenic. No accessory muscle use.  Cardiovascular: Tachycardic Rate but regular rhythm, no murmurs / rubs / gallops. No extremity edema. 2+ pedal pulses. No carotid bruits.  Abdomen: Soft, non-tender, slightly-distended. No masses palpated. No appreciable hepatosplenomegaly. Bowel sounds positive x4.  GU:  Deferred. Musculoskeletal: No clubbing / cyanosis of digits/nails. Normal strength and muscle tone.  Skin: No rashes, lesions, ulcers on a limited skin eval. No induration; Warm and dry.  Neurologic: CN 2-12 grossly intact with no focal deficits.  Romberg sign and cerebellar reflexes not assessed.  Psychiatric: Normal judgment and insight. Alert and oriented x 3. Anxious mood and appropriate affect.   Data Reviewed: I have personally reviewed following labs and imaging studies  CBC: Recent Labs  Lab 03/15/18 0327 03/15/18 1111 03/16/18 0900  WBC 6.8 6.8 6.3  NEUTROABS 5.7  --  5.0  HGB 13.7 13.4 12.6  HCT 41.3 41.0 39.0  MCV 87.5 88.4 89.0  PLT 278 346 373   Basic Metabolic Panel: Recent Labs  Lab 03/15/18 0327 03/15/18 1111 03/16/18 0900  NA 138  --  139  K 4.5  --  3.4*  CL 107  --  105  CO2 18*  --  25  GLUCOSE 102*  --  104*  BUN 11  --  8  CREATININE 0.40* 0.39* 0.30*  CALCIUM 9.4  --  9.5  MG  --   --  1.7  PHOS  --   --  2.9   GFR: CrCl cannot be calculated (Unknown ideal weight.). Liver  Function Tests: Recent Labs  Lab 03/16/18 0900  AST 21  ALT 20  ALKPHOS 178*  BILITOT 0.7  PROT 5.8*  ALBUMIN 2.7*   No results for input(s): LIPASE, AMYLASE in the last 168 hours. No results for input(s): AMMONIA in the last 168 hours. Coagulation Profile: No results for input(s): INR, PROTIME in the last 168 hours. Cardiac Enzymes: No results for input(s): CKTOTAL, CKMB, CKMBINDEX, TROPONINI in the last 168 hours. BNP (last 3 results) No results for input(s): PROBNP in the last 8760 hours. HbA1C: No results for input(s): HGBA1C in the last 72 hours. CBG: No results for input(s): GLUCAP in the last 168 hours. Lipid Profile: No results for input(s): CHOL, HDL, LDLCALC, TRIG, CHOLHDL, LDLDIRECT in the last 72 hours. Thyroid Function Tests: No results for input(s): TSH, T4TOTAL, FREET4, T3FREE, THYROIDAB in the last 72 hours. Anemia Panel: No results for  input(s): VITAMINB12, FOLATE, FERRITIN, TIBC, IRON, RETICCTPCT in the last 72 hours. Sepsis Labs: No results for input(s): PROCALCITON, LATICACIDVEN in the last 168 hours.  No results found for this or any previous visit (from the past 240 hour(s)).   Radiology Studies: Dg Chest 2 View  Result Date: 03/15/2018 CLINICAL DATA:  Cough. EXAM: CHEST - 2 VIEW COMPARISON:  Radiographs and CT 01/06/2018 FINDINGS: Tip of the right central line in the mid SVC. Progressive interstitial opacities from prior exam suspicious for pulmonary edema. Mild volume loss in the right lung. Right lower lobe mass on prior CT is not well seen radiographically. Unchanged heart size and mediastinal contours. No pneumothorax or large pleural effusion. IMPRESSION: 1. Diffuse interstitial opacities suspicious for pulmonary edema. 2. Right lung volume loss. Right lower lobe mass on prior CT not well seen radiographically. 3. Tip of the right upper extremity PICC in the SVC. Electronically Signed   By: Keith Rake M.D.   On: 03/15/2018 07:41   Dg Chest Port 1 View  Result Date: 03/15/2018 CLINICAL DATA:  Shortness of Breath EXAM: PORTABLE CHEST 1 VIEW COMPARISON:  March 15, 2018 study obtained earlier in the day. Chest CT January 07, 2018 FINDINGS: Central catheter tip is in the superior vena cava. There is airspace opacity in the right lower lobe with consolidation in the medial right base, stable. Previous mass in the right perihilar region seen on CT is not well seen by radiography. There is slight underlying interstitial edema. Heart is mildly enlarged with slight pulmonary venous hypertension. No adenopathy is appreciable by radiography. There is an expansile lesion involving the posterior left second rib with soft tissue opacity in the left apex. Patient appears to have had kyphoplasty at T7. Lytic lesion noted in the lateral left clavicle. IMPRESSION: Question pneumonia in the right base with fibrosis, possibly due to a  degree of previous radiation therapy. Appearance stable compared to earlier in the day. Pulmonary vascular congestion with mild interstitial edema, stable. Central catheter tip in superior vena cava.  No pneumothorax. Expansile lytic lesion involving the left posterior second rib. A lytic lesion is also noted in the lateral left clavicle. These are lesions felt to be consistent with metastatic foci. Electronically Signed   By: Lowella Grip III M.D.   On: 03/15/2018 08:52   Dg Femur Min 2 Views Left  Result Date: 03/15/2018 CLINICAL DATA:  Known left femur metastasis. EXAM: LEFT FEMUR 2 VIEWS COMPARISON:  None. FINDINGS: 3.3 cm lytic lesion in the proximal femoral metaphysis is compatible with metastatic involvement. No additional femoral lesions evident. No fracture. IMPRESSION: 3.3  cm metastatic lesion proximal left femoral metaphysis. Electronically Signed   By: Misty Stanley M.D.   On: 03/15/2018 20:35   Scheduled Meds: . enoxaparin (LOVENOX) injection  40 mg Subcutaneous Q24H  . fentaNYL  50 mcg Transdermal Q72H  . haloperidol  5 mg Oral BID  . ipratropium  0.5 mg Nebulization TID  . levalbuterol  0.63 mg Nebulization TID  . nicotine  21 mg Transdermal Daily  . rosuvastatin  20 mg Oral QPM  . senna-docusate  1 tablet Oral BID   Continuous Infusions:   LOS: 1 day   Kerney Elbe, DO [facility] PAGER is on [place]  If 7PM-7AM, please contact night-coverage [email or web address] Password [facility] 03/16/2018, 7:07 PM

## 2018-03-17 DIAGNOSIS — C3490 Malignant neoplasm of unspecified part of unspecified bronchus or lung: Secondary | ICD-10-CM

## 2018-03-17 DIAGNOSIS — K5900 Constipation, unspecified: Secondary | ICD-10-CM

## 2018-03-17 DIAGNOSIS — R000 Tachycardia, unspecified: Secondary | ICD-10-CM

## 2018-03-17 LAB — CBC WITH DIFFERENTIAL/PLATELET
BASOS PCT: 0 %
Basophils Absolute: 0 10*3/uL (ref 0.0–0.1)
EOS ABS: 0.1 10*3/uL (ref 0.0–0.7)
EOS PCT: 1 %
HCT: 37.5 % (ref 36.0–46.0)
Hemoglobin: 11.9 g/dL — ABNORMAL LOW (ref 12.0–15.0)
Lymphocytes Relative: 10 %
Lymphs Abs: 0.6 10*3/uL — ABNORMAL LOW (ref 0.7–4.0)
MCH: 28.7 pg (ref 26.0–34.0)
MCHC: 31.7 g/dL (ref 30.0–36.0)
MCV: 90.4 fL (ref 78.0–100.0)
MONOS PCT: 9 %
Monocytes Absolute: 0.5 10*3/uL (ref 0.1–1.0)
Neutro Abs: 4.8 10*3/uL (ref 1.7–7.7)
Neutrophils Relative %: 80 %
PLATELETS: 307 10*3/uL (ref 150–400)
RBC: 4.15 MIL/uL (ref 3.87–5.11)
RDW: 17.6 % — ABNORMAL HIGH (ref 11.5–15.5)
WBC: 6 10*3/uL (ref 4.0–10.5)

## 2018-03-17 LAB — COMPREHENSIVE METABOLIC PANEL
ALBUMIN: 2.6 g/dL — AB (ref 3.5–5.0)
ALT: 20 U/L (ref 0–44)
ANION GAP: 9 (ref 5–15)
AST: 20 U/L (ref 15–41)
Alkaline Phosphatase: 155 U/L — ABNORMAL HIGH (ref 38–126)
BUN: 6 mg/dL (ref 6–20)
CHLORIDE: 105 mmol/L (ref 98–111)
CO2: 26 mmol/L (ref 22–32)
Calcium: 9.6 mg/dL (ref 8.9–10.3)
Creatinine, Ser: 0.31 mg/dL — ABNORMAL LOW (ref 0.44–1.00)
GFR calc non Af Amer: >60 mL/min (ref >60)
Glucose, Bld: 101 mg/dL — ABNORMAL HIGH (ref 70–99)
Potassium: 4.1 mmol/L (ref 3.5–5.1)
Sodium: 140 mmol/L (ref 135–145)
Total Bilirubin: 0.4 mg/dL (ref 0.3–1.2)
Total Protein: 5.8 g/dL — ABNORMAL LOW (ref 6.5–8.1)

## 2018-03-17 LAB — GLUCOSE, CAPILLARY: Glucose-Capillary: 92 mg/dL (ref 70–99)

## 2018-03-17 LAB — PHOSPHORUS: Phosphorus: 3.2 mg/dL (ref 2.5–4.6)

## 2018-03-17 LAB — MAGNESIUM: Magnesium: 1.7 mg/dL (ref 1.7–2.4)

## 2018-03-17 MED ORDER — MAGNESIUM SULFATE 2 GM/50ML IV SOLN
2.0000 g | Freq: Once | INTRAVENOUS | Status: AC
  Administered 2018-03-17: 2 g via INTRAVENOUS
  Filled 2018-03-17 (×2): qty 50

## 2018-03-17 MED ORDER — GUAIFENESIN ER 600 MG PO TB12
1200.0000 mg | ORAL_TABLET | Freq: Two times a day (BID) | ORAL | Status: DC
  Administered 2018-03-17 – 2018-03-22 (×11): 1200 mg via ORAL
  Filled 2018-03-17 (×11): qty 2

## 2018-03-17 MED ORDER — SODIUM CHLORIDE 0.9 % IV BOLUS
1000.0000 mL | Freq: Once | INTRAVENOUS | Status: AC
  Administered 2018-03-17: 1000 mL via INTRAVENOUS

## 2018-03-17 MED ORDER — METOPROLOL TARTRATE 5 MG/5ML IV SOLN
5.0000 mg | Freq: Four times a day (QID) | INTRAVENOUS | Status: DC | PRN
  Administered 2018-03-17 – 2018-03-18 (×3): 5 mg via INTRAVENOUS
  Filled 2018-03-17 (×3): qty 5

## 2018-03-17 NOTE — Progress Notes (Signed)
PROGRESS NOTE    Jill [name]  OVF:[id number] DOB: 09-04-1960 DOA: 03/15/2018 PCP: Aletha Halim., PA-C   Brief Narrative:  HPI per Dr. Nita Sells 57 year old female with stage IV cancer on Keytruda followed by Dr. Earlie Server though smoker, HTN, schizophrenia Last admission to the hospital 01/13/2018 with acute respiratory failure hypoxia secondary to postobstructive pneumonia complicated by compression fracture T7 vertebra Can do that underwent stereotactic radiation therapy under the care of Dr. Sherwood Gambler and radiation oncologist Dr. Lisbeth Renshaw--- has also been seen by palliative care for uncontrolled pain --on a fentanyl patch as well as oxycodone  CXR not done, UA not done, feeling weak and came back to emergency room Appears that the patient was seen by palliative care and has decided to forego [facility]--- is unclear at this time whether Dr. Julien Nordmann is aware or not of this discussion--- patient is scheduled for his second cycle of Keytruda today  Patient states she came to the emergency room because of left leg pain-she states that she has not really been able to ambulate with her cane that she uses at home and has had further debility because of uncontrolled left shoulder pain from her metastases--it appears that she is getting XRT to the left shoulder She has not had any falls or any other issues  She states that the oxycodone is not working and is requesting by name Dilaudid-she is on fentanyl patch which she is using currently  She states that she has had maybe a little bit of burning in her urine but no hematuria  **Still complaining of Pain in Legs and shoulder. Underwent Radiation Therapy yesterday to the Left Shoulder. Pain regimen continually being adjusted by Palliative Care. Hospitalization complicated by Sinus Tachycardia.   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 Domingo Cocking started the patient on fentanyl 25 mcg patch and increase to 50 mcg every 72 hours -Dr. Domingo Cocking also started patient on hydromorphone 2 to 4 mg p.o. q. 3 PRN for moderate pain and severe pain and then 1 mg IV daily PRN cycle and pain medication after 40 minutes of oral Dilaudid if oral medications and effective in relieving pain -She had 26 mg po Dilaudid in the last 24 hours but Dr. Domingo Cocking to continue current regimen today  -Continue to adjust pain medication as necessary appreciate palliative input  Stage IV metastatic lung cancer diagnosed May 2019  -On XRT to left shoulder with distant mets; Had Radiation yesterday  -Scheduled for Keytruda today we will cc Dr. Kaleen Mask input but Dr. Earlie Server yet to see the patient  -Rad Onc Following; Per Worthy Flank Note: "We will likely need to treat her left hip as well and will order plain films to this site. We will have to re-assess her status overall as well with her spine, and will likely need to reimage her soon. Fortunately SRS can occur in the setting of ongoing [place], so we will likely be in the midst of her 1st or 2nd cycle when we can proceed." -Change breathing treatments to Ipratropium/Xopenex due to Sinus Tachycardia  -Bowel regimen with senna docusate 1 tab p.o. twice daily, sorbitol 30 mils p.o. daily PRN moderate constipation and MiraLAX 17 g p.o. daily for mild constipation; Has had a bowel movement today   Diffuse interstitial opacities suspicious for edema -Seen on CXR  on 03/15/18 -Would not treat at this time  Metabolic Acidosis -Unclear cause but improved -BMET this AM showed a CO2 of 26  Sinus Tachycardia -Patient's heart rate remains elevated despite current pain control regimen -Given 1 L of normal saline bolus today and 1 was given yesterday  -Check EKG and showed Sinus Tachycardia at a rate of 121 with no evidence of ST  Elevation on my interpretation  -Change duo nebs to Xopenex/Atrovent -Continue pain control as above -Check TSH and Free T4 -Moved to Telemetry and started IV Lopressor 5 mg q6hprn for HR>130; Continue to Monitor closely   Hypokalemia -Patient's potassium was 3.4 and improved to 4.1 -Mag Level was 1.7 -Continue monitor and replete as necessary -Repeat CMP in a.m.  Tobacco Abuse -Patient states she quit -Continue with Nicotine Patch  History of Schizophrenia -Continue with Haldol 5 mg p.o. twice daily  DVT prophylaxis: Enoxaparin 40 mg sq q24h Code Status: FULL CODE Family Communication: No family present at bedside Disposition Plan: SNF when medically stable   Consultants:   Palliative Care Medicine  Radiation Oncology  Medical Oncology    Procedures: None  Antimicrobials:  Anti-infectives (From admission, onward)   None     Subjective: Seen and examined at bedside was still complaining of pain but not as much. Underwent XRT yesterday to the Left shoulder.  Her biggest complaint was mild dyspnea and shortness of breath and states she does not feel her heart rate going that fast.  No other concerns or complaints at this time.  Objective: Vitals:   03/17/18 0439 03/17/18 0558 03/17/18 0936 03/17/18 1341  BP: 112/82   122/85  Pulse: (!) 128   (!) 116  Resp: 16     Temp:    97.9 F (36.6 C)  TempSrc: Oral     SpO2: (!) 85% 90% (!) 88% 92%    Intake/Output Summary (Last 24 hours) at 03/17/2018 1500 Last data filed at 03/17/2018 1232 Gross per 24 hour  Intake 1530 ml  Output -  Net 1530 ml   There were no vitals filed for this visit.  Examination: Physical Exam:  Constitutional: Well-nourished, well-developed overweight Caucasian female who is currently in no acute distress but appears somewhat more comfortable than yesterday Eyes: Lids and conjunctive are normal.  Sclera anicteric ENMT: External ears and nose appear normal. mucous members are moist Neck:  Appears supple no JVD Respiratory: Diminished to auscultation bilaterally with no appreciable wheezing, rales, rhonchi.  Has slightly coarse breath sounds but had a normal respiratory effort and was not tachypneic Cardiovascular: Tachycardic rate but regular rhythm.  No appreciable murmurs, rubs, gallops.  No lower extremity edema noted Abdomen: Soft, nontender, slightly distended.  Bowel sounds present in 4 quadrants GU: Deferred Musculoskeletal: No contractures or cyanosis.  Skin: Skin is warm and dry no appreciable rashes or lesions limited skin evaluation Neurologic: Cranial nerves II through XII grossly intact no appreciable focal deficits.  Romberg sign and cerebellar reflexes were not assessed Psychiatric: Intact.  Patient is awake and alert and oriented x3.  Slightly anxious but appropriate affect  Data Reviewed: I have personally reviewed following labs and imaging studies  CBC: Recent Labs  Lab 03/15/18 0327 03/15/18 1111 03/16/18 0900 03/17/18 0500  WBC 6.8 6.8 6.3 6.0  NEUTROABS 5.7  --  5.0 4.8  HGB 13.7 13.4 12.6 11.9*  HCT 41.3 41.0 39.0 37.5  MCV 87.5 88.4 89.0 90.4  PLT 278 346 327 010   Basic Metabolic Panel: Recent Labs  Lab 03/15/18 0327 03/15/18 1111 03/16/18 0900 03/17/18 0500  NA 138  --  139 140  K 4.5  --  3.4* 4.1  CL 107  --  105 105  CO2 18*  --  25 26  GLUCOSE 102*  --  104* 101*  BUN 11  --  8 6  CREATININE 0.40* 0.39* 0.30* 0.31*  CALCIUM 9.4  --  9.5 9.6  MG  --   --  1.7 1.7  PHOS  --   --  2.9 3.2   GFR: CrCl cannot be calculated (Unknown ideal weight.). Liver Function Tests: Recent Labs  Lab 03/16/18 0900 03/17/18 0500  AST 21 20  ALT 20 20  ALKPHOS 178* 155*  BILITOT 0.7 0.4  PROT 5.8* 5.8*  ALBUMIN 2.7* 2.6*   No results for input(s): LIPASE, AMYLASE in the last 168 hours. No results for input(s): AMMONIA in the last 168 hours. Coagulation Profile: No results for input(s): INR, PROTIME in the last 168 hours. Cardiac  Enzymes: No results for input(s): CKTOTAL, CKMB, CKMBINDEX, TROPONINI in the last 168 hours. BNP (last 3 results) No results for input(s): PROBNP in the last 8760 hours. HbA1C: No results for input(s): HGBA1C in the last 72 hours. CBG: No results for input(s): GLUCAP in the last 168 hours. Lipid Profile: No results for input(s): CHOL, HDL, LDLCALC, TRIG, CHOLHDL, LDLDIRECT in the last 72 hours. Thyroid Function Tests: No results for input(s): TSH, T4TOTAL, FREET4, T3FREE, THYROIDAB in the last 72 hours. Anemia Panel: No results for input(s): VITAMINB12, FOLATE, FERRITIN, TIBC, IRON, RETICCTPCT in the last 72 hours. Sepsis Labs: No results for input(s): PROCALCITON, LATICACIDVEN in the last 168 hours.  No results found for this or any previous visit (from the past 240 hour(s)).   Radiology Studies: Dg Femur Min 2 Views Left  Result Date: 03/15/2018 CLINICAL DATA:  Known left femur metastasis. EXAM: LEFT FEMUR 2 VIEWS COMPARISON:  None. FINDINGS: 3.3 cm lytic lesion in the proximal femoral metaphysis is compatible with metastatic involvement. No additional femoral lesions evident. No fracture. IMPRESSION: 3.3 cm metastatic lesion proximal left femoral metaphysis. Electronically Signed   By: Misty Stanley M.D.   On: 03/15/2018 20:35   Scheduled Meds: . enoxaparin (LOVENOX) injection  40 mg Subcutaneous Q24H  . fentaNYL  50 mcg Transdermal Q72H  . guaiFENesin  1,200 mg Oral BID  . haloperidol  5 mg Oral BID  . ipratropium  0.5 mg Nebulization TID  . levalbuterol  0.63 mg Nebulization TID  . nicotine  21 mg Transdermal Daily  . rosuvastatin  20 mg Oral QPM  . senna-docusate  1 tablet Oral BID   Continuous Infusions:   LOS: 2 days   Kerney Elbe, DO [facility] PAGER is on AMION  If 7PM-7AM, please contact night-coverage [email or web address] Password [email or web address] 03/17/2018, 3:00 PM

## 2018-03-17 NOTE — Progress Notes (Signed)
PT demonstrated hands on understanding of Flutter device. 

## 2018-03-17 NOTE — Progress Notes (Signed)
Daily Progress Note   Patient Name: Jill [name]       Date: 03/17/2018 DOB: 19-Aug-1960  Age: 57 y.o. MRN#: [id number] Attending Physician: Kerney Elbe, DO Primary Care Physician: Aletha Halim., PA-C Admit Date: 03/15/2018  Reason for Consultation/Follow-up: Establishing goals of care and Pain control  Subjective: I met today with Jill [name].  She continues to endorse that her pain has been better controlled since changing to dilaudid from oxycodone but still not at her goal for pain.   Chart reviewed and she has had 26 mg of PO dilaudid in the last 24 hours.  Reports having BM today.  Length of Stay: 2  Current Medications: Scheduled Meds:  . enoxaparin (LOVENOX) injection  40 mg Subcutaneous Q24H  . fentaNYL  50 mcg Transdermal Q72H  . haloperidol  5 mg Oral BID  . ipratropium  0.5 mg Nebulization TID  . levalbuterol  0.63 mg Nebulization TID  . nicotine  21 mg Transdermal Daily  . rosuvastatin  20 mg Oral QPM  . senna-docusate  1 tablet Oral BID    Continuous Infusions: . sodium chloride 1,000 mL (03/17/18 1232)    PRN Meds: acetaminophen, HYDROmorphone (DILAUDID) injection, HYDROmorphone, metoprolol tartrate, polyethylene glycol, sorbitol  Physical Exam      General: Alert, awake, in no acute distress. Appears older that stated age. HEENT: No bruits, no goiter, no JVD Heart: Regular rate and rhythm. No murmur appreciated. Lungs: Good air movement, clear Abdomen: Soft, nontender, nondistended, positive bowel sounds.  Ext: No significant edema Skin: Warm and dry Neuro: Grossly intact, nonfocal.     Vital Signs: BP 112/82 (BP Location: Left Arm)   Pulse (!) 128 Comment: RN notified  Temp 98 F (36.7 C) (Oral)   Resp 16   SpO2 (!) 88% Comment: had  3 lpm running but not up nose SpO2: SpO2: (!) 88 %(had 3 lpm running but not up nose) O2 Device: O2 Device: Room Air O2 Flow Rate: O2 Flow Rate (L/min): 2 L/min  Intake/output summary:   Intake/Output Summary (Last 24 hours) at 03/17/2018 1246 Last data filed at 03/17/2018 0953 Gross per 24 hour  Intake 480 ml  Output -  Net 480 ml   LBM: Last BM Date: 03/16/18 Baseline Weight:   Most recent weight:  Palliative Assessment/Data:    Flowsheet Rows     Most Recent Value  Intake Tab  Referral Department  Hospitalist  Unit at Time of Referral  Oncology Unit  Palliative Care Primary Diagnosis  Cancer  Date Notified  03/15/18  Palliative Care Type  Return patient Palliative Care  Reason for referral  Pain  Date of Admission  03/15/18  Date first seen by Palliative Care  03/15/18  # of days Palliative referral response time  0 Day(s)  # of days IP prior to Palliative referral  0  Clinical Assessment  Palliative Performance Scale Score  40%  Pain Max last 24 hours  10  Pain Min Last 24 hours  7  Psychosocial & Spiritual Assessment  Palliative Care Outcomes  Patient/Family meeting held?  Yes  Palliative Care Outcomes  Improved pain interventions, Clarified goals of care      Patient Active Problem List   Diagnosis Date Noted  . Weakness 03/15/2018  . Cancer related pain   . Palliative care by specialist   . Metastatic lung carcinoma, unspecified laterality ([place])   . Bone metastasis ([place]) 01/13/2018  . HCAP (healthcare-associated pneumonia) 01/07/2018  . Tachycardia 01/07/2018  . Encounter for antineoplastic immunotherapy 12/12/2017  . DNR (do not resuscitate) discussion 12/12/2017  . Primary malignant neoplasm of bronchus of right lower lobe ([place]) 11/17/2017  . Brain metastases ([place]) 11/14/2017  . Sepsis due to pneumonia ([place]) 11/13/2017  . Hemoptysis 11/13/2017  . Hypoxia 11/13/2017  . Metastatic lung cancer (metastasis from lung to other site), right ([place])  11/13/2017  . Chronic migraine without aura 11/13/2017  . OBESITY, NOS 08/31/2006  . Schizophrenia ([place]) 08/31/2006  . TOBACCO DEPENDENCE 08/31/2006  . HYPERTENSION, BENIGN SYSTEMIC 08/31/2006  . MENOPAUSAL SYNDROME 08/31/2006  . HYDRADENITIS 08/31/2006    Palliative Care Assessment & Plan   Patient Profile: 57 y.o. female  with past medical history of stage IV cancer on Keytruda and limited radiation, HTN, schizophrenia, postobstructive PNA, compression fracture admitted on 03/15/2018 with cancer related pain who had prior stated she would like hospice support but is now stating that she is interested in further immunotherapy.   Assessment: Patient Active Problem List   Diagnosis Date Noted  . Weakness 03/15/2018  . Cancer related pain   . Palliative care by specialist   . Metastatic lung carcinoma, unspecified laterality ([place])   . Bone metastasis ([place]) 01/13/2018  . HCAP (healthcare-associated pneumonia) 01/07/2018  . Tachycardia 01/07/2018  . Encounter for antineoplastic immunotherapy 12/12/2017  . DNR (do not resuscitate) discussion 12/12/2017  . Primary malignant neoplasm of bronchus of right lower lobe ([place]) 11/17/2017  . Brain metastases ([place]) 11/14/2017  . Sepsis due to pneumonia ([place]) 11/13/2017  . Hemoptysis 11/13/2017  . Hypoxia 11/13/2017  . Metastatic lung cancer (metastasis from lung to other site), right ([place]) 11/13/2017  . Chronic migraine without aura 11/13/2017  . OBESITY, NOS 08/31/2006  . Schizophrenia ([place]) 08/31/2006  . TOBACCO DEPENDENCE 08/31/2006  . HYPERTENSION, BENIGN SYSTEMIC 08/31/2006  . MENOPAUSAL SYNDROME 08/31/2006  . HYDRADENITIS 08/31/2006    Recommendations/Plan:  Pain: Increased fentanyl to 27mg/hr yesterday.  Continue dilaudid 440mPO every 3 hours as needed.  Also has backup dilaudid 5m7mV to be given 40 minutes after oral medication if oral route is ineffective.  I think that she is likely to need continued titration of her pain  medications, but is sleeping comfortably on my arrival to room today.  As patch just increased yesterday, continue same for now.  She is also pursuing radiation which is better long term treatment than increasing opioids.  Consider steroids, but would first see oncology opinion of this as she is getting immunotherapy and sometimes preference to avoid steroids.  Goals of Care and Additional Recommendations:  Limitations on Scope of Treatment: Full Scope Treatment  Code Status:    Code Status Orders  (From admission, onward)         Start     Ordered   03/15/18 0808  Full code  Continuous     03/15/18 0808        Code Status History    Date Active Date Inactive Code Status Order ID Comments User Context   01/07/2018 0201 01/13/2018 2127 Full Code [id number]  Jani Gravel, MD ED   11/13/2017 0112 11/18/2017 1705 Full Code [id number]  Norval Morton, MD ED       Prognosis:   Unable to determine  Discharge Planning:  [place] for rehab with Palliative care service follow-up  Care plan was discussed with patient.  Bedside RN  Thank you for allowing the Palliative Medicine Team to assist in the care of this patient.   Total Time 30 Prolonged Time Billed No      Greater than 50%  of this time was spent counseling and coordinating care related to the above assessment and plan.  Micheline Rough, MD  Please contact Palliative Medicine Team phone at [phone] for questions and concerns.

## 2018-03-18 ENCOUNTER — Inpatient Hospital Stay (HOSPITAL_COMMUNITY): Payer: Medicare Other

## 2018-03-18 DIAGNOSIS — J9601 Acute respiratory failure with hypoxia: Secondary | ICD-10-CM

## 2018-03-18 DIAGNOSIS — E876 Hypokalemia: Secondary | ICD-10-CM

## 2018-03-18 LAB — CBC WITH DIFFERENTIAL/PLATELET
BASOS ABS: 0 10*3/uL (ref 0.0–0.1)
BASOS PCT: 0 %
EOS ABS: 0.1 10*3/uL (ref 0.0–0.7)
EOS PCT: 2 %
HCT: 35.6 % — ABNORMAL LOW (ref 36.0–46.0)
Hemoglobin: 11.2 g/dL — ABNORMAL LOW (ref 12.0–15.0)
Lymphocytes Relative: 11 %
Lymphs Abs: 0.6 10*3/uL — ABNORMAL LOW (ref 0.7–4.0)
MCH: 28.6 pg (ref 26.0–34.0)
MCHC: 31.5 g/dL (ref 30.0–36.0)
MCV: 90.8 fL (ref 78.0–100.0)
MONO ABS: 0.5 10*3/uL (ref 0.1–1.0)
Monocytes Relative: 9 %
Neutro Abs: 4.1 10*3/uL (ref 1.7–7.7)
Neutrophils Relative %: 78 %
PLATELETS: 286 10*3/uL (ref 150–400)
RBC: 3.92 MIL/uL (ref 3.87–5.11)
RDW: 17.6 % — AB (ref 11.5–15.5)
WBC: 5.3 10*3/uL (ref 4.0–10.5)

## 2018-03-18 LAB — COMPREHENSIVE METABOLIC PANEL
ALT: 19 U/L (ref 0–44)
AST: 20 U/L (ref 15–41)
Albumin: 2.6 g/dL — ABNORMAL LOW (ref 3.5–5.0)
Alkaline Phosphatase: 140 U/L — ABNORMAL HIGH (ref 38–126)
Anion gap: 7 (ref 5–15)
BUN: 5 mg/dL — ABNORMAL LOW (ref 6–20)
CHLORIDE: 106 mmol/L (ref 98–111)
CO2: 28 mmol/L (ref 22–32)
Calcium: 9.4 mg/dL (ref 8.9–10.3)
Creatinine, Ser: 0.31 mg/dL — ABNORMAL LOW (ref 0.44–1.00)
GFR calc Af Amer: >60 mL/min (ref >60)
Glucose, Bld: 104 mg/dL — ABNORMAL HIGH (ref 70–99)
POTASSIUM: 3.6 mmol/L (ref 3.5–5.1)
Sodium: 141 mmol/L (ref 135–145)
Total Bilirubin: 0.6 mg/dL (ref 0.3–1.2)
Total Protein: 5.6 g/dL — ABNORMAL LOW (ref 6.5–8.1)

## 2018-03-18 LAB — PHOSPHORUS: PHOSPHORUS: 3.3 mg/dL (ref 2.5–4.6)

## 2018-03-18 LAB — T4, FREE: FREE T4: 1.09 ng/dL (ref 0.82–1.77)

## 2018-03-18 LAB — MAGNESIUM: MAGNESIUM: 1.9 mg/dL (ref 1.7–2.4)

## 2018-03-18 LAB — TSH: TSH: 1.641 u[IU]/mL (ref 0.350–4.500)

## 2018-03-18 MED ORDER — FENTANYL 100 MCG/HR TD PT72
100.0000 ug | MEDICATED_PATCH | TRANSDERMAL | Status: DC
  Administered 2018-03-18 – 2018-03-21 (×2): 100 ug via TRANSDERMAL
  Filled 2018-03-18 (×2): qty 1

## 2018-03-18 MED ORDER — POTASSIUM CHLORIDE CRYS ER 20 MEQ PO TBCR
40.0000 meq | EXTENDED_RELEASE_TABLET | Freq: Once | ORAL | Status: AC
  Administered 2018-03-18: 40 meq via ORAL
  Filled 2018-03-18: qty 2

## 2018-03-18 MED ORDER — BUDESONIDE 0.25 MG/2ML IN SUSP
0.2500 mg | Freq: Two times a day (BID) | RESPIRATORY_TRACT | Status: DC
  Administered 2018-03-18 – 2018-03-22 (×9): 0.25 mg via RESPIRATORY_TRACT
  Filled 2018-03-18 (×9): qty 2

## 2018-03-18 MED ORDER — METOPROLOL TARTRATE 25 MG PO TABS
25.0000 mg | ORAL_TABLET | Freq: Two times a day (BID) | ORAL | Status: DC
  Administered 2018-03-18 – 2018-03-22 (×7): 25 mg via ORAL
  Filled 2018-03-18 (×8): qty 1

## 2018-03-18 MED ORDER — IOPAMIDOL (ISOVUE-370) INJECTION 76%
100.0000 mL | Freq: Once | INTRAVENOUS | Status: AC | PRN
  Administered 2018-03-18: 100 mL via INTRAVENOUS

## 2018-03-18 MED ORDER — SODIUM CHLORIDE 0.9 % IV BOLUS
1000.0000 mL | Freq: Once | INTRAVENOUS | Status: AC
  Administered 2018-03-18: 1000 mL via INTRAVENOUS

## 2018-03-18 MED ORDER — SODIUM CHLORIDE 0.9 % IV BOLUS
500.0000 mL | Freq: Once | INTRAVENOUS | Status: AC
  Administered 2018-03-18: 500 mL via INTRAVENOUS

## 2018-03-18 MED ORDER — IOPAMIDOL (ISOVUE-370) INJECTION 76%
INTRAVENOUS | Status: AC
  Filled 2018-03-18: qty 100

## 2018-03-18 MED ORDER — SENNOSIDES-DOCUSATE SODIUM 8.6-50 MG PO TABS
2.0000 | ORAL_TABLET | Freq: Two times a day (BID) | ORAL | Status: DC
  Administered 2018-03-18 – 2018-03-22 (×8): 2 via ORAL
  Filled 2018-03-18 (×8): qty 2

## 2018-03-18 NOTE — Progress Notes (Signed)
PROGRESS NOTE    Jill [name]  ZTI:[id number] DOB: 1961/06/09 DOA: 03/15/2018 PCP: Jill [name]., PA-C   Brief Narrative:  HPI per Dr. Nita [name] 57 year old female with stage IV cancer on Keytruda followed by Dr. Earlie [name] though smoker, HTN, schizophrenia Last admission to the hospital 01/13/2018 with acute respiratory failure hypoxia secondary to postobstructive pneumonia complicated by compression fracture T7 vertebra Can do that underwent stereotactic radiation therapy under the care of Dr. Sherwood [name] and radiation oncologist Dr. Lisbeth [name]--- has also been seen by palliative care for uncontrolled pain --on a fentanyl patch as well as oxycodone  CXR not done, UA not done, feeling weak and came back to emergency room Appears that the patient was seen by palliative care and has decided to forego [facility]--- is unclear at this time whether Dr. Julien [name] is aware or not of this discussion--- patient is scheduled for his second cycle of Keytruda today  Patient states she came to the emergency room because of left leg pain-she states that she has not really been able to ambulate with her cane that she uses at home and has had further debility because of uncontrolled left shoulder pain from her metastases--it appears that she is getting XRT to the left shoulder She has not had any falls or any other issues  She states that the oxycodone is not working and is requesting by name Dilaudid-she is on fentanyl patch which she is using currently  She states that she has had maybe a little bit of burning in her urine but no hematuria  **Still complaining of Pain in Legs and shoulder. Underwent Radiation Therapy yesterday to the Left Shoulder. Pain regimen continually being adjusted by Palliative Care. Hospitalization complicated by Sinus Tachycardia and Dyspnea so CTA of the Chest was ordered to r/o PE as D-Dimer would likely be elevated in the setting of Malignancy.   Assessment & Plan:     Active Problems:   Cancer related pain   Weakness  Chronic cachexia and pain related to stage IV cancer in a setting of lung cancer -Palliative Consulted and adjusting Pain Regimen -Dr. Domingo [name] started the patient on fentanyl 25 mcg patch and increase to 50 mcg every 72 hours -Dr. Domingo [name] also started patient on hydromorphone 2 to 4 mg p.o. q. 3 PRN for moderate pain and severe pain and then 1 mg IV daily PRN cycle and pain medication after 40 minutes of oral Dilaudid if oral medications and effective in relieving pain -She had 26 mg po Dilaudid in the last 24 hours but Dr. Domingo [name] to continue current regimen today  -Continue to adjust pain medication as necessary appreciate palliative input  Acute Respiratory Failure with Hypoxia -Was hospitalized last time 2/2 to Postobstructive PNA complicated by Compression Fx -Continue Supplemental O2 via [place] and wean as tolerated -Continuous Pulse Oximetry and Maintain O2 Sats >92% -CXR on 03/15/18: Question pneumonia in the right base with fibrosis, possibly due to a degree of previous radiation therapy. Appearance stable compared to earlier in the day. Pulmonary vascular congestion with mild interstitial edema, stable. Central catheter tip in superior vena cava.  No pneumothorax. Expansile lytic lesion involving the left posterior second rib. A lytic lesion is also noted in the lateral left clavicle. These are lesions felt to be consistent with metastatic foci. -Check CTA Chest to r/o PE and evaluate if patient has a PNA -C/w Guaifenesin, Flutter Valve, Incentive Spirometry -Breathing Treatments with Xopenex/Atrovent scheduled and added Budesonide today -Continue to monitor respiratory status and repeat  chest x-ray in a.m.  Stage IV metastatic lung cancer diagnosed May 2019  -On XRT to left shoulder with distant mets; Had Radiation yesterday  -Scheduled for Keytruda today we will cc Dr. Kaleen [name] input but Dr. Earlie [name] yet to see the patient   -Rad Onc Following; Per Jill [name] Note: "We will likely need to treat her left hip as well and will order plain films to this site. We will have to re-assess her status overall as well with her spine, and will likely need to reimage her soon. Fortunately SRS can occur in the setting of ongoing [place], so we will likely be in the midst of her 1st or 2nd cycle when we can proceed." -Change breathing treatments to Ipratropium/Xopenex due to Sinus Tachycardia  -Started Budesonide BID as well  -Bowel regimen with senna docusate 1 tab p.o. twice daily, sorbitol 30 mils p.o. daily PRN moderate constipation and MiraLAX 17 g p.o. daily for mild constipation -Pain control as Above   Diffuse interstitial opacities suspicious for edema -Seen on CXR on 03/15/18 -Would not treat at this time and will reevaluate with a chest CT today  Metabolic Acidosis -Unclear cause but improved -BMET this AM showed a CO2 of 28  Sinus Tachycardia -Patient's heart rate remains elevated despite current pain control regimen -Given 1 L of normal saline bolus again today and 1 was given yesterday  -Check EKG and showed Sinus Tachycardia at a rate of 121 with no evidence of ST Elevation on my interpretation  -Change duo nebs to Xopenex/Atrovent -Continue pain control as above -Checked TSH and was 1.641 and Free T4 was 1.09 and normal -Moved to Telemetry yesterday and started IV Lopressor 5 mg q6hprn for HR>130; Continue to Monitor closely  -Checking CTA of the chest to rule out PE as patient is also dyspneic  Hypokalemia -Patient's potassium was 3.6 Replete with 40 mEq of potassium chloride today -Magnesium level was 1.9 -Continue monitor and replete as necessary -Repeat CMP in a.m.  Tobacco Abuse -Patient states she quit -Continue with Nicotine Patch  History of Schizophrenia -Continue with Haldol 5 mg p.o. twice daily  DVT prophylaxis: Enoxaparin 40 mg sq q24h Code Status: FULL CODE Family  Communication: No family present at bedside Disposition Plan: SNF when medically stable and improvement in respiratory status  Consultants:   Palliative Care Medicine  Radiation Oncology  Medical Oncology    Procedures: None  Antimicrobials:  Anti-infectives (From admission, onward)   None     Subjective: Seen and examined at bedside and states her pain was a 7 out of 10 but her main complaint was her dyspnea.  She states that she feels short of breath but the flutter valve helps.  Is able to cough up some sputum. No CP or lightheadedness or dizziness.  Objective: Vitals:   03/18/18 0515 03/18/18 0828 03/18/18 1155 03/18/18 1218  BP: 105/76   120/79  Pulse: (!) 115   (!) 115  Resp: 17   (!) 25  Temp: 98.2 F (36.8 C)   98.9 F (37.2 C)  TempSrc: Oral   Oral  SpO2: 95% (!) 88% 93% 94%  Weight:      Height:        Intake/Output Summary (Last 24 hours) at 03/18/2018 1320 Last data filed at 03/18/2018 1139 Gross per 24 hour  Intake 1880 ml  Output 1525 ml  Net 355 ml   Filed Weights   03/17/18 1814  Weight: 67.5 kg   Examination: Physical Exam:  Constitutional: Well-nourished, well-developed overweight Caucasian female is currently in no acute distress Eyes: Lids and conjunctive are normal.  Sclera are anicteric ENMT: External ears and nose appear normal.  Mucous members are moist Neck: Neck appears supple with no JVD Respiratory: Diminished to auscultation bilaterally with mild wheezing and coarse breath sounds worse on the right compared to left.  Normal respiratory effort is not tachypneic but is using supplemental oxygen via nasal cannula Cardiovascular: Tachycardic rate but regular rhythm.  Appreciable murmurs, rubs, gallops.  No lower extremity edema  Abdomen: Soft, nontender, slightly distended.  Bowel sounds present all 4 quadrants GU: Deferred Musculoskeletal: No contractures cyanosis.  No joint deformities noted Skin: Skin is warm and dry no appreciable  rashes lesions limited skin evaluation Neurologic: Cranial nerves II through XII grossly intact no appreciable focal deficits.  Romberg sign and cerebellar reflexes are not assessed Psychiatric: Normal mood and affect.  Intact judgment and insight.  Patient is awake and alert and oriented x3.  Slightly anxious but has appropriate affect  Data Reviewed: I have personally reviewed following labs and imaging studies  CBC: Recent Labs  Lab 03/15/18 0327 03/15/18 1111 03/16/18 0900 03/17/18 0500 03/18/18 0506  WBC 6.8 6.8 6.3 6.0 5.3  NEUTROABS 5.7  --  5.0 4.8 4.1  HGB 13.7 13.4 12.6 11.9* 11.2*  HCT 41.3 41.0 39.0 37.5 35.6*  MCV 87.5 88.4 89.0 90.4 90.8  PLT 278 346 327 307 409   Basic Metabolic Panel: Recent Labs  Lab 03/15/18 0327 03/15/18 1111 03/16/18 0900 03/17/18 0500 03/18/18 0506  NA 138  --  139 140 141  K 4.5  --  3.4* 4.1 3.6  CL 107  --  105 105 106  CO2 18*  --  25 26 28   GLUCOSE 102*  --  104* 101* 104*  BUN 11  --  8 6 5*  CREATININE 0.40* 0.39* 0.30* 0.31* 0.31*  CALCIUM 9.4  --  9.5 9.6 9.4  MG  --   --  1.7 1.7 1.9  PHOS  --   --  2.9 3.2 3.3   GFR: Estimated Creatinine Clearance: 69 mL/min (A) (by C-G formula based on SCr of 0.31 mg/dL (L)). Liver Function Tests: Recent Labs  Lab 03/16/18 0900 03/17/18 0500 03/18/18 0506  AST 21 20 20   ALT 20 20 19   ALKPHOS 178* 155* 140*  BILITOT 0.7 0.4 0.6  PROT 5.8* 5.8* 5.6*  ALBUMIN 2.7* 2.6* 2.6*   No results for input(s): LIPASE, AMYLASE in the last 168 hours. No results for input(s): AMMONIA in the last 168 hours. Coagulation Profile: No results for input(s): INR, PROTIME in the last 168 hours. Cardiac Enzymes: No results for input(s): CKTOTAL, CKMB, CKMBINDEX, TROPONINI in the last 168 hours. BNP (last 3 results) No results for input(s): PROBNP in the last 8760 hours. HbA1C: No results for input(s): HGBA1C in the last 72 hours. CBG: Recent Labs  Lab 03/17/18 1643  GLUCAP 92   Lipid  Profile: No results for input(s): CHOL, HDL, LDLCALC, TRIG, CHOLHDL, LDLDIRECT in the last 72 hours. Thyroid Function Tests: Recent Labs    03/18/18 0506  TSH 1.641  FREET4 1.09   Anemia Panel: No results for input(s): VITAMINB12, FOLATE, FERRITIN, TIBC, IRON, RETICCTPCT in the last 72 hours. Sepsis Labs: No results for input(s): PROCALCITON, LATICACIDVEN in the last 168 hours.  No results found for this or any previous visit (from the past 240 hour(s)).   Radiology Studies: No results found. Scheduled Meds: .  budesonide (PULMICORT) nebulizer solution  0.25 mg Nebulization BID  . enoxaparin (LOVENOX) injection  40 mg Subcutaneous Q24H  . fentaNYL  50 mcg Transdermal Q72H  . guaiFENesin  1,200 mg Oral BID  . haloperidol  5 mg Oral BID  . iopamidol      . ipratropium  0.5 mg Nebulization TID  . levalbuterol  0.63 mg Nebulization TID  . nicotine  21 mg Transdermal Daily  . rosuvastatin  20 mg Oral QPM  . senna-docusate  1 tablet Oral BID   Continuous Infusions:   LOS: 3 days   Kerney Elbe, DO [facility] PAGER is on [place]  If 7PM-7AM, please contact night-coverage [email or web address] Password [email or web address] 03/18/2018, 1:20 PM

## 2018-03-18 NOTE — Progress Notes (Signed)
                                                                                                                                                                                                         Daily Progress Note   Patient Name: Jill [name]       Date: 03/18/2018 DOB: 05/24/1961  Age: 57 y.o. MRN#: [id number] Attending Physician: Sheikh, Omair Latif, DO Primary Care Physician: Kaplan, Kristen W., PA-C Admit Date: 03/15/2018  Reason for Consultation/Follow-up: Establishing goals of care and Pain control  Subjective: I met today with Jill [name].  She continues to endorse that her pain has been better controlled since changing to dilaudid from oxycodone but still not at her goal for pain.   Chart reviewed and she has had 26 mg of PO dilaudid and 4mg of IV dilaudid in the last 24 hours.  Reports having BM yesterday but feels she is getting constipated.  Length of Stay: 3  Current Medications: Scheduled Meds:  . budesonide (PULMICORT) nebulizer solution  0.25 mg Nebulization BID  . enoxaparin (LOVENOX) injection  40 mg Subcutaneous Q24H  . fentaNYL  100 mcg Transdermal Q72H  . guaiFENesin  1,200 mg Oral BID  . haloperidol  5 mg Oral BID  . iopamidol      . ipratropium  0.5 mg Nebulization TID  . levalbuterol  0.63 mg Nebulization TID  . nicotine  21 mg Transdermal Daily  . rosuvastatin  20 mg Oral QPM  . senna-docusate  2 tablet Oral BID    Continuous Infusions:   PRN Meds: acetaminophen, HYDROmorphone (DILAUDID) injection, HYDROmorphone, metoprolol tartrate, polyethylene glycol, sorbitol  Physical Exam      General: Alert, awake, in no acute distress. Appears older that stated age. HEENT: No bruits, no goiter, no JVD Heart: Regular rate and rhythm. No murmur appreciated. Lungs: Good air movement, clear Abdomen: Soft, nontender, nondistended, positive bowel sounds.  Ext: No significant edema Skin: Warm and dry Neuro: Grossly intact, nonfocal.     Vital  Signs: BP 120/79 (BP Location: Left Arm)   Pulse (!) 115   Temp 98.9 F (37.2 C) (Oral)   Resp (!) 25   Ht 5' 1" (1.549 m)   Wt 67.5 kg   SpO2 90%   BMI 28.12 kg/m  SpO2: SpO2: 90 % O2 Device: O2 Device: Nasal Cannula O2 Flow Rate: O2 Flow Rate (L/min): 3 L/min  Intake/output summary:   Intake/Output Summary (Last 24 hours) at 03/18/2018 1711 Last data filed at 03/18/2018 1444 Gross per 24 hour  Intake   2120 ml  Output 1925 ml  Net 195 ml   LBM: Last BM Date: 03/16/18 Baseline Weight: Weight: 67.5 kg Most recent weight: Weight: 67.5 kg       Palliative Assessment/Data:    Flowsheet Rows     Most Recent Value  Intake Tab  Referral Department  Hospitalist  Unit at Time of Referral  Oncology Unit  Palliative Care Primary Diagnosis  Cancer  Date Notified  03/15/18  Palliative Care Type  Return patient Palliative Care  Reason for referral  Pain  Date of Admission  03/15/18  Date first seen by Palliative Care  03/15/18  # of days Palliative referral response time  0 Day(s)  # of days IP prior to Palliative referral  0  Clinical Assessment  Palliative Performance Scale Score  40%  Pain Max last 24 hours  10  Pain Min Last 24 hours  7  Psychosocial & Spiritual Assessment  Palliative Care Outcomes  Patient/Family meeting held?  Yes  Palliative Care Outcomes  Improved pain interventions, Clarified goals of care      Patient Active Problem List   Diagnosis Date Noted  . Weakness 03/15/2018  . Cancer related pain   . Palliative care by specialist   . Metastatic lung carcinoma, unspecified laterality ([place])   . Bone metastasis ([place]) 01/13/2018  . HCAP (healthcare-associated pneumonia) 01/07/2018  . Tachycardia 01/07/2018  . Encounter for antineoplastic immunotherapy 12/12/2017  . DNR (do not resuscitate) discussion 12/12/2017  . Primary malignant neoplasm of bronchus of right lower lobe ([place]) 11/17/2017  . Brain metastases ([place]) 11/14/2017  . Sepsis due to  pneumonia ([place]) 11/13/2017  . Hemoptysis 11/13/2017  . Hypoxia 11/13/2017  . Metastatic lung cancer (metastasis from lung to other site), right ([place]) 11/13/2017  . Chronic migraine without aura 11/13/2017  . OBESITY, NOS 08/31/2006  . Schizophrenia ([place]) 08/31/2006  . TOBACCO DEPENDENCE 08/31/2006  . HYPERTENSION, BENIGN SYSTEMIC 08/31/2006  . MENOPAUSAL SYNDROME 08/31/2006  . HYDRADENITIS 08/31/2006    Palliative Care Assessment & Plan   Patient Profile: 57 y.o. female  with past medical history of stage IV cancer on Keytruda and limited radiation, HTN, schizophrenia, postobstructive PNA, compression fracture admitted on 03/15/2018 with cancer related pain who had prior stated she would like hospice support but is now stating that she is interested in further immunotherapy.   Assessment: Patient Active Problem List   Diagnosis Date Noted  . Weakness 03/15/2018  . Cancer related pain   . Palliative care by specialist   . Metastatic lung carcinoma, unspecified laterality ([place])   . Bone metastasis ([place]) 01/13/2018  . HCAP (healthcare-associated pneumonia) 01/07/2018  . Tachycardia 01/07/2018  . Encounter for antineoplastic immunotherapy 12/12/2017  . DNR (do not resuscitate) discussion 12/12/2017  . Primary malignant neoplasm of bronchus of right lower lobe ([place]) 11/17/2017  . Brain metastases ([place]) 11/14/2017  . Sepsis due to pneumonia ([place]) 11/13/2017  . Hemoptysis 11/13/2017  . Hypoxia 11/13/2017  . Metastatic lung cancer (metastasis from lung to other site), right ([place]) 11/13/2017  . Chronic migraine without aura 11/13/2017  . OBESITY, NOS 08/31/2006  . Schizophrenia ([place]) 08/31/2006  . TOBACCO DEPENDENCE 08/31/2006  . HYPERTENSION, BENIGN SYSTEMIC 08/31/2006  . MENOPAUSAL SYNDROME 08/31/2006  . HYDRADENITIS 08/31/2006    Recommendations/Plan:  Pain: She continues to have significant pain medication needs after increase to 45mg patch and her pain rating is  consistently 7/10 at best per her report.  In the last 24 hours, she has had oral morphine  equivalent of 184mg of oral morphine.  Will therefore plan to increase fentanyl to 100mcg/hr.  Continue dilaudid 4mg PO every 3 hours as needed.  Also has backup dilaudid 1mg IV to be given 40 minutes after oral medication if oral route is ineffective.    She is also pursuing radiation which is better long term treatment than increasing opioids.  Consider steroids, but would first see oncology opinion of this as she is getting immunotherapy and sometimes preference to avoid steroids.  Constipation: Increase to senna 2 tabs twice daily  Goals of Care and Additional Recommendations:  Limitations on Scope of Treatment: Full Scope Treatment  Code Status:    Code Status Orders  (From admission, onward)         Start     Ordered   03/15/18 0808  Full code  Continuous     03/15/18 0808        Code Status History    Date Active Date Inactive Code Status Order ID Comments User Context   01/07/2018 0201 01/13/2018 2127 Full Code [id number]  Kim, James, MD ED   11/13/2017 0112 11/18/2017 1705 Full Code [id number]  Smith, Rondell A, MD ED       Prognosis:   Unable to determine  Discharge Planning:  Skilled Nursing Facility for rehab with Palliative care service follow-up  Care plan was discussed with patient.  Bedside RN  Thank you for allowing the Palliative Medicine Team to assist in the care of this patient.   Total Time 40 Prolonged Time Billed No      Greater than 50%  of this time was spent counseling and coordinating care related to the above assessment and plan.   , MD  Please contact Palliative Medicine Team phone at [phone] for questions and concerns.      

## 2018-03-18 NOTE — Progress Notes (Signed)
Assumed care of patient. Agree with previous RNs assessment. Will continue to monitor patient.

## 2018-03-19 ENCOUNTER — Ambulatory Visit: Payer: Medicare Other

## 2018-03-19 ENCOUNTER — Ambulatory Visit: Payer: Medicare Other | Admitting: Radiation Oncology

## 2018-03-19 ENCOUNTER — Inpatient Hospital Stay (HOSPITAL_COMMUNITY): Payer: Medicare Other

## 2018-03-19 DIAGNOSIS — F17200 Nicotine dependence, unspecified, uncomplicated: Secondary | ICD-10-CM

## 2018-03-19 DIAGNOSIS — C3491 Malignant neoplasm of unspecified part of right bronchus or lung: Secondary | ICD-10-CM

## 2018-03-19 DIAGNOSIS — C3431 Malignant neoplasm of lower lobe, right bronchus or lung: Secondary | ICD-10-CM

## 2018-03-19 LAB — CBC WITH DIFFERENTIAL/PLATELET
BASOS ABS: 0 10*3/uL (ref 0.0–0.1)
Basophils Relative: 0 %
EOS ABS: 0.2 10*3/uL (ref 0.0–0.7)
EOS PCT: 2 %
HCT: 33.7 % — ABNORMAL LOW (ref 36.0–46.0)
HEMOGLOBIN: 10.8 g/dL — AB (ref 12.0–15.0)
Lymphocytes Relative: 5 %
Lymphs Abs: 0.3 10*3/uL — ABNORMAL LOW (ref 0.7–4.0)
MCH: 29.3 pg (ref 26.0–34.0)
MCHC: 32 g/dL (ref 30.0–36.0)
MCV: 91.6 fL (ref 78.0–100.0)
Monocytes Absolute: 0.5 10*3/uL (ref 0.1–1.0)
Monocytes Relative: 7 %
NEUTROS PCT: 86 %
Neutro Abs: 6.3 10*3/uL (ref 1.7–7.7)
PLATELETS: 247 10*3/uL (ref 150–400)
RBC: 3.68 MIL/uL — AB (ref 3.87–5.11)
RDW: 17.3 % — ABNORMAL HIGH (ref 11.5–15.5)
WBC: 7.3 10*3/uL (ref 4.0–10.5)

## 2018-03-19 LAB — COMPREHENSIVE METABOLIC PANEL
ALBUMIN: 2.3 g/dL — AB (ref 3.5–5.0)
ALT: 20 U/L (ref 0–44)
AST: 21 U/L (ref 15–41)
Alkaline Phosphatase: 120 U/L (ref 38–126)
Anion gap: 9 (ref 5–15)
BUN: 5 mg/dL — ABNORMAL LOW (ref 6–20)
CHLORIDE: 99 mmol/L (ref 98–111)
CO2: 26 mmol/L (ref 22–32)
Calcium: 9.1 mg/dL (ref 8.9–10.3)
Glucose, Bld: 103 mg/dL — ABNORMAL HIGH (ref 70–99)
Potassium: 4 mmol/L (ref 3.5–5.1)
SODIUM: 134 mmol/L — AB (ref 135–145)
Total Bilirubin: 0.7 mg/dL (ref 0.3–1.2)
Total Protein: 5.6 g/dL — ABNORMAL LOW (ref 6.5–8.1)

## 2018-03-19 LAB — MAGNESIUM: MAGNESIUM: 1.7 mg/dL (ref 1.7–2.4)

## 2018-03-19 LAB — PHOSPHORUS: PHOSPHORUS: 3.3 mg/dL (ref 2.5–4.6)

## 2018-03-19 MED ORDER — SODIUM CHLORIDE 0.9% FLUSH
10.0000 mL | INTRAVENOUS | Status: DC | PRN
  Administered 2018-03-19: 10 mL
  Filled 2018-03-19: qty 40

## 2018-03-19 MED ORDER — VANCOMYCIN HCL 10 G IV SOLR
1500.0000 mg | Freq: Once | INTRAVENOUS | Status: AC
  Administered 2018-03-19: 1500 mg via INTRAVENOUS
  Filled 2018-03-19: qty 1500

## 2018-03-19 MED ORDER — SODIUM CHLORIDE 0.9 % IV SOLN
1.0000 g | Freq: Three times a day (TID) | INTRAVENOUS | Status: DC
  Administered 2018-03-19 – 2018-03-21 (×7): 1 g via INTRAVENOUS
  Filled 2018-03-19 (×7): qty 1

## 2018-03-19 MED ORDER — VANCOMYCIN HCL IN DEXTROSE 1-5 GM/200ML-% IV SOLN
1000.0000 mg | INTRAVENOUS | Status: DC
  Administered 2018-03-20 – 2018-03-21 (×2): 1000 mg via INTRAVENOUS
  Filled 2018-03-19 (×2): qty 200

## 2018-03-19 MED ORDER — HYDROMORPHONE HCL 2 MG PO TABS
4.0000 mg | ORAL_TABLET | ORAL | Status: DC | PRN
  Administered 2018-03-19: 2 mg via ORAL
  Administered 2018-03-19 – 2018-03-20 (×6): 4 mg via ORAL
  Filled 2018-03-19 (×7): qty 2

## 2018-03-19 MED ORDER — ALTEPLASE 2 MG IJ SOLR
2.0000 mg | Freq: Once | INTRAMUSCULAR | Status: AC
  Administered 2018-03-19: 2 mg
  Filled 2018-03-19: qty 2

## 2018-03-19 MED ORDER — METHYLPREDNISOLONE SODIUM SUCC 125 MG IJ SOLR
60.0000 mg | Freq: Two times a day (BID) | INTRAMUSCULAR | Status: DC
  Administered 2018-03-19 (×2): 60 mg via INTRAVENOUS
  Filled 2018-03-19 (×3): qty 2

## 2018-03-19 MED ORDER — HYDROMORPHONE HCL 2 MG PO TABS
2.0000 mg | ORAL_TABLET | ORAL | Status: DC | PRN
  Administered 2018-03-19 (×2): 2 mg via ORAL
  Filled 2018-03-19 (×2): qty 1

## 2018-03-19 NOTE — Progress Notes (Signed)
Patient ID: Jill [name], female   DOB: April 16, 1961, 57 y.o.   MRN: [id number]  This NP visited patient at the bedside for pain medication adjustments, palliative medicine needs and emotional support.  Patient is awake and oriented, always pleasant.  She was admitted on 03/15/2018 with cancer related pain who had prior stated she would like hospice support but is now stating that she is interested in further immunotherapy and to continue with her radiation.   Pain management has been an ongoing struggle for [name].  Yesterday her Fentantly patch ws increased to 100 mcg and she is also utilizing  both IV and PO prn Dilaudid.  Educated patient on the importance of  pain control goal being achievable in OP setting.  We discussed pain goals as it relates to function.      -dc IV pain medications    -decrease pr dosing of Dilaudid to 2 mg po every 3           hrs as needed for breakthrough pain       - f/u in the morning for efficacy           We discussed the role of palliative radiation for pain control.  She tells me she declined intervention today.  I will f/u with rad-onc to verify recommendations.  Stressed that the  Radiation is meant to reduce pain for this patient at this time in her disease process.   Patient is hopeful for transition to SNF for rehab once stable.   Discussed with patient the importance of continued conversation with her  medical providers regarding overall plan of care and treatment options,  ensuring decisions are within the context of the patients values and GOCs.  Only a few weeks ago she had accepted hospice services. Patient options are limited by her complex psychosocial situation.  Questions and concerns addressed     Total time spent on the unit was 35 minutes  Greater than 50% of the time was spent in counseling and coordination of care  Wadie Lessen NP  Palliative Medicine Team Team Phone # [phone] Pager [phone]

## 2018-03-19 NOTE — Progress Notes (Signed)
PROGRESS NOTE    Jill [name]  IRC:[id number] DOB: 1960/11/11 DOA: 03/15/2018 PCP: Aletha Halim., PA-C   Brief Narrative:  HPI per Dr. Nita Sells 57 year old female with stage IV cancer on Keytruda followed by Dr. Earlie Server though smoker, HTN, schizophrenia Last admission to the hospital 01/13/2018 with acute respiratory failure hypoxia secondary to postobstructive pneumonia complicated by compression fracture T7 vertebra Can do that underwent stereotactic radiation therapy under the care of Dr. Sherwood Gambler and radiation oncologist Dr. Lisbeth Renshaw--- has also been seen by palliative care for uncontrolled pain --on a fentanyl patch as well as oxycodone  CXR not done, UA not done, feeling weak and came back to emergency room Appears that the patient was seen by palliative care and has decided to forego [facility]--- is unclear at this time whether Dr. Julien Nordmann is aware or not of this discussion--- patient is scheduled for his second cycle of Keytruda today  Patient states she came to the emergency room because of left leg pain-she states that she has not really been able to ambulate with her cane that she uses at home and has had further debility because of uncontrolled left shoulder pain from her metastases--it appears that she is getting XRT to the left shoulder She has not had any falls or any other issues  She states that the oxycodone is not working and is requesting by name Dilaudid-she is on fentanyl patch which she is using currently  She states that she has had maybe a little bit of burning in her urine but no hematuria  **Still complaining of Pain in Legs and shoulder. Underwent Radiation Therapy on Friday to the Left Shoulder and Dr. Garnet Koyanagi would like to simulate today and give 8 Gy to the Left Femur in 1 fraction but patient did not feel well and asked if it could be done tomorrow . Pain regimen continually being adjusted by Palliative Care. Hospitalization complicated by Sinus  Tachycardia and Dyspnea so CTA of the Chest was ordered to r/o PE as D-Dimer would likely be elevated in the setting of Malignancy and that was Negative but did reveal a Post-Obstructive PNA so she was started on IV Abx and IV steroids today.   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 Domingo Cocking started the patient on Fentanyl 25 mcg patch and increased to 50 mcg every 72 hours and has titrated even more to 100 mcg q72 -Dr. Domingo Cocking also started patient on hydromorphone 2 to 4 mg p.o. q. 3 PRN for moderate pain and severe pain and then 1 mg IV daily PRN cycle and pain medication after 40 minutes of oral Dilaudid if oral medications and effective in relieving pain -She had 26 mg po Dilaudid in the last 24 hours but Dr. Domingo Cocking to continue current regimen today  -Continue to adjust pain medication as necessary appreciate palliative input  Acute Respiratory Failure with Hypoxia in the setting of Persistent Acute Pneumonica Infilatrate/PNA, poA -Was hospitalized last time 2/2 to Postobstructive PNA complicated by Compression Fx -States she does not wear home oxygen all the time  and only as needed -Continue Supplemental O2 via [place] and wean as tolerated -Continuous Pulse Oximetry and Maintain O2 Sats >92% -CXR on 03/15/18: Question pneumonia in the right base with fibrosis, possibly due to a degree of previous radiation therapy. Appearance stable compared to earlier in the day. Pulmonary vascular congestion with mild interstitial edema, stable. Central catheter tip in superior vena cava.  No  pneumothorax. Expansile lytic lesion involving the left posterior second rib. A lytic lesion is also noted in the lateral left clavicle. These are lesions felt to be consistent with metastatic foci. -Check CTA Chest to r/o PE and evaluate if patient has a PNA; CTA showed: "No evidence of acute pulmonary emboli.  Significant reduction in size of the right infrahilar mass lesion with improvement of previously seen postobstructive consolidation in the right lower lobe. Persistent acute pneumonic infiltrate is noted within the right lower lobe and to a lesser degree in the right upper lobe." -Also noted to have new Compression deformities in T6,T10, T11 -C/w Guaifenesin, Flutter Valve, Incentive Spirometry -Breathing Treatments with Xopenex/Atrovent scheduled and added Budesonide today -Continue to monitor respiratory status and repeat chest x-ray in a.m.  RLL and RUL H CAP -As above -Started IV antibiotics with IV cefepime and IV vancomycin -Also started the patient on IV Solu-Medrol 60 mg every 12 -Continue with guaifenesin flutter valve and incentive spirometer -Also continue with breathing treatments with Xopenex and Atrovent scheduled and budesonide -Patient states that she feels as if her phlegm is loosening  Stage IV metastatic lung cancer diagnosed May 2019  -On XRT to left shoulder with distant mets; Had Radiation on Friday and was supposed to get simulation to day to the Left femur but patient did not feel well enough to go get Radiation. -Scheduled for Keytruda today we will cc Dr. Kaleen Mask input but Dr. Earlie Server yet to see the patient  -Rad Onc Following; Per W[facility] Flank Note: "We will likely need to treat her left hip as well and will order plain films to this site. We will have to re-assess her status overall as well with her spine, and will likely need to reimage her soon. Fortunately SRS can occur in the setting of ongoing [place], so we will likely be in the midst  of her 1st or 2nd cycle when we can proceed." -Change breathing treatments to Ipratropium/Xopenex due to Sinus Tachycardia  -Started Budesonide BID as well  -Bowel regimen with senna docusate 1 tab p.o. twice daily, sorbitol 30 mils p.o. daily PRN moderate constipation and MiraLAX 17 g p.o. daily for mild constipation -CT Scan showed bony metastatic disease has increased in interval from the prior exam -Pain control as Above   Diffuse interstitial opacities suspicious for edema -Seen on CXR on 03/15/18 -CTA shows improvement of the post-obstructive consolidation in RML but persistent acute Pneumonic Infiltrate withing the RLL and to a lesser degree RUL  Metabolic Acidosis -Unclear cause but improved -BMET this AM showed a CO2 of 26  Sinus Tachycardia in the setting of PNA -Patient's heart rate remains elevated despite current pain control regimen -Given 1 L of normal saline bolus again today and 1 was given yesterday  -Check EKG and showed Sinus Tachycardia at a rate of 121 with no evidence of ST Elevation on my interpretation  -Change duo nebs to Xopenex/Atrovent -Continue pain control as above -Checked TSH and was 1.641 and Free T4 was 1.09 and normal -Moved to Telemetry and started IV Lopressor 5 mg q6hprn for HR>130; Continue to  Monitor closely  -Started Metoprolol 25 mg po BID  -Checking CTA of the chest to rule out PE as patient is also dyspneic  Hypokalemia -Patient's potassium was improved now to 4.0 -Magnesium level was 1.7 -Continue monitor and replete as necessary -Repeat CMP in a.m.  Tobacco Abuse -Patient states she quit -Continue with Nicotine Patch  History of Schizophrenia -Continue with Haldol 5 mg p.o. twice daily  Hyponatremia -Mild at 134 -Continue monitor and trend and repeat CMP in a.m.  Normocytic Anemia -Patient's hemoglobin/hematocrit has been steadily dropping and is now 10.8/33.7 -? Dilutional drop as patient has received multiple  boluses -Continue monitor for signs and symptoms of bleeding -If continues to drop further we will consider holding enoxaparin 4 mg subcu every 24h -Repeat CMP in a.m.  DVT prophylaxis: Enoxaparin 40 mg sq q24h Code Status: FULL CODE Family Communication: No family present at bedside Disposition Plan: SNF when medically stable and improvement in respiratory status  Consultants:   Palliative Care Medicine  Radiation Oncology  Medical Oncology    Procedures: None  Antimicrobials:  Anti-infectives (From admission, onward)   Start     Dose/Rate Route Frequency Ordered Stop   03/20/18 0900  vancomycin (VANCOCIN) IVPB 1000 mg/200 mL premix     1,000 mg 200 mL/hr over 60 Minutes Intravenous Every 24 hours 03/19/18 0823     03/19/18 0900  ceFEPIme (MAXIPIME) 1 g in sodium chloride 0.9 % 100 mL IVPB     1 g 200 mL/hr over 30 Minutes Intravenous Every 8 hours 03/19/18 0823     03/19/18 0830  vancomycin (VANCOCIN) 1,500 mg in sodium chloride 0.9 % 500 mL IVPB     1,500 mg 250 mL/hr over 120 Minutes Intravenous  Once 03/19/18 0823 03/19/18 1343     Subjective: Seen and examined at bedside and states that her shortness of breath is still there and that she is coughing up some sputum.  Denies any chest pain, does not feel her heart racing however still feels short of breath.  Also stated that she did not feel well and did not want to go to radiation today.  No other concerns or complaints at this time and was surprised that she had another pneumonia  Objective: Vitals:   03/19/18 0407 03/19/18 0838 03/19/18 1245 03/19/18 1352  BP: 100/73  113/78   Pulse: (!) 110  98   Resp: 20     Temp: 98.2 F (36.8 C)  98.5 F (36.9 C)   TempSrc: Oral     SpO2: (!) 89% 90% 94% 90%  Weight:      Height:        Intake/Output Summary (Last 24 hours) at 03/19/2018 1713 Last data filed at 03/19/2018 9937 Gross per 24 hour  Intake 930 ml  Output 2025 ml  Net -1095 ml   Filed Weights    03/17/18 1814  Weight: 67.5 kg   Examination: Physical Exam:  Constitutional: Well-nourished, well-developed overweight Caucasian female currently no acute distress but does not feel well and appears more fatigued Eyes: Lids and conjunctive are normal.  Sclera are anicteric ENMT: External ears and nose appear normal.  Mucous members moist Neck: Appears supple no JVD Respiratory: Diminished to auscultation bilaterally with some wheezing and coarse breath sounds especially worse on the right compared to left she had a normal respiratory effort and however is wearing supplemental oxygen via nasal cannula Cardiovascular: Tachycardic rate but regular rhythm.  No appreciable murmurs, rubs, gallops.  No lower extremity  edema noted Abdomen: Soft, nontender, slightly distended.  Bowel sounds present in 4 quadrants GU: Deferred Musculoskeletal: Contractures cyanosis.  No joint deformity noted Skin: Skin is warm and dry no appreciable rashes or lesions limited skin evaluation Neurologic: Cranial nerves II through XII grossly intact with no appreciable focal deficits Psychiatric: Normal mood and affect.  Intact judgment insight.  Patient is awake, alert, oriented x3.  Slightly anxious and mildly depressed appearing  Data Reviewed: I have personally reviewed following labs and imaging studies  CBC: Recent Labs  Lab 03/15/18 0327 03/15/18 1111 03/16/18 0900 03/17/18 0500 03/18/18 0506 03/19/18 0557  WBC 6.8 6.8 6.3 6.0 5.3 7.3  NEUTROABS 5.7  --  5.0 4.8 4.1 6.3  HGB 13.7 13.4 12.6 11.9* 11.2* 10.8*  HCT 41.3 41.0 39.0 37.5 35.6* 33.7*  MCV 87.5 88.4 89.0 90.4 90.8 91.6  PLT 278 346 327 307 286 062   Basic Metabolic Panel: Recent Labs  Lab 03/15/18 0327 03/15/18 1111 03/16/18 0900 03/17/18 0500 03/18/18 0506 03/19/18 0557  NA 138  --  139 140 141 134*  K 4.5  --  3.4* 4.1 3.6 4.0  CL 107  --  105 105 106 99  CO2 18*  --  25 26 28 26   GLUCOSE 102*  --  104* 101* 104* 103*  BUN 11   --  8 6 5* 5*  CREATININE 0.40* 0.39* 0.30* 0.31* 0.31* <0.30*  CALCIUM 9.4  --  9.5 9.6 9.4 9.1  MG  --   --  1.7 1.7 1.9 1.7  PHOS  --   --  2.9 3.2 3.3 3.3   GFR: CrCl cannot be calculated (This lab value cannot be used to calculate CrCl because it is not a number: <0.30). Liver Function Tests: Recent Labs  Lab 03/16/18 0900 03/17/18 0500 03/18/18 0506 03/19/18 0557  AST 21 20 20 21   ALT 20 20 19 20   ALKPHOS 178* 155* 140* 120  BILITOT 0.7 0.4 0.6 0.7  PROT 5.8* 5.8* 5.6* 5.6*  ALBUMIN 2.7* 2.6* 2.6* 2.3*   No results for input(s): LIPASE, AMYLASE in the last 168 hours. No results for input(s): AMMONIA in the last 168 hours. Coagulation Profile: No results for input(s): INR, PROTIME in the last 168 hours. Cardiac Enzymes: No results for input(s): CKTOTAL, CKMB, CKMBINDEX, TROPONINI in the last 168 hours. BNP (last 3 results) No results for input(s): PROBNP in the last 8760 hours. HbA1C: No results for input(s): HGBA1C in the last 72 hours. CBG: Recent Labs  Lab 03/17/18 1643  GLUCAP 92   Lipid Profile: No results for input(s): CHOL, HDL, LDLCALC, TRIG, CHOLHDL, LDLDIRECT in the last 72 hours. Thyroid Function Tests: Recent Labs    03/18/18 0506  TSH 1.641  FREET4 1.09   Anemia Panel: No results for input(s): VITAMINB12, FOLATE, FERRITIN, TIBC, IRON, RETICCTPCT in the last 72 hours. Sepsis Labs: No results for input(s): PROCALCITON, LATICACIDVEN in the last 168 hours.  No results found for this or any previous visit (from the past 240 hour(s)).   Radiology Studies: Ct Angio Chest Pe W Or Wo Contrast  Result Date: 03/18/2018 CLINICAL DATA:  Shortness of breath and weakness for several months EXAM: CT ANGIOGRAPHY CHEST WITH CONTRAST TECHNIQUE: Multidetector CT imaging of the chest was performed using the standard protocol during bolus administration of intravenous contrast. Multiplanar CT image reconstructions and MIPs were obtained to evaluate the vascular  anatomy. CONTRAST:  173mL ISOVUE-370 IOPAMIDOL (ISOVUE-370) INJECTION 76% COMPARISON:  Chest x-ray from 03/15/2018,  CT from 01/07/2018 FINDINGS: Cardiovascular: Thoracic aorta demonstrates atherosclerotic calcifications. Mild aneurysmal dilatation of the ascending aorta 4 cm is noted. No dissection is seen. No cardiac enlargement is noted. No pericardial effusion is seen. Coronary calcifications are noted. The pulmonary artery demonstrates a normal branching pattern. No filling defects are identified to suggest pulmonary emboli. Right-sided PICC line is noted. Mediastinum/Nodes: Thoracic inlet is within normal limits. Scattered small mediastinal lymph nodes are noted relatively similar to that seen on the prior exam. None of these are significant by size criteria. Some right hilar adenopathy is again identified similar to that seen on recent CT examination. The right infrahilar mass lesion is not as well appreciated on today's exam. It demonstrates significant reduction in bulk when compare with the prior exam. Measurement is somewhat difficult due to adjacent lymph nodes. The esophagus is within normal limits. Lungs/Pleura: Emphysematous changes of lungs are noted. The left lung is clear with the exception of mild atelectatic changes. Considerable right lower lobe infiltrate is seen consistent with acute pneumonic infiltrate. The degree of consolidation has improved when compared with the prior CT examination. Right middle lobe nodule is again identified stable from the prior exams. It did not demonstrate hypermetabolic activity on prior PET-CT. Upper Abdomen: Visualized upper abdomen reveals no acute abnormality. Musculoskeletal: Degenerative changes of the thoracic spine are noted. There are changes consistent with prior kyphoplasty at T7. Adjacent compression deformity at T6 is noted which is new from the prior CT examination. Additionally compression deformities at T10 and T11 are noted new from the prior exam.  Lytic lesion is noted in the distal aspect of the left clavicle as well as a lytic lesion in the left second rib laterally. These have increased in size most marked on the left rib lesion with the soft tissue component now measuring approximately 3.3 cm. Expansile lesion is noted in the left seventh rib anteriorly with soft tissue component. Expansile lesion is also noted within the spinous process at T11. Review of the MIP images confirms the above findings. IMPRESSION: No evidence of acute pulmonary emboli. Significant reduction in size of the right infrahilar mass lesion with improvement of previously seen postobstructive consolidation in the right lower lobe. Persistent acute pneumonic infiltrate is noted within the right lower lobe and to a lesser degree in the right upper lobe. Previously treated T7 compression deformity. New compression deformities are noted at T6, T10 and T11. No definitive lytic lesion is identified although pathologic fracture cannot be totally excluded on this exam. Bony metastatic disease which has increased in the interval from the prior exam. Stable right middle lobe nodule which has been previously shown to be non hypermetabolic. Ascending thoracic aortic aneurysm. Recommend annual imaging followup by CTA or MRA. This recommendation follows 2010 ACCF/AHA/AATS/ACR/ASA/SCA/SCAI/SIR/STS/SVM Guidelines for the Diagnosis and Management of Patients with Thoracic Aortic Disease. Circulation. 2010; 121: Z563-O756 Aortic Atherosclerosis (ICD10-I70.0) and Emphysema (ICD10-J43.9). Electronically Signed   By: Inez Catalina M.D.   On: 03/18/2018 13:42   Dg Chest Port 1 View  Result Date: 03/19/2018 CLINICAL DATA:  Shortness of breath EXAM: PORTABLE CHEST 1 VIEW COMPARISON:  CT yesterday. FINDINGS: Right lower lobe pneumonia without visible cavitation or effusion. No pneumothorax. Atelectatic opacity at the left base. Right upper extremity PICC with tip at the SVC. Left second rib metastasis  with subpleural nodule. IMPRESSION: Right lower lobe pneumonia and left lower lobe atelectasis, likely stable from CT yesterday. Electronically Signed   By: Monte Fantasia M.D.   On: 03/19/2018 07:14  Scheduled Meds: . budesonide (PULMICORT) nebulizer solution  0.25 mg Nebulization BID  . enoxaparin (LOVENOX) injection  40 mg Subcutaneous Q24H  . fentaNYL  100 mcg Transdermal Q72H  . guaiFENesin  1,200 mg Oral BID  . haloperidol  5 mg Oral BID  . ipratropium  0.5 mg Nebulization TID  . levalbuterol  0.63 mg Nebulization TID  . methylPREDNISolone (SOLU-MEDROL) injection  60 mg Intravenous BID  . metoprolol tartrate  25 mg Oral BID  . nicotine  21 mg Transdermal Daily  . rosuvastatin  20 mg Oral QPM  . senna-docusate  2 tablet Oral BID   Continuous Infusions: . ceFEPime (MAXIPIME) IV 1 g (03/19/18 1647)  . [START ON 03/20/2018] vancomycin       LOS: 4 days   Kerney Elbe, DO [facility] PAGER is on [place]  If 7PM-7AM, please contact night-coverage [email or web address] Password [email or web address] 03/19/2018, 5:13 PM

## 2018-03-19 NOTE — Progress Notes (Signed)
Received call from RN that new dose of dilaudid 2mg  PO was ineffective.   I increased breakthrough PO dosing back to dilaudid 4mg  PO Q 3 hours PRN.  As she has had trial of lower 2mg  dose and it is ineffective, I would continue 4mg  for breakthrough dosing at least until she has had further radiation which I am hopeful will reduce her overall pain and allow further reduction in opioid dosing.  Micheline Rough, MD [place] Team [phone]

## 2018-03-19 NOTE — Progress Notes (Signed)
Pt stated that 2 mg  of DILAUDID is not controlling her pain I  have paged  Dr Lorin Picket Amado Coe

## 2018-03-19 NOTE — Plan of Care (Signed)

## 2018-03-19 NOTE — Progress Notes (Signed)
PT Cancellation Note  Patient Details Name: KHRYSTYNA SCHWALM MRN: [id number] DOB: [birth year]   Cancelled Treatment:     pt declined any OOB activity due to l hip pain.  Pt has been evaluated with rec for SNF.    Rica Koyanagi  PTA Acute  Rehabilitation Services Pager      [phone] Office      [phone]

## 2018-03-19 NOTE — Care Management Important Message (Signed)
Important Message  Patient Details  Name: ANAIAH MCMANNIS MRN: [id number] Date of Birth: 1961/06/05   Medicare Important Message Given:  Yes    [name] 03/19/2018, 12:18 PM

## 2018-03-19 NOTE — Care Management Note (Signed)
Case Management Note  Patient Details  Name: Jill [name] MRN: [id number] Date of Birth: December 08, 1960  Subjective/Objective: Admitted wweakness. Hx: Lung CA, compression fx, L femur pain. # adm/past 6 months. For xrt simulation today. Palliative-pain control, SNF w/PCS-CSW following.                   Action/Plan:dc SNF   Expected Discharge Date:  (unknown)               Expected Discharge Plan:  [place]  In-House Referral:  Clinical Social Work  Discharge planning Services  CM Consult  Post Acute Care Choice:    Choice offered to:     DME Arranged:    DME Agency:     HH Arranged:    [place] Agency:     Status of Service:  In process, will continue to follow  If discussed at Long Length of Stay Meetings, dates discussed:    Additional Comments:  Dessa Phi, RN 03/19/2018, 10:14 AM

## 2018-03-19 NOTE — Progress Notes (Signed)
I called the patient to let her know Dr. Lisbeth Renshaw would like her to simulate today and give 8 Gy to the left femur in 1 fraction tomorrow prior to discharging from the hospital to help with the pain in her left femur as a result of her cancer.     Carola Rhine, PAC

## 2018-03-19 NOTE — Progress Notes (Signed)
Pharmacy Antibiotic Note  Jill [name] is a 57 y.o. female with stage IV lung cancer presented to the ED on 03/15/2018 with c/o weakness. CXR on 9/16 showed "Right lower lobe pneumonia and left lower lobe atelectasis."  To start vancomycin and cefepime for PNA.   Plan: - vancomycin 1500 mg IV x1, then 1000 mg IV q24h (est AUC 515, scr rounded to 0.8) - cefepime 1gm IV q8h  ___________________________________  Height: 5\' 1"  (154.9 cm) Weight: 148 lb 13 oz (67.5 kg) IBW/kg (Calculated) : 47.8  Temp (24hrs), Avg:98.1 F (36.7 C), Min:97.6 F (36.4 C), Max:98.5 F (36.9 C)  Recent Labs  Lab 03/15/18 1111 03/16/18 0900 03/17/18 0500 03/18/18 0506 03/19/18 0557  WBC 6.8 6.3 6.0 5.3 7.3  CREATININE 0.39* 0.30* 0.31* 0.31* <0.30*    CrCl cannot be calculated (This lab value cannot be used to calculate CrCl because it is not a number: <0.30).    Allergies  Allergen Reactions  . Ibuprofen Nausea And Vomiting  . Morphine And Related     Headaches, Vomiting  . Prednisone     Dizziness and pain     Thank you for allowing pharmacy to be a part of this patient's care.  Lynelle Doctor 03/19/2018 1:14 PM

## 2018-03-20 ENCOUNTER — Ambulatory Visit: Payer: Medicare Other

## 2018-03-20 ENCOUNTER — Ambulatory Visit
Admit: 2018-03-20 | Discharge: 2018-03-20 | Disposition: A | Discharge status: Home / Self Care | Payer: Medicare Other | Attending: Radiation Oncology | Admitting: Radiation Oncology

## 2018-03-20 ENCOUNTER — Inpatient Hospital Stay (HOSPITAL_COMMUNITY): Payer: Medicare Other

## 2018-03-20 DIAGNOSIS — C7951 Secondary malignant neoplasm of bone: Secondary | ICD-10-CM

## 2018-03-20 LAB — CBC WITH DIFFERENTIAL/PLATELET
Basophils Absolute: 0 10*3/uL (ref 0.0–0.1)
Basophils Relative: 0 %
EOS ABS: 0 10*3/uL (ref 0.0–0.7)
EOS PCT: 0 %
HCT: 32.3 % — ABNORMAL LOW (ref 36.0–46.0)
Hemoglobin: 10.4 g/dL — ABNORMAL LOW (ref 12.0–15.0)
LYMPHS ABS: 0.1 10*3/uL — AB (ref 0.7–4.0)
Lymphocytes Relative: 2 %
MCH: 29.1 pg (ref 26.0–34.0)
MCHC: 32.2 g/dL (ref 30.0–36.0)
MCV: 90.2 fL (ref 78.0–100.0)
MONOS PCT: 3 %
Monocytes Absolute: 0.1 10*3/uL (ref 0.1–1.0)
Neutro Abs: 4.5 10*3/uL (ref 1.7–7.7)
Neutrophils Relative %: 95 %
PLATELETS: 269 10*3/uL (ref 150–400)
RBC: 3.58 MIL/uL — ABNORMAL LOW (ref 3.87–5.11)
RDW: 16.7 % — ABNORMAL HIGH (ref 11.5–15.5)
WBC: 4.7 10*3/uL (ref 4.0–10.5)

## 2018-03-20 LAB — COMPREHENSIVE METABOLIC PANEL
ALBUMIN: 2.3 g/dL — AB (ref 3.5–5.0)
ALK PHOS: 132 U/L — AB (ref 38–126)
ALK PHOS: 123 U/L (ref 38–126)
ALT: 48 U/L — AB (ref 0–44)
ALT: 93 U/L — ABNORMAL HIGH (ref 0–44)
ANION GAP: 9 (ref 5–15)
AST: 59 U/L — ABNORMAL HIGH (ref 15–41)
AST: 99 U/L — ABNORMAL HIGH (ref 15–41)
Albumin: 2.4 g/dL — ABNORMAL LOW (ref 3.5–5.0)
Anion gap: 8 (ref 5–15)
BILIRUBIN TOTAL: 0.3 mg/dL (ref 0.3–1.2)
BUN: 6 mg/dL (ref 6–20)
BUN: 9 mg/dL (ref 6–20)
CALCIUM: 9.4 mg/dL (ref 8.9–10.3)
CO2: 29 mmol/L (ref 22–32)
CO2: 28 mmol/L (ref 22–32)
CREATININE: 0.3 mg/dL — AB (ref 0.44–1.00)
Calcium: 9.5 mg/dL (ref 8.9–10.3)
Chloride: 101 mmol/L (ref 98–111)
Chloride: 100 mmol/L (ref 98–111)
Creatinine, Ser: 0.4 mg/dL — ABNORMAL LOW (ref 0.44–1.00)
GFR calc Af Amer: >60 mL/min (ref >60)
GLUCOSE: 146 mg/dL — AB (ref 70–99)
GLUCOSE: 182 mg/dL — AB (ref 70–99)
POTASSIUM: 3.7 mmol/L (ref 3.5–5.1)
Potassium: 3.9 mmol/L (ref 3.5–5.1)
SODIUM: 138 mmol/L (ref 135–145)
Sodium: 137 mmol/L (ref 135–145)
TOTAL PROTEIN: 5.9 g/dL — AB (ref 6.5–8.1)
TOTAL PROTEIN: 5.6 g/dL — AB (ref 6.5–8.1)
Total Bilirubin: 0.6 mg/dL (ref 0.3–1.2)

## 2018-03-20 LAB — PHOSPHORUS: Phosphorus: 3.1 mg/dL (ref 2.5–4.6)

## 2018-03-20 LAB — MAGNESIUM: MAGNESIUM: 1.8 mg/dL (ref 1.7–2.4)

## 2018-03-20 MED ORDER — METHYLPREDNISOLONE SODIUM SUCC 40 MG IJ SOLR
40.0000 mg | Freq: Two times a day (BID) | INTRAMUSCULAR | Status: DC
  Administered 2018-03-20 – 2018-03-21 (×3): 40 mg via INTRAVENOUS
  Filled 2018-03-20 (×2): qty 1

## 2018-03-20 MED ORDER — HYDROMORPHONE HCL 2 MG PO TABS
2.0000 mg | ORAL_TABLET | ORAL | Status: DC | PRN
  Administered 2018-03-20 (×2): 2 mg via ORAL
  Administered 2018-03-20 – 2018-03-21 (×4): 4 mg via ORAL
  Administered 2018-03-21: 2 mg via ORAL
  Filled 2018-03-20 (×2): qty 2
  Filled 2018-03-20: qty 1
  Filled 2018-03-20 (×2): qty 2
  Filled 2018-03-20: qty 1
  Filled 2018-03-20: qty 2

## 2018-03-20 MED ORDER — LEVALBUTEROL HCL 0.63 MG/3ML IN NEBU: 0.6300 mg | INHALATION_SOLUTION | Freq: Four times a day (QID) | RESPIRATORY_TRACT | Status: DC | PRN

## 2018-03-20 NOTE — Progress Notes (Signed)
Patient ID: Jill [name], female   DOB: Mar 11, 1961, 57 y.o.   MRN: [id number]  This NP visited patient at the bedside for ongoing pain medication adjustments, palliative medicine needs and emotional support.  Patient is awake and oriented, and reports pain is "basically controlled", however she is still clock watching and taking medications every three hours.   I discussed and educated her on the importance of utilizing the prn medications only if needed and not by the clock.   The gaol is to reduce need for medciations.  Continued education on the importance of pain control with the goal being achievable in OP setting.    Continue:    - Fentanyl 100 mcg patch as directed    - Dilaudid to 2-4  mg po every 3 hrs as needed for breakthrough pain       - Recommend palliative services on discharge       We again discussed the role of palliative radiation for pain control.    Stressed that the  radiation is meant to reduce pain for this patient at this time in her disease process.    Patient is hopeful for transition to SNF for rehab once stable.   Discussed with patient the importance of continued conversation with her  medical providers regarding overall plan of care and treatment options,  ensuring decisions are within the context of the patients values and GOCs.  Only a few weeks ago she had accepted hospice services. Patient options are limited by her complex psychosocial situation.  Questions and concerns addressed     Total time spent on the unit was 35 minutes   Discussed with Dr Alfredia Ferguson  Greater than 50% of the time was spent in counseling and coordination of care  Jill Lessen NP  Palliative Medicine Team Team Phone # [phone] Pager [phone]

## 2018-03-20 NOTE — Progress Notes (Signed)
PROGRESS NOTE    Jill [name]  IZT:[id number] DOB: [birth year] DOA: 03/15/2018 PCP: Aletha Halim., PA-C   Brief Narrative:  HPI per Dr. Nita Sells 57 year old female with stage IV cancer on Keytruda followed by Dr. Earlie Server though smoker, HTN, schizophrenia Last admission to the hospital 01/13/2018 with acute respiratory failure hypoxia secondary to postobstructive pneumonia complicated by compression fracture T7 vertebra Can do that underwent stereotactic radiation therapy under the care of Dr. Sherwood Gambler and radiation oncologist Dr. Lisbeth Renshaw--- has also been seen by palliative care for uncontrolled pain --on a fentanyl patch as well as oxycodone  Patient states she came to the emergency room because of left leg pain-she states that she has not really been able to ambulate with her cane that she uses at home and has had further debility because of uncontrolled left shoulder pain from her metastases--it appears that she is getting XRT to the left shoulder.  **Still complaining of Pain in Legs and shoulder. Underwent Radiation Therapy on Friday to the Left Shoulder and Dr. Garnet Koyanagi would like to simulate today and give 8 Gy to the Left Femur in 1 fraction. Pain regimen continually being adjusted by Palliative Care. Hospitalization complicated by Sinus Tachycardia and Dyspnea so CTA of the Chest was ordered to r/o PE as D-Dimer would likely be elevated in the setting of Malignancy and that was Negative but did reveal a Post-Obstructive PNA so she was started on IV Abx and IV steroids and is improving .   Assessment & Plan:   Active Problems:   TOBACCO DEPENDENCE   Hypoxia   Metastatic lung cancer (metastasis from lung to other site), right [facility])   Primary malignant neoplasm of bronchus of right lower lobe (HCC)   HCAP (healthcare-associated pneumonia)   Tachycardia   Bone metastasis (HCC)   Cancer related pain   Metastatic lung carcinoma, right (HCC)   Generalized weakness  Chronic  cachexia and pain related to stage IV cancer in a setting of lung cancer -Palliative Consulted and adjusting Pain Regimen -Dr. Domingo Cocking started the patient on Fentanyl 25 mcg patch and increased to 50 mcg every 72 hours and has titrated even more to 100 mcg q72 -Dr. Domingo Cocking also started patient on hydromorphone 2 to 4 mg p.o. q. 3 PRN for moderate pain and severe pain and then 1 mg IV daily PRN cycle and pain medication after 40 minutes of oral Dilaudid if oral medications and effective in relieving pain but IV Dilaudid has been D/C'd and was started on 2 mg po q3hprn for Breakthrough  -Continue to adjust pain medication as necessary appreciate palliative input  Acute Respiratory Failure with Hypoxia in the setting of Persistent Acute Pneumonica Infilatrate/PNA, poA -Was hospitalized last time 2/2 to Postobstructive PNA complicated by Compression Fx -States she does not wear home oxygen all the time and only as needed -Continue Supplemental O2 via [place] and wean as tolerated -Continuous Pulse Oximetry and Maintain O2 Sats >92% -CXR today: "Lower inspiratory volumes with increasing bibasilar atelectasis superimposed on multifocal pneumonia in the right lung. Slightly increased pulmonary vascular congestion now bordering on mild interstitial edema. Stable position of right upper extremity PICC." -Check CTA Chest to r/o PE and evaluate if patient has a PNA; CTA showed: "No evidence of acute pulmonary emboli.  Significant reduction in size of the right infrahilar mass lesion with improvement of previously seen postobstructive consolidation in the right lower lobe. Persistent acute pneumonic infiltrate is noted within the right lower lobe and to a lesser  degree in the right upper lobe." -Also noted to have new Compression deformities in T6,T10, T11 -C/w Guaifenesin, Flutter Valve, Incentive Spirometry -Breathing Treatments with Xopenex/Atrovent scheduled and added Budesonide  -Continue to monitor  respiratory status and repeat chest x-ray in a.m. -Check ambulatory pulse oximetry to see if patient qualifies for oxygen prior to discharge  RLL and RUL H CAP -As above -Started IV antibiotics with IV cefepime and IV vancomycin and will continue and De-Escalate to Levofloxacin at D/C -Also started the patient on IV Solu-Medrol 60 mg every 12 but weaned to 40 mg IV q12h -Continue with Guaifenesin flutter valve and incentive spirometer -Also continue with breathing treatments with Xopenex and Atrovent scheduled and budesonide -Patient states that she feels as if her phlegm is loosening  Stage IV metastatic lung cancer diagnosed May 2019  -On XRT to left shoulder with distant mets; Had Radiation on Friday and was supposed to get simulation to day to the Left femur but patient did not feel well enough to go get Radiation. -Scheduled for Keytruda today we will cc Dr. Kaleen Mask input but Dr. Earlie Server yet to see the patient  -Rad Onc Following; Per Worthy Flank Note: "We will likely need to treat her left hip as well and will order plain films to this site. We will have to re-assess her status overall as well with her spine, and will likely need to reimage her soon. Fortunately SRS can occur in the setting of ongoing [place], so we will likely be in the midst of her 1st or 2nd cycle when we can proceed." -Change breathing treatments to Ipratropium/Xopenex due to Sinus Tachycardia  -Started Budesonide BID as well  -Bowel regimen with senna docusate 1 tab p.o. twice daily, sorbitol 30 mils p.o. daily PRN moderate constipation and MiraLAX 17 g p.o. daily for mild constipation -CT Scan showed bony metastatic disease has increased in interval from the prior exam -Pain control as Above  -Per conversation with Worthy Flank yesterday the patient is to undergo radiation simulation again status post be done yesterday and will be done today  Diffuse interstitial opacities suspicious for  edema -Seen on CXR on 03/15/18 -CTA shows improvement of the post-obstructive consolidation in RML but persistent acute Pneumonic Infiltrate withing the RLL and to a lesser degree RUL -Repeat CXR this AM showed Lower inspiratory volumes with increasing bibasilar atelectasis superimposed on multifocal pneumonia in the right lung. Slightly increased pulmonary vascular congestion now bordering on mild interstitial edema. Stable position of right upper extremity PICC  Metabolic Acidosis -Unclear cause but improved -BMET this AM showed a CO2 of 29  Sinus Tachycardia in the setting of PNA, much improved -Patient's heart rate remained elevated despite current pain control regimen -Given IVF Boluses  -Check EKG and showed Sinus Tachycardia at a rate of 121 with no evidence of ST Elevation on my interpretation  -Change duo nebs to Xopenex/Atrovent -Continue pain control as above -Checked TSH and was 1.641 and Free T4 was 1.09 and normal -Moved to Telemetry and started IV Lopressor 5 mg q6hprn for HR>130; Continue to Monitor closely  -Started Metoprolol 25 mg po BID  -Checking CTA of the chest to rule out PE as patient is also dyspneic and was Negative for PE but showed PNA -Treatment for PNA started and HR has improved  Hypokalemia -Patient's potassium was improved now to 3.9 -Magnesium level was 1.8 -Continue monitor and replete as necessary -Repeat CMP in a.m.  Tobacco Abuse -Patient states she quit -Continue with Nicotine  Patch  History of Schizophrenia -Continue with Haldol 5 mg p.o. twice daily  Hyponatremia -Mild at 134 and now improved 138 -Continue monitor and trend and repeat CMP in a.m.  Normocytic Anemia -Patient's hemoglobin/hematocrit has been steadily dropping and is now 10.4/32.3 -? Dilutional drop as patient has received multiple boluses -Continue monitor for signs and symptoms of bleeding -If continues to drop further we will consider holding enoxaparin 4 mg subcu  every 24h -Repeat CMP in a.m.  DVT prophylaxis: Enoxaparin 40 mg sq q24h Code Status: FULL CODE Family Communication: No family present at bedside Disposition Plan: Anticipate D/C to SNF in the next 24-48 hours  Consultants:   Palliative Care Medicine  Radiation Oncology  Medical Oncology    Procedures: None  Antimicrobials:  Anti-infectives (From admission, onward)   Start     Dose/Rate Route Frequency Ordered Stop   03/20/18 0900  vancomycin (VANCOCIN) IVPB 1000 mg/200 mL premix     1,000 mg 200 mL/hr over 60 Minutes Intravenous Every 24 hours 03/19/18 0823     03/19/18 0900  ceFEPIme (MAXIPIME) 1 g in sodium chloride 0.9 % 100 mL IVPB     1 g 200 mL/hr over 30 Minutes Intravenous Every 8 hours 03/19/18 0823     03/19/18 0830  vancomycin (VANCOCIN) 1,500 mg in sodium chloride 0.9 % 500 mL IVPB     1,500 mg 250 mL/hr over 120 Minutes Intravenous  Once 03/19/18 0823 03/19/18 1343     Subjective: Seen and examined at bedside and states that her shortness of breath is improving slightly and is able to cough up more sputum and breathe a little bit better.  States her leg pain was a 7 out of 10 and was hurting today.  No chest pain lightheadedness or dizziness.  No other concerns or complaints at this time   Objective: Vitals:   03/20/18 0758 03/20/18 1000 03/20/18 1215 03/20/18 1414  BP:  (!) 102/58 104/70   Pulse:  85 72   Resp:   (!) 26   Temp:  97.6 F (36.4 C) 97.8 F (36.6 C)   TempSrc:   Oral   SpO2: 90% (!) 88% 93% 90%  Weight:      Height:        Intake/Output Summary (Last 24 hours) at 03/20/2018 1616 Last data filed at 03/20/2018 1500 Gross per 24 hour  Intake 913.53 ml  Output -  Net 913.53 ml   Filed Weights   03/17/18 1814  Weight: 67.5 kg   Examination: Physical Exam:  Constitutional: Well-nourished, well-developed overweight Caucasian female is currently in no acute distress and appears calm and resting in bed Eyes: Lids and conjunctive are  normal.  Sclera are anicteric ENMT: External ears and nose appear normal.  Mucous members are moist Neck: Appears supple with no JVD Respiratory: Diminished to auscultation bilaterally with some wheezing and coarse breath sounds.  There is mild rhonchi also appreciated on the right compared to the left.  She had a normal respiratory effort however she still continue with her supplemental oxygen via nasal cannula and will try to wean this Cardiovascular: Rate and rhythm.  No appreciable murmurs, rubs, gallops.  No lower extremity edema noted Abdomen: Soft, nontender, slightly distended.  Bowel sounds present all 4 quadrants GU: Deferred Musculoskeletal: No contractures or cyanosis noted.  No joint deformities noted Skin: Skin is warm and dry no appreciable rashes or lesions on his skin evaluation Neurologic: Cranial nerves II through XII grossly intact no appreciable  focal deficits Psychiatric: Normal mood however with a flat affect.  Intact judgment insight.  Patient is awake, alert and oriented x3.  Eyes anxious today but does appear depressed still  Data Reviewed: I have personally reviewed following labs and imaging studies  CBC: Recent Labs  Lab 03/16/18 0900 03/17/18 0500 03/18/18 0506 03/19/18 0557 03/20/18 0310  WBC 6.3 6.0 5.3 7.3 4.7  NEUTROABS 5.0 4.8 4.1 6.3 4.5  HGB 12.6 11.9* 11.2* 10.8* 10.4*  HCT 39.0 37.5 35.6* 33.7* 32.3*  MCV 89.0 90.4 90.8 91.6 90.2  PLT 327 307 286 247 254   Basic Metabolic Panel: Recent Labs  Lab 03/16/18 0900 03/17/18 0500 03/18/18 0506 03/19/18 0557 03/20/18 0310  NA 139 140 141 134* 138  K 3.4* 4.1 3.6 4.0 3.9  CL 105 105 106 99 101  CO2 25 26 28 26 29   GLUCOSE 104* 101* 104* 103* 146*  BUN 8 6 5* 5* 6  CREATININE 0.30* 0.31* 0.31* <0.30* 0.30*  CALCIUM 9.5 9.6 9.4 9.1 9.5  MG 1.7 1.7 1.9 1.7 1.8  PHOS 2.9 3.2 3.3 3.3 3.1   GFR: Estimated Creatinine Clearance: 69 mL/min (A) (by C-G formula based on SCr of 0.3 mg/dL (L)). Liver  Function Tests: Recent Labs  Lab 03/16/18 0900 03/17/18 0500 03/18/18 0506 03/19/18 0557 03/20/18 0310  AST 21 20 20 21  59*  ALT 20 20 19 20  48*  ALKPHOS 178* 155* 140* 120 132*  BILITOT 0.7 0.4 0.6 0.7 0.6  PROT 5.8* 5.8* 5.6* 5.6* 5.9*  ALBUMIN 2.7* 2.6* 2.6* 2.3* 2.4*   No results for input(s): LIPASE, AMYLASE in the last 168 hours. No results for input(s): AMMONIA in the last 168 hours. Coagulation Profile: No results for input(s): INR, PROTIME in the last 168 hours. Cardiac Enzymes: No results for input(s): CKTOTAL, CKMB, CKMBINDEX, TROPONINI in the last 168 hours. BNP (last 3 results) No results for input(s): PROBNP in the last 8760 hours. HbA1C: No results for input(s): HGBA1C in the last 72 hours. CBG: Recent Labs  Lab 03/17/18 1643  GLUCAP 92   Lipid Profile: No results for input(s): CHOL, HDL, LDLCALC, TRIG, CHOLHDL, LDLDIRECT in the last 72 hours. Thyroid Function Tests: Recent Labs    03/18/18 0506  TSH 1.641  FREET4 1.09   Anemia Panel: No results for input(s): VITAMINB12, FOLATE, FERRITIN, TIBC, IRON, RETICCTPCT in the last 72 hours. Sepsis Labs: No results for input(s): PROCALCITON, LATICACIDVEN in the last 168 hours.  No results found for this or any previous visit (from the past 240 hour(s)).   Radiology Studies: Dg Chest Port 1 View  Result Date: 03/20/2018 CLINICAL DATA:  57 year old female with shortness of breath EXAM: PORTABLE CHEST 1 VIEW COMPARISON:  Prior chest x-ray 03/19/2018 FINDINGS: The patient is rotated to the right. This results in distortion of the cardiac and mediastinal contours. Within this limitation, similar degree of cardiomegaly. A right upper extremity PICC is present. The catheter tip projects over the mid SVC. Extensive patchy airspace opacity again noted in the right upper and right lower lobes. There may be a small associated pleural effusion. Mild atelectasis in the left lung base. Extensive bronchitic changes are  similar compared to prior. Surgical changes of prior cement augmentation in the midthoracic spine. IMPRESSION: 1. Lower inspiratory volumes with increasing bibasilar atelectasis superimposed on multifocal pneumonia in the right lung. 2. Slightly increased pulmonary vascular congestion now bordering on mild interstitial edema. 3. Stable position of right upper extremity PICC. Electronically Signed   By:  Jacqulynn Cadet M.D.   On: 03/20/2018 09:13   Dg Chest Port 1 View  Result Date: 03/19/2018 CLINICAL DATA:  Shortness of breath EXAM: PORTABLE CHEST 1 VIEW COMPARISON:  CT yesterday. FINDINGS: Right lower lobe pneumonia without visible cavitation or effusion. No pneumothorax. Atelectatic opacity at the left base. Right upper extremity PICC with tip at the SVC. Left second rib metastasis with subpleural nodule. IMPRESSION: Right lower lobe pneumonia and left lower lobe atelectasis, likely stable from CT yesterday. Electronically Signed   By: Monte Fantasia M.D.   On: 03/19/2018 07:14   Scheduled Meds: . budesonide (PULMICORT) nebulizer solution  0.25 mg Nebulization BID  . enoxaparin (LOVENOX) injection  40 mg Subcutaneous Q24H  . fentaNYL  100 mcg Transdermal Q72H  . guaiFENesin  1,200 mg Oral BID  . haloperidol  5 mg Oral BID  . ipratropium  0.5 mg Nebulization TID  . levalbuterol  0.63 mg Nebulization TID  . methylPREDNISolone (SOLU-MEDROL) injection  40 mg Intravenous BID  . metoprolol tartrate  25 mg Oral BID  . nicotine  21 mg Transdermal Daily  . rosuvastatin  20 mg Oral QPM  . senna-docusate  2 tablet Oral BID   Continuous Infusions: . ceFEPime (MAXIPIME) IV Stopped (03/20/18 1339)  . vancomycin 1,000 mg (03/20/18 0823)     LOS: 5 days   Kerney Elbe, DO [facility] PAGER is on [place]  If 7PM-7AM, please contact night-coverage [email or web address] Password [email or web address] 03/20/2018, 4:16 PM

## 2018-03-20 NOTE — Plan of Care (Signed)
  Problem: Education: Goal: Knowledge of General Education information will improve Description Including pain rating scale, medication(s)/side effects and non-pharmacologic comfort measures Outcome: Progressing   Problem: Clinical Measurements: Goal: Ability to maintain clinical measurements within normal limits will improve Outcome: Progressing Goal: Will remain free from infection Outcome: Progressing Goal: Diagnostic test results will improve Outcome: Progressing Goal: Respiratory complications will improve Outcome: Progressing Goal: Cardiovascular complication will be avoided Outcome: Progressing   Problem: Elimination: Goal: Will not experience complications related to urinary retention Outcome: Progressing   Problem: Pain Managment: Goal: General experience of comfort will improve Outcome: Progressing   Problem: Safety: Goal: Ability to remain free from injury will improve Outcome: Progressing

## 2018-03-20 NOTE — Progress Notes (Signed)
If patient discharging with PICC, please consider PICC exchange. Right upper arm PICC placed 7/8, home with patient when discharged on 7/13. Admitted 9/12 and approx 3 cm of PICC has migrated out, chest xray on 9/17 states PICC tip mid SVC. Optimal placement is low SVC/CAJ.

## 2018-03-21 ENCOUNTER — Inpatient Hospital Stay (HOSPITAL_COMMUNITY): Payer: Medicare Other

## 2018-03-21 ENCOUNTER — Ambulatory Visit: Payer: Medicare Other

## 2018-03-21 ENCOUNTER — Ambulatory Visit
Admit: 2018-03-21 | Discharge: 2018-03-21 | Disposition: A | Discharge status: Home / Self Care | Payer: Medicare Other | Attending: Radiation Oncology | Admitting: Radiation Oncology

## 2018-03-21 DIAGNOSIS — C7801 Secondary malignant neoplasm of right lung: Secondary | ICD-10-CM

## 2018-03-21 DIAGNOSIS — R0902 Hypoxemia: Secondary | ICD-10-CM

## 2018-03-21 DIAGNOSIS — J189 Pneumonia, unspecified organism: Secondary | ICD-10-CM

## 2018-03-21 DIAGNOSIS — G893 Neoplasm related pain (acute) (chronic): Principal | ICD-10-CM

## 2018-03-21 DIAGNOSIS — C7951 Secondary malignant neoplasm of bone: Secondary | ICD-10-CM

## 2018-03-21 LAB — CBC WITH DIFFERENTIAL/PLATELET
BASOS PCT: 0 %
Basophils Absolute: 0 10*3/uL (ref 0.0–0.1)
EOS ABS: 0 10*3/uL (ref 0.0–0.7)
EOS PCT: 0 %
HCT: 31.5 % — ABNORMAL LOW (ref 36.0–46.0)
Hemoglobin: 10.1 g/dL — ABNORMAL LOW (ref 12.0–15.0)
Lymphocytes Relative: 3 %
Lymphs Abs: 0.2 10*3/uL — ABNORMAL LOW (ref 0.7–4.0)
MCH: 28.9 pg (ref 26.0–34.0)
MCHC: 32.1 g/dL (ref 30.0–36.0)
MCV: 90.3 fL (ref 78.0–100.0)
MONO ABS: 0.3 10*3/uL (ref 0.1–1.0)
MONOS PCT: 4 %
NEUTROS PCT: 93 %
Neutro Abs: 6.9 10*3/uL (ref 1.7–7.7)
PLATELETS: 289 10*3/uL (ref 150–400)
RBC: 3.49 MIL/uL — ABNORMAL LOW (ref 3.87–5.11)
RDW: 16.5 % — AB (ref 11.5–15.5)
WBC: 7.5 10*3/uL (ref 4.0–10.5)

## 2018-03-21 LAB — PHOSPHORUS: PHOSPHORUS: 3.5 mg/dL (ref 2.5–4.6)

## 2018-03-21 LAB — MAGNESIUM: MAGNESIUM: 1.9 mg/dL (ref 1.7–2.4)

## 2018-03-21 MED ORDER — LEVOFLOXACIN 750 MG PO TABS
750.0000 mg | ORAL_TABLET | Freq: Every day | ORAL | Status: DC
  Administered 2018-03-21: 750 mg via ORAL
  Filled 2018-03-21: qty 1

## 2018-03-21 MED ORDER — ACETAMINOPHEN 500 MG PO TABS
1000.0000 mg | ORAL_TABLET | Freq: Four times a day (QID) | ORAL | Status: DC | PRN
  Administered 2018-03-21: 1000 mg via ORAL
  Filled 2018-03-21: qty 2

## 2018-03-21 MED ORDER — HYDROXYZINE HCL 25 MG PO TABS
25.0000 mg | ORAL_TABLET | Freq: Three times a day (TID) | ORAL | Status: DC
  Administered 2018-03-21 – 2018-03-22 (×3): 25 mg via ORAL
  Filled 2018-03-21 (×3): qty 1

## 2018-03-21 MED ORDER — HYDROMORPHONE HCL 2 MG PO TABS
4.0000 mg | ORAL_TABLET | ORAL | Status: DC | PRN
  Administered 2018-03-21 – 2018-03-22 (×6): 4 mg via ORAL
  Filled 2018-03-21 (×6): qty 2

## 2018-03-21 MED ORDER — PREDNISONE 20 MG PO TABS
40.0000 mg | ORAL_TABLET | Freq: Every day | ORAL | Status: DC
  Administered 2018-03-22: 40 mg via ORAL
  Filled 2018-03-21: qty 2

## 2018-03-21 NOTE — Progress Notes (Signed)
PROGRESS NOTE [facility]   Jill [name]   QIH:[id number] DOB: July 31, 1960  DOA: 03/15/2018 PCP: Aletha Halim., PA-C   Brief Narrative:  [name] 57 year old female with metastatic stage IV lung cancer on Keytruda, hypertension, schizophrenia and active smoker.  Patient presented to the emergency department complaining of left leg pain.  Patient admitted with working diagnosis of intractable left leg pain due to metastasis.  Radiation therapy was consulted and patient receiving palliative radiation.  Palliative care was consulted and adjusting pain management.  During hospitalization patient developed persistent hypoxia, work-up revealed pneumonia and patient was started on empiric IV antibiotics.  Subjective: Patient seen and examined, she continues to complain of leg pain 8/10, however look comfortable and moving well in bed.  No other concerns at this time.  She remains afebrile.  Continues to ask for pain medication every 3 hours on the clock.  I have discussed with patient that the goal of pain management is not to completely eradicate the pain however the goal is to bring down the number to the pain that is manageable.  Assessment & Plan: Pain of malignancy due to stage IV lung cancer with multiple metastasis. Palliative care recommendations appreciated helping with pain management.  Patient's fentanyl has been increased to maximum dose of 100 mcg every 72 hour.  Will adjust Dilaudid to 4 mg every 4 hours as needed and increase Tylenol to thousand milligrams every 6 hour as needed.  I have discussed with patient that the goal is not to have 0 pain but pain that is somewhat manageable.  Will hydroxyzine 3 times daily for anxiety which could be contributing to pain as well.  Continue radiation therapy hopefully this continues to improve symptoms.  Acute respiratory failure with hypoxia in setting of acute pneumonia/sepsis Patient continues to have persistent  postobstructive pneumonia.  Patient on oxygen at home, continue oxygen supplementation to keep O2 sat 88%.  Patient with multiple compression fractures in the thoracic spine, likely contributing to poor inspiratory effort.  Patient is currently smoker.  This will continue to delay healing process.  Smoking cessation has been discussed.  Continue supportive treatment with guaifenesin, flutter valve and incentive spirometry.  Continue nebulizer treatment.  HCAP Patient started on IV antibiotic with cefepime and vancomycin.  Patient remains afebrile and clinically improving.  Will de-escalate to Levaquin.  Also been treated with Solu-Medrol, will switch to prednisone 40 mg daily and taper for 14 days.  Continue supportive treatment  Stage IV metastatic lung cancer Patient undergoing radiation for metastatic pain, continue oncologic treatment per Dr. Earlie Server [facility] patient.  Hypokalemia Replete Check BMP and magnesium in a.m.  Tobacco abuse Cessation discussed, nicotine patch is  Normocytic anemia Of chronic illness, dilution contributing as well. No signs of overt bleeding.  Will check anemia panel in a.m. Monitor CBC  DVT prophylaxis: Enoxaparin Code Status: Full code Family Communication: None at bedside Disposition Plan: SNF in 1 to 2 days   Consultants:   Palliative care medicine  Radiation oncology  Medical oncology  Procedures:   None  Antimicrobials: Anti-infectives (From admission, onward)   Start     Dose/Rate Route Frequency Ordered Stop   03/21/18 2200  levofloxacin (LEVAQUIN) tablet 750 mg     750 mg Oral Daily at bedtime 03/21/18 1058 03/24/18 2159   03/20/18 0900  vancomycin (VANCOCIN) IVPB 1000 mg/200 mL premix  Status:  Discontinued     1,000 mg 200 mL/hr over 60 Minutes Intravenous Every 24 hours 03/19/18  7672 03/21/18 1058   03/19/18 0900  ceFEPIme (MAXIPIME) 1 g in sodium chloride 0.9 % 100 mL IVPB  Status:  Discontinued     1 g 200 mL/hr over 30 Minutes  Intravenous Every 8 hours 03/19/18 0823 03/21/18 1058   03/19/18 0830  vancomycin (VANCOCIN) 1,500 mg in sodium chloride 0.9 % 500 mL IVPB     1,500 mg 250 mL/hr over 120 Minutes Intravenous  Once 03/19/18 0823 03/19/18 1343         Objective: Vitals:   03/21/18 0945 03/21/18 1228 03/21/18 1240 03/21/18 1242  BP: 107/71 112/80    Pulse: 79 76    Resp:      Temp:  (!) 97.5 F (36.4 C)    TempSrc:  Oral    SpO2:  90% (!) 88% 94%  Weight:      Height:        Intake/Output Summary (Last 24 hours) at 03/21/2018 1500 Last data filed at 03/21/2018 0600 Gross per 24 hour  Intake 200 ml  Output -  Net 200 ml   Filed Weights   03/17/18 1814  Weight: 67.5 kg    Examination:  General exam: Appears calm and comfortable  HEENT: OP moist and clear Respiratory system: Breath sounds diminished bilaterally, some wheezing and rhonchi diffuse.  Normal respiratory effort.  Nasal cannula with oxygen supplementation ongoing. Cardiovascular system: S1 & S2 heard, RRR. No JVD, murmurs, rubs or gallops Gastrointestinal system: Abdomen is nondistended, soft and nontender. No organomegaly or masses felt. Normal bowel sounds heard. Central nervous system: Alert and oriented. No focal neurological deficits. Extremities: No pedal edema. Symmetric Skin: No rashes, lesions or ulcers Psychiatry: Judgement and insight appear normal. Mood & affect appropriate.    Data Reviewed: I have personally reviewed following labs and imaging studies  CBC: Recent Labs  Lab 03/17/18 0500 03/18/18 0506 03/19/18 0557 03/20/18 0310 03/21/18 0340  WBC 6.0 5.3 7.3 4.7 7.5  NEUTROABS 4.8 4.1 6.3 4.5 6.9  HGB 11.9* 11.2* 10.8* 10.4* 10.1*  HCT 37.5 35.6* 33.7* 32.3* 31.5*  MCV 90.4 90.8 91.6 90.2 90.3  PLT 307 286 247 269 094   Basic Metabolic Panel: Recent Labs  Lab 03/17/18 0500 03/18/18 0506 03/19/18 0557 03/20/18 0310 03/20/18 1847 03/21/18 0340  NA 140 141 134* 138 137  --   K 4.1 3.6 4.0 3.9  3.7  --   CL 105 106 99 101 100  --   CO2 26 28 26 29 28   --   GLUCOSE 101* 104* 103* 146* 182*  --   BUN 6 5* 5* 6 9  --   CREATININE 0.31* 0.31* <0.30* 0.30* 0.40*  --   CALCIUM 9.6 9.4 9.1 9.5 9.4  --   MG 1.7 1.9 1.7 1.8  --  1.9  PHOS 3.2 3.3 3.3 3.1  --  3.5   GFR: Estimated Creatinine Clearance: 69 mL/min (A) (by C-G formula based on SCr of 0.4 mg/dL (L)). Liver Function Tests: Recent Labs  Lab 03/17/18 0500 03/18/18 0506 03/19/18 0557 03/20/18 0310 03/20/18 1847  AST 20 20 21  59* 99*  ALT 20 19 20  48* 93*  ALKPHOS 155* 140* 120 132* 123  BILITOT 0.4 0.6 0.7 0.6 0.3  PROT 5.8* 5.6* 5.6* 5.9* 5.6*  ALBUMIN 2.6* 2.6* 2.3* 2.4* 2.3*   No results for input(s): LIPASE, AMYLASE in the last 168 hours. No results for input(s): AMMONIA in the last 168 hours. Coagulation Profile: No results for input(s): INR, PROTIME in  the last 168 hours. Cardiac Enzymes: No results for input(s): CKTOTAL, CKMB, CKMBINDEX, TROPONINI in the last 168 hours. BNP (last 3 results) No results for input(s): PROBNP in the last 8760 hours. HbA1C: No results for input(s): HGBA1C in the last 72 hours. CBG: Recent Labs  Lab 03/17/18 1643  GLUCAP 92   Lipid Profile: No results for input(s): CHOL, HDL, LDLCALC, TRIG, CHOLHDL, LDLDIRECT in the last 72 hours. Thyroid Function Tests: No results for input(s): TSH, T4TOTAL, FREET4, T3FREE, THYROIDAB in the last 72 hours. Anemia Panel: No results for input(s): VITAMINB12, FOLATE, FERRITIN, TIBC, IRON, RETICCTPCT in the last 72 hours. Sepsis Labs: No results for input(s): PROCALCITON, LATICACIDVEN in the last 168 hours.  No results found for this or any previous visit (from the past 240 hour(s)).    Radiology Studies: Dg Chest Port 1 View  Result Date: 03/21/2018 CLINICAL DATA:  Shortness of breath EXAM: PORTABLE CHEST 1 VIEW COMPARISON:  03/20/2018 FINDINGS: Cardiac shadow is stable in appearance. Right-sided PICC line is again identified and  stable. Patient rotation to the right is again seen. Patchy infiltrative changes are noted particularly on the right but to a lesser degree in the left base. These are stable in appearance from the prior exam. No new focal abnormality is seen. IMPRESSION: No change from the previous day. Electronically Signed   By: Inez Catalina M.D.   On: 03/21/2018 10:07   Dg Chest Port 1 View  Result Date: 03/20/2018 CLINICAL DATA:  57 year old female with shortness of breath EXAM: PORTABLE CHEST 1 VIEW COMPARISON:  Prior chest x-ray 03/19/2018 FINDINGS: The patient is rotated to the right. This results in distortion of the cardiac and mediastinal contours. Within this limitation, similar degree of cardiomegaly. A right upper extremity PICC is present. The catheter tip projects over the mid SVC. Extensive patchy airspace opacity again noted in the right upper and right lower lobes. There may be a small associated pleural effusion. Mild atelectasis in the left lung base. Extensive bronchitic changes are similar compared to prior. Surgical changes of prior cement augmentation in the midthoracic spine. IMPRESSION: 1. Lower inspiratory volumes with increasing bibasilar atelectasis superimposed on multifocal pneumonia in the right lung. 2. Slightly increased pulmonary vascular congestion now bordering on mild interstitial edema. 3. Stable position of right upper extremity PICC. Electronically Signed   By: Jacqulynn Cadet M.D.   On: 03/20/2018 09:13    Scheduled Meds: . budesonide (PULMICORT) nebulizer solution  0.25 mg Nebulization BID  . enoxaparin (LOVENOX) injection  40 mg Subcutaneous Q24H  . fentaNYL  100 mcg Transdermal Q72H  . guaiFENesin  1,200 mg Oral BID  . haloperidol  5 mg Oral BID  . hydrOXYzine  25 mg Oral TID  . ipratropium  0.5 mg Nebulization TID  . levalbuterol  0.63 mg Nebulization TID  . levofloxacin  750 mg Oral QHS  . metoprolol tartrate  25 mg Oral BID  . nicotine  21 mg Transdermal Daily  .  [START ON 03/22/2018] predniSONE  40 mg Oral Q breakfast  . rosuvastatin  20 mg Oral QPM  . senna-docusate  2 tablet Oral BID   Continuous Infusions:   LOS: 6 days    Time spent: Total of 25 minutes spent with pt, greater than 50% of which was spent in discussion of  treatment, counseling and coordination of care   Chipper Oman, MD Pager: Text Page via [email or web address]   If 7PM-7AM, please contact night-coverage [email or web address] 03/21/2018, 3:00 PM   Note -  This record has been created using [employer]. Chart creation errors have been sought, but may not always have been located. Such creation errors do not reflect on the standard of medical care.

## 2018-03-21 NOTE — Clinical Social Work Placement (Signed)
   CLINICAL SOCIAL WORK PLACEMENT  NOTE  Date:  03/21/2018  Patient Details  Name: Jill [name] MRN: [id number] Date of Birth: 1961-02-25  Clinical Social Work is seeking post-discharge placement for this patient at the [place] level of care (*CSW will initial, date and re-position this form in  chart as items are completed):  Yes   Patient/family provided with [place] Work Department's list of facilities offering this level of care within the geographic area requested by the patient (or if unable, by the patient's family).  Yes   Patient/family informed of their freedom to choose among providers that offer the needed level of care, that participate in Medicare, Medicaid or managed care program needed by the patient, have an available bed and are willing to accept the patient.  Yes   Patient/family informed of [facility]'s ownership interest in [facility] and [facility], as well as of the fact that they are under no obligation to receive care at these facilities.  PASRR submitted to EDS on 03/16/18     PASRR number received on 03/20/18     Existing PASRR number confirmed on       FL2 transmitted to all facilities in geographic area requested by pt/family on 03/16/18     FL2 transmitted to all facilities within larger geographic area on       Patient informed that his/her managed care company has contracts with or will negotiate with certain facilities, including the following:        Yes   Patient/family informed of bed offers received.  Patient chooses bed at [facility]     Physician recommends and patient chooses bed at      Patient to be transferred to   on  .  Patient to be transferred to facility by       Patient family notified on   of transfer.  Name of family member notified:        PHYSICIAN       Additional Comment:    _______________________________________________ Burnis Medin,  LCSW 03/21/2018, 1:42 PM

## 2018-03-21 NOTE — Progress Notes (Signed)
[facility] Therapy Treatment Record Phone [phone]   Radiation Therapy was administered to Jill [name] on: 03/21/2018  11:52 AM and was treatment # 1 out of a planned course of 1 treatments.  Radiation Treatment  1). Beam photons with 6-10 energy  2). Brachytherapy None  3). Stereotactic Radiosurgery None  4). Other Radiation None     Krisandra Bueno F Pa Tennant, RT (T)

## 2018-03-21 NOTE — Progress Notes (Signed)
PT Cancellation Note  Patient Details Name: MEESHA SEK MRN: [id number] DOB: 02-08-1961   Cancelled Treatment:    Reason Eval/Treat Not Completed: Pain limiting ability to participate; attempted earlier today and pt reported just back from radiation.  Now she reports hip too painful to walk.  I did help her sit up on EOB to eat dinner.  Feel she has some level of depression also limiting mobility. Will continue attempts.    Reginia Naas 03/21/2018, 5:05 PM  Magda Kiel, [place] [phone] 03/21/2018

## 2018-03-22 ENCOUNTER — Ambulatory Visit: Payer: Medicare Other

## 2018-03-22 ENCOUNTER — Inpatient Hospital Stay: Payer: Self-pay

## 2018-03-22 ENCOUNTER — Ambulatory Visit: Payer: Medicare Other | Admitting: Internal Medicine

## 2018-03-22 ENCOUNTER — Other Ambulatory Visit: Payer: Medicare Other

## 2018-03-22 LAB — BASIC METABOLIC PANEL
Anion gap: 10 (ref 5–15)
BUN: 10 mg/dL (ref 6–20)
CALCIUM: 9.9 mg/dL (ref 8.9–10.3)
CO2: 27 mmol/L (ref 22–32)
CREATININE: 0.35 mg/dL — AB (ref 0.44–1.00)
Chloride: 105 mmol/L (ref 98–111)
GFR calc Af Amer: >60 mL/min (ref >60)
GFR calc non Af Amer: >60 mL/min (ref >60)
GLUCOSE: 79 mg/dL (ref 70–99)
Potassium: 3.3 mmol/L — ABNORMAL LOW (ref 3.5–5.1)
Sodium: 142 mmol/L (ref 135–145)

## 2018-03-22 LAB — CBC WITH DIFFERENTIAL/PLATELET
BASOS PCT: 0 %
Basophils Absolute: 0 10*3/uL (ref 0.0–0.1)
Eosinophils Absolute: 0.1 10*3/uL (ref 0.0–0.7)
Eosinophils Relative: 1 %
HEMATOCRIT: 36.9 % (ref 36.0–46.0)
Hemoglobin: 11.7 g/dL — ABNORMAL LOW (ref 12.0–15.0)
LYMPHS PCT: 13 %
Lymphs Abs: 1.2 10*3/uL (ref 0.7–4.0)
MCH: 28.7 pg (ref 26.0–34.0)
MCHC: 31.7 g/dL (ref 30.0–36.0)
MCV: 90.4 fL (ref 78.0–100.0)
MONO ABS: 0.5 10*3/uL (ref 0.1–1.0)
Monocytes Relative: 6 %
NEUTROS ABS: 7.5 10*3/uL (ref 1.7–7.7)
Neutrophils Relative %: 80 %
Platelets: 300 10*3/uL (ref 150–400)
RBC: 4.08 MIL/uL (ref 3.87–5.11)
RDW: 16.3 % — AB (ref 11.5–15.5)
WBC: 9.4 10*3/uL (ref 4.0–10.5)

## 2018-03-22 LAB — MAGNESIUM: Magnesium: 1.9 mg/dL (ref 1.7–2.4)

## 2018-03-22 MED ORDER — HYDROMORPHONE HCL 4 MG PO TABS: 4.0000 mg | ORAL_TABLET | ORAL | 0 refills | Status: DC | PRN

## 2018-03-22 MED ORDER — PREDNISONE 10 MG PO TABS: ORAL_TABLET | ORAL | 0 refills | Status: DC

## 2018-03-22 MED ORDER — HYDROXYZINE HCL 25 MG PO TABS: 25.0000 mg | ORAL_TABLET | Freq: Three times a day (TID) | ORAL | 0 refills | Status: AC

## 2018-03-22 MED ORDER — POTASSIUM CHLORIDE CRYS ER 20 MEQ PO TBCR
40.0000 meq | EXTENDED_RELEASE_TABLET | Freq: Once | ORAL | Status: AC
  Administered 2018-03-22: 40 meq via ORAL
  Filled 2018-03-22: qty 2

## 2018-03-22 MED ORDER — SODIUM CHLORIDE 0.9% FLUSH: 10.0000 mL | INTRAVENOUS | Status: DC | PRN

## 2018-03-22 MED ORDER — METOPROLOL TARTRATE 25 MG PO TABS: 25.0000 mg | ORAL_TABLET | Freq: Two times a day (BID) | ORAL | Status: AC

## 2018-03-22 MED ORDER — LEVOFLOXACIN 750 MG PO TABS: 750.0000 mg | ORAL_TABLET | Freq: Every day | ORAL | 0 refills | Status: DC

## 2018-03-22 MED ORDER — HALOPERIDOL 5 MG PO TABS: 5.0000 mg | ORAL_TABLET | Freq: Two times a day (BID) | ORAL | 0 refills | Status: AC

## 2018-03-22 MED ORDER — FENTANYL 100 MCG/HR TD PT72: 100.0000 ug | MEDICATED_PATCH | TRANSDERMAL | 0 refills | Status: AC

## 2018-03-22 MED ORDER — GUAIFENESIN ER 600 MG PO TB12: 1200.0000 mg | ORAL_TABLET | Freq: Two times a day (BID) | ORAL | 0 refills | Status: DC

## 2018-03-22 NOTE — Clinical Social Work Placement (Signed)
Patient received and accepted bed offer at [place] at [facility]. Facility aware of patient's discharge and confirmed bed offer. PTAR contacted, patient declined for CSW to contact family. Patient's RN can call report to [phone] Room 111, packet complete. CSW signing off, no other needs identified at this time.  CLINICAL SOCIAL WORK PLACEMENT  NOTE  Date:  03/22/2018  Patient Details  Name: Jill [name] MRN: [id number] Date of Birth: 16-Sep-1960  Clinical Social Work is seeking post-discharge placement for this patient at the [place] level of care (*CSW will initial, date and re-position this form in  chart as items are completed):  Yes   Patient/family provided with [place] Work Department's list of facilities offering this level of care within the geographic area requested by the patient (or if unable, by the patient's family).  Yes   Patient/family informed of their freedom to choose among providers that offer the needed level of care, that participate in Medicare, Medicaid or managed care program needed by the patient, have an available bed and are willing to accept the patient.  Yes   Patient/family informed of [facility]'s ownership interest in [facility] and [facility], as well as of the fact that they are under no obligation to receive care at these facilities.  PASRR submitted to EDS on 03/16/18     PASRR number received on 03/20/18     Existing PASRR number confirmed on       FL2 transmitted to all facilities in geographic area requested by pt/family on 03/16/18     FL2 transmitted to all facilities within larger geographic area on       Patient informed that his/her managed care company has contracts with or will negotiate with certain facilities, including the following:        Yes   Patient/family informed of bed offers received.  Patient chooses bed at [facility]     Physician  recommends and patient chooses bed at      Patient to be transferred to [facility] on 03/22/18.  Patient to be transferred to facility by PTAR     Patient family notified on 03/22/18 of transfer.  Name of family member notified:  Patient declined for CSW to contact family     PHYSICIAN       Additional Comment:    _______________________________________________ Burnis Medin, LCSW 03/22/2018, 12:23 PM

## 2018-03-22 NOTE — Care Management Note (Signed)
Case Management Note  Patient Details  Name: Jill [name] MRN: [id number] Date of Birth: [birth year]  Subjective/Objective: Medically stable for d/c SNF w/PCS today-CSW following.                   Action/Plan:dc SNF w/PCS.   Expected Discharge Date:  (unknown)               Expected Discharge Plan:  Skilled Nursing Facility  In-House Referral:  Clinical Social Work  Discharge planning Services  CM Consult  Post Acute Care Choice:    Choice offered to:     DME Arranged:    DME Agency:     HH Arranged:    [place] Agency:     Status of Service:  Completed, signed off  If discussed at [employer], dates discussed:    Additional Comments:  Dessa Phi, RN 03/22/2018, 10:46 AM

## 2018-03-22 NOTE — Progress Notes (Signed)
Patient ID: Jill [name], female   DOB: [birth year], 57 y.o.   MRN: [id number]  This NP visited patient at the bedside for ongoing pain medication adjustments, palliative medicine needs and emotional support.  Patient is awake and oriented, and reports pain is "better" and she feels ready to transition to SNF for rehab " hope I can go today ". Continued education on the goal of pain control being more about increasing function as we try to achieve sufficient pain control   Continue:    - Fentanyl 100 mcg patch as directed    - Dilaudid to 4  mg po every 4 hrs as needed for breakthrough pain       - Recommend palliative services on discharge       Discussed with patient the importance of documentation of a HPOA and and an AD, I stressed the importance of CODE STATUS and utilization of artificial feeding and hydration.  She does tell me that if she was unable to speak for herself she would trust that her brother Jill [name] would speak in her best interest  Discussed with patient the importance of continued conversation with her  medical providers regarding overall plan of care and treatment options,  ensuring decisions are within the context of the patients values and GOCs.   Questions and concerns addressed     Total time spent on the unit was 35 minutes   Discussed with Dr Quincy Simmonds  Greater than 50% of the time was spent in counseling and coordination of care  Wadie Lessen NP  Palliative Medicine Team Team Phone # [phone] Pager [phone]

## 2018-03-22 NOTE — Progress Notes (Signed)
Peripherally Inserted Central Catheter/Midline Placement/ Exchange PICC exchanged per protocol without difficulty.  The IV Nurse has discussed with the patient and/or persons authorized to consent for the patient, the purpose of this procedure and the potential benefits and risks involved with this procedure.  The benefits include less needle sticks, lab draws from the catheter, and the patient may be discharged home with the catheter. Risks include, but not limited to, infection, bleeding, blood clot (thrombus formation), and puncture of an artery; nerve damage and irregular heartbeat and possibility to perform a PICC exchange if needed/ordered by physician.  Alternatives to this procedure were also discussed.  Bard Power PICC patient education guide, fact sheet on infection prevention and patient information card has been provided to patient /or left at bedside.    PICC/Midline Placement Documentation  PICC Single Lumen 03/22/18 PICC Right Cephalic 40 cm 0 cm (Active)  Indication for Insertion or Continuance of Line Home intravenous therapies (PICC only) 03/22/2018 10:43 AM  Exposed Catheter (cm) 0 cm 03/22/2018 10:43 AM  Site Assessment Clean;Dry;Intact 03/22/2018 10:43 AM  Line Status Flushed;Saline locked;Blood return noted 03/22/2018 10:43 AM  Dressing Type Transparent;Securing device 03/22/2018 10:43 AM  Dressing Status Clean;Dry;Intact;Antimicrobial disc in place 03/22/2018 10:43 AM  Dressing Change Due 03/29/18 03/22/2018 10:43 AM       Frances Maywood 03/22/2018, 10:45 AM

## 2018-03-22 NOTE — Progress Notes (Signed)
PT Cancellation Note  Patient Details Name: Jill [name] MRN: [id number] DOB: Apr 18, 1961   Cancelled Treatment:    Reason Eval/Treat Not Completed: Patient declined, no reason specified - Pt to be d/c to SNF today, pt declines PT today due to this.   Julien Girt, PT Acute Rehabilitation Services Pager [phone]  Office [phone]    Jill [name] 03/22/2018, 12:50 PM

## 2018-03-22 NOTE — Progress Notes (Signed)
Pt to be discharged to [facility]. Carleene Cooper RN accepting report for this facility.

## 2018-03-22 NOTE — Discharge Summary (Signed)
Physician Discharge Summary  Jill [name]  ASN:[id number]  DOB: 17-Mar-1961  DOA: 03/15/2018 PCP: Aletha Halim., PA-C  Admit date: 03/15/2018 Discharge date: 03/22/2018  Admitted From: Home  Disposition: SNF   Recommendations for Outpatient Follow-up:  1. Follow up with SNF provider at earliest convenience 2. Please obtain BMP/CBC in one week to monitor electrolytes and hemoglobin 3. Follow-up with oncology in 1 to 2 weeks 4. Palliative care consult at SNF  Discharge Condition: Stable CODE STATUS: Full code Diet recommendation: Regular  Brief/Interim Summary: For full details see H&P/Progress note, but in brief, Jill [name] is a 57 year old female with metastatic stage IV lung cancer on Keytruda, hypertension, schizophrenia and active smoker.  Patient presented to the emergency department complaining of left leg pain.  Patient admitted with working diagnosis of intractable left leg pain due to metastasis.  Radiation therapy was consulted and patient receiving palliative radiation.  Palliative care was consulted and adjusting pain management.  During hospitalization patient developed persistent hypoxia, work-up revealed pneumonia and patient was started on empiric IV antibiotics.  Subjective: Patient seen and examined, she feels much better.  Able to sit down.  Pain is significantly improved.  No acute events overnight.  Remains afebrile.  Discharge Diagnoses/Hospital Course:  Pain of malignancy due to stage IV lung cancer with multiple metastasis. Patient's fentanyl was increased to maximum dose of 100 mcg every 72 hour. Patient placed on Dilaudid to 4 mg every 4 hours as needed and Tylenol was increased to 1000 mg every 6 hour as needed.  Patient received palliative radiation which help with pain improvement.  Pain is well tolerated in current pain regimen. I have discussed with patient that the goal is not to have 0 pain but pain that is somewhat manageable. Hydroxyzine 3 times  daily for anxiety which was contributing to pain as well.    Acute respiratory failure with hypoxia in setting of acute pneumonia/sepsis Patient continues to have persistent postobstructive pneumonia.  Patient on oxygen at home, continue oxygen supplementation to keep O2 sat 88%.  Patient with multiple compression fractures in the thoracic spine, likely contributing to poor inspiratory effort.  Patient is currently active smoker.  This will delay healing process.  Smoking cessation has been discussed.  Continue supportive treatment with guaifenesin, flutter valve and incentive spirometry.  Continue nebulizer treatment.  Treat underlying causes.  HCAP Patient initially on IV antibiotic with cefepime and vancomycin.  Patient remains afebrile and clinically improving.  She was de-escalated to Levaquin, will complete 7 days of therapy.  Patient was also treated with Solu-Medrol, frequently switched to prednisone 40 mg daily and taper for 14 days.    Stage IV metastatic lung cancer Patient undergoing radiation for metastatic pain, continue oncologic treatment per Dr. Earlie Server.  Hypokalemia Repleted Check BMP and magnesium in 1 week  Tobacco abuse Cessation discussed, continue nicotine patch.  Sinus tachycardia  Started on metoprolol 25 mg BID, consider d/c this as outpatient if no further symptoms and BP stable.   Normocytic anemia Anemia of chronic illness, dilation contributed as well.  No signs of overt bleeding.  Recommend to check anemia panel as an outpatient.  Check CBC in 1 week.  All other chronic medical condition were stable during the hospitalization.  Patient was seen by physical therapy, recommending SNF for SRT On the day of the discharge the patient's vitals were stable, and no other acute medical condition were reported by patient. the patient was felt safe to be discharge to SNF  Discharge Instructions  You were cared for by a hospitalist during your hospital stay. If  you have any questions about your discharge medications or the care you received while you were in the hospital after you are discharged, you can call the unit and asked to speak with the hospitalist on call if the hospitalist that took care of you is not available. Once you are discharged, your primary care physician will handle any further medical issues. Please note that NO REFILLS for any discharge medications will be authorized once you are discharged, as it is imperative that you return to your primary care physician (or establish a relationship with a primary care physician if you do not have one) for your aftercare needs so that they can reassess your need for medications and monitor your lab values.  Discharge Instructions    Call MD for:  difficulty breathing, headache or visual disturbances   Complete by:  As directed    Call MD for:  extreme fatigue   Complete by:  As directed    Call MD for:  hives   Complete by:  As directed    Call MD for:  persistant dizziness or light-headedness   Complete by:  As directed    Call MD for:  persistant nausea and vomiting   Complete by:  As directed    Call MD for:  redness, tenderness, or signs of infection (pain, swelling, redness, odor or green/yellow discharge around incision site)   Complete by:  As directed    Call MD for:  severe uncontrolled pain   Complete by:  As directed    Call MD for:  temperature >100.4   Complete by:  As directed    Diet general   Complete by:  As directed    Increase activity slowly   Complete by:  As directed      Allergies as of 03/22/2018      Reactions   Ibuprofen Nausea And Vomiting   Morphine And Related    Headaches, Vomiting   Prednisone    Dizziness and pain      Medication List    STOP taking these medications   fentaNYL 25 MCG/HR patch Commonly known as:  DURAGESIC - dosed mcg/hr Replaced by:  fentaNYL 100 MCG/HR   ondansetron 8 MG tablet Commonly known as:  ZOFRAN   oxyCODONE 5 MG  immediate release tablet Commonly known as:  Oxy IR/ROXICODONE     TAKE these medications   acetaminophen 500 MG tablet Commonly known as:  TYLENOL Take 1,000 mg by mouth every 6 (six) hours as needed for moderate pain.   fentaNYL 100 MCG/HR Commonly known as:  DURAGESIC - dosed mcg/hr Place 1 patch (100 mcg total) onto the skin every 3 (three) days. Replaces:  fentaNYL 25 MCG/HR patch   guaiFENesin 600 MG 12 hr tablet Commonly known as:  MUCINEX Take 2 tablets (1,200 mg total) by mouth 2 (two) times daily for 4 days.   haloperidol 5 MG tablet Commonly known as:  HALDOL Take 5 mg by mouth 2 (two) times daily.   HYDROmorphone 4 MG tablet Commonly known as:  DILAUDID Take 1 tablet (4 mg total) by mouth every 4 (four) hours as needed for moderate pain or severe pain.   hydrOXYzine 25 MG tablet Commonly known as:  ATARAX/VISTARIL Take 1 tablet (25 mg total) by mouth 3 (three) times daily.   ipratropium-albuterol 0.5-2.5 (3) MG/3ML Soln Commonly known as:  DUONEB Take 3 mLs by nebulization  every 4 (four) hours as needed.   levofloxacin 750 MG tablet Commonly known as:  LEVAQUIN Take 1 tablet (750 mg total) by mouth at bedtime for 2 days.   metoprolol tartrate 25 MG tablet Commonly known as:  LOPRESSOR Take 1 tablet (25 mg total) by mouth 2 (two) times daily.   nicotine 21 mg/24hr patch Commonly known as:  NICODERM CQ - dosed in mg/24 hours Place 21 mg onto the skin daily.   polyethylene glycol packet Commonly known as:  MIRALAX / [place] 1 PACKET TWICE DAILY What changed:  See the new instructions.   predniSONE 10 MG tablet Commonly known as:  DELTASONE Take 4 tablets for 3 days; Take 3 tablets for 4 days; Take 2 tablets for 3 days; Take 1 tablet for 4 days   rosuvastatin 20 MG tablet Commonly known as:  CRESTOR Take 20 mg by mouth every evening.   senna-docusate 8.6-50 MG tablet Commonly known as:  Senokot-S Take 1 tablet by mouth 2 (two) times  daily.       Allergies  Allergen Reactions  . Ibuprofen Nausea And Vomiting  . Morphine And Related     Headaches, Vomiting  . Prednisone     Dizziness and pain    Consultations:  Palliative care  Radiation oncology   Procedures/Studies: Dg Chest 2 View  Result Date: 03/15/2018 CLINICAL DATA:  Cough. EXAM: CHEST - 2 VIEW COMPARISON:  Radiographs and CT 01/06/2018 FINDINGS: Tip of the right central line in the mid SVC. Progressive interstitial opacities from prior exam suspicious for pulmonary edema. Mild volume loss in the right lung. Right lower lobe mass on prior CT is not well seen radiographically. Unchanged heart size and mediastinal contours. No pneumothorax or large pleural effusion. IMPRESSION: 1. Diffuse interstitial opacities suspicious for pulmonary edema. 2. Right lung volume loss. Right lower lobe mass on prior CT not well seen radiographically. 3. Tip of the right upper extremity PICC in the SVC. Electronically Signed   By: Keith Rake M.D.   On: 03/15/2018 07:41   Ct Angio Chest Pe W Or Wo Contrast  Result Date: 03/18/2018 CLINICAL DATA:  Shortness of breath and weakness for several months EXAM: CT ANGIOGRAPHY CHEST WITH CONTRAST TECHNIQUE: Multidetector CT imaging of the chest was performed using the standard protocol during bolus administration of intravenous contrast. Multiplanar CT image reconstructions and MIPs were obtained to evaluate the vascular anatomy. CONTRAST:  172mL ISOVUE-370 IOPAMIDOL (ISOVUE-370) INJECTION 76% COMPARISON:  Chest x-ray from 03/15/2018, CT from 01/07/2018 FINDINGS: Cardiovascular: Thoracic aorta demonstrates atherosclerotic calcifications. Mild aneurysmal dilatation of the ascending aorta 4 cm is noted. No dissection is seen. No cardiac enlargement is noted. No pericardial effusion is seen. Coronary calcifications are noted. The pulmonary artery demonstrates a normal branching pattern. No filling defects are identified to suggest  pulmonary emboli. Right-sided PICC line is noted. Mediastinum/Nodes: Thoracic inlet is within normal limits. Scattered small mediastinal lymph nodes are noted relatively similar to that seen on the prior exam. None of these are significant by size criteria. Some right hilar adenopathy is again identified similar to that seen on recent CT examination. The right infrahilar mass lesion is not as well appreciated on today's exam. It demonstrates significant reduction in bulk when compare with the prior exam. Measurement is somewhat difficult due to adjacent lymph nodes. The esophagus is within normal limits. Lungs/Pleura: Emphysematous changes of lungs are noted. The left lung is clear with the exception of mild atelectatic changes. Considerable right lower  lobe infiltrate is seen consistent with acute pneumonic infiltrate. The degree of consolidation has improved when compared with the prior CT examination. Right middle lobe nodule is again identified stable from the prior exams. It did not demonstrate hypermetabolic activity on prior PET-CT. Upper Abdomen: Visualized upper abdomen reveals no acute abnormality. Musculoskeletal: Degenerative changes of the thoracic spine are noted. There are changes consistent with prior kyphoplasty at T7. Adjacent compression deformity at T6 is noted which is new from the prior CT examination. Additionally compression deformities at T10 and T11 are noted new from the prior exam. Lytic lesion is noted in the distal aspect of the left clavicle as well as a lytic lesion in the left second rib laterally. These have increased in size most marked on the left rib lesion with the soft tissue component now measuring approximately 3.3 cm. Expansile lesion is noted in the left seventh rib anteriorly with soft tissue component. Expansile lesion is also noted within the spinous process at T11. Review of the MIP images confirms the above findings. IMPRESSION: No evidence of acute pulmonary emboli.  Significant reduction in size of the right infrahilar mass lesion with improvement of previously seen postobstructive consolidation in the right lower lobe. Persistent acute pneumonic infiltrate is noted within the right lower lobe and to a lesser degree in the right upper lobe. Previously treated T7 compression deformity. New compression deformities are noted at T6, T10 and T11. No definitive lytic lesion is identified although pathologic fracture cannot be totally excluded on this exam. Bony metastatic disease which has increased in the interval from the prior exam. Stable right middle lobe nodule which has been previously shown to be non hypermetabolic. Ascending thoracic aortic aneurysm. Recommend annual imaging followup by CTA or MRA. This recommendation follows 2010 ACCF/AHA/AATS/ACR/ASA/SCA/SCAI/SIR/STS/SVM Guidelines for the Diagnosis and Management of Patients with Thoracic Aortic Disease. Circulation. 2010; 121: P295-J884 Aortic Atherosclerosis (ICD10-I70.0) and Emphysema (ICD10-J43.9). Electronically Signed   By: Inez Catalina M.D.   On: 03/18/2018 13:42   Dg Chest Port 1 View  Result Date: 03/21/2018 CLINICAL DATA:  Shortness of breath EXAM: PORTABLE CHEST 1 VIEW COMPARISON:  03/20/2018 FINDINGS: Cardiac shadow is stable in appearance. Right-sided PICC line is again identified and stable. Patient rotation to the right is again seen. Patchy infiltrative changes are noted particularly on the right but to a lesser degree in the left base. These are stable in appearance from the prior exam. No new focal abnormality is seen. IMPRESSION: No change from the previous day. Electronically Signed   By: Inez Catalina M.D.   On: 03/21/2018 10:07   Dg Chest Port 1 View  Result Date: 03/20/2018 CLINICAL DATA:  57 year old female with shortness of breath EXAM: PORTABLE CHEST 1 VIEW COMPARISON:  Prior chest x-ray 03/19/2018 FINDINGS: The patient is rotated to the right. This results in distortion of the cardiac and  mediastinal contours. Within this limitation, similar degree of cardiomegaly. A right upper extremity PICC is present. The catheter tip projects over the mid SVC. Extensive patchy airspace opacity again noted in the right upper and right lower lobes. There may be a small associated pleural effusion. Mild atelectasis in the left lung base. Extensive bronchitic changes are similar compared to prior. Surgical changes of prior cement augmentation in the midthoracic spine. IMPRESSION: 1. Lower inspiratory volumes with increasing bibasilar atelectasis superimposed on multifocal pneumonia in the right lung. 2. Slightly increased pulmonary vascular congestion now bordering on mild interstitial edema. 3. Stable position of right upper extremity PICC. Electronically  Signed   By: Jacqulynn Cadet M.D.   On: 03/20/2018 09:13   Dg Chest Port 1 View  Result Date: 03/19/2018 CLINICAL DATA:  Shortness of breath EXAM: PORTABLE CHEST 1 VIEW COMPARISON:  CT yesterday. FINDINGS: Right lower lobe pneumonia without visible cavitation or effusion. No pneumothorax. Atelectatic opacity at the left base. Right upper extremity PICC with tip at the SVC. Left second rib metastasis with subpleural nodule. IMPRESSION: Right lower lobe pneumonia and left lower lobe atelectasis, likely stable from CT yesterday. Electronically Signed   By: Monte Fantasia M.D.   On: 03/19/2018 07:14   Dg Chest Port 1 View  Result Date: 03/15/2018 CLINICAL DATA:  Shortness of Breath EXAM: PORTABLE CHEST 1 VIEW COMPARISON:  March 15, 2018 study obtained earlier in the day. Chest CT January 07, 2018 FINDINGS: Central catheter tip is in the superior vena cava. There is airspace opacity in the right lower lobe with consolidation in the medial right base, stable. Previous mass in the right perihilar region seen on CT is not well seen by radiography. There is slight underlying interstitial edema. Heart is mildly enlarged with slight pulmonary venous hypertension.  No adenopathy is appreciable by radiography. There is an expansile lesion involving the posterior left second rib with soft tissue opacity in the left apex. Patient appears to have had kyphoplasty at T7. Lytic lesion noted in the lateral left clavicle. IMPRESSION: Question pneumonia in the right base with fibrosis, possibly due to a degree of previous radiation therapy. Appearance stable compared to earlier in the day. Pulmonary vascular congestion with mild interstitial edema, stable. Central catheter tip in superior vena cava.  No pneumothorax. Expansile lytic lesion involving the left posterior second rib. A lytic lesion is also noted in the lateral left clavicle. These are lesions felt to be consistent with metastatic foci. Electronically Signed   By: Lowella Grip III M.D.   On: 03/15/2018 08:52   Dg Femur Min 2 Views Left  Result Date: 03/15/2018 CLINICAL DATA:  Known left femur metastasis. EXAM: LEFT FEMUR 2 VIEWS COMPARISON:  None. FINDINGS: 3.3 cm lytic lesion in the proximal femoral metaphysis is compatible with metastatic involvement. No additional femoral lesions evident. No fracture. IMPRESSION: 3.3 cm metastatic lesion proximal left femoral metaphysis. Electronically Signed   By: Misty Stanley M.D.   On: 03/15/2018 20:35   [place] Ekg Site Rite  Result Date: 03/22/2018 If Site Rite image not attached, placement could not be confirmed due to current cardiac rhythm.    Discharge Exam: Vitals:   03/22/18 0824 03/22/18 0827  BP:    Pulse:    Resp:    Temp:    SpO2: 91% 91%   Vitals:   03/21/18 2130 03/22/18 0438 03/22/18 0824 03/22/18 0827  BP: 117/70 137/85    Pulse: 80 (!) 101    Resp: 16 20    Temp: 97.6 F (36.4 C) 98.1 F (36.7 C)    TempSrc: Oral Oral    SpO2: 92% 92% 91% 91%  Weight:      Height:        General: Pt is alert, awake, not in acute distress Cardiovascular: RRR, S1/S2 +, no rubs, no gallops Respiratory: CTA bilaterally, no wheezing, no  rhonchi Abdominal: Soft, NT, ND, bowel sounds + Extremities: no edema  The results of significant diagnostics from this hospitalization (including imaging, microbiology, ancillary and laboratory) are listed below for reference.     Microbiology: No results found for this or any previous visit (from  the past 240 hour(s)).   Labs: BNP (last 3 results) Recent Labs    01/06/18 2318  BNP 24.4   Basic Metabolic Panel: Recent Labs  Lab 03/17/18 0500 03/18/18 0506 03/19/18 0557 03/20/18 0310 03/20/18 1847 03/21/18 0340 03/22/18 0450  NA 140 141 134* 138 137  --  142  K 4.1 3.6 4.0 3.9 3.7  --  3.3*  CL 105 106 99 101 100  --  105  CO2 26 28 26 29 28   --  27  GLUCOSE 101* 104* 103* 146* 182*  --  79  BUN 6 5* 5* 6 9  --  10  CREATININE 0.31* 0.31* <0.30* 0.30* 0.40*  --  0.35*  CALCIUM 9.6 9.4 9.1 9.5 9.4  --  9.9  MG 1.7 1.9 1.7 1.8  --  1.9 1.9  PHOS 3.2 3.3 3.3 3.1  --  3.5  --    Liver Function Tests: Recent Labs  Lab 03/17/18 0500 03/18/18 0506 03/19/18 0557 03/20/18 0310 03/20/18 1847  AST 20 20 21  59* 99*  ALT 20 19 20  48* 93*  ALKPHOS 155* 140* 120 132* 123  BILITOT 0.4 0.6 0.7 0.6 0.3  PROT 5.8* 5.6* 5.6* 5.9* 5.6*  ALBUMIN 2.6* 2.6* 2.3* 2.4* 2.3*   No results for input(s): LIPASE, AMYLASE in the last 168 hours. No results for input(s): AMMONIA in the last 168 hours. CBC: Recent Labs  Lab 03/18/18 0506 03/19/18 0557 03/20/18 0310 03/21/18 0340 03/22/18 0450  WBC 5.3 7.3 4.7 7.5 9.4  NEUTROABS 4.1 6.3 4.5 6.9 7.5  HGB 11.2* 10.8* 10.4* 10.1* 11.7*  HCT 35.6* 33.7* 32.3* 31.5* 36.9  MCV 90.8 91.6 90.2 90.3 90.4  PLT 286 247 269 289 300   Cardiac Enzymes: No results for input(s): CKTOTAL, CKMB, CKMBINDEX, TROPONINI in the last 168 hours. BNP: Invalid input(s): POCBNP CBG: Recent Labs  Lab 03/17/18 1643  GLUCAP 92   D-Dimer No results for input(s): DDIMER in the last 72 hours. Hgb A1c No results for input(s): HGBA1C in the last 72  hours. Lipid Profile No results for input(s): CHOL, HDL, LDLCALC, TRIG, CHOLHDL, LDLDIRECT in the last 72 hours. Thyroid function studies No results for input(s): TSH, T4TOTAL, T3FREE, THYROIDAB in the last 72 hours.  Invalid input(s): FREET3 Anemia work up No results for input(s): VITAMINB12, FOLATE, FERRITIN, TIBC, IRON, RETICCTPCT in the last 72 hours. Urinalysis    Component Value Date/Time   COLORURINE YELLOW 03/15/2018 1254   APPEARANCEUR CLEAR 03/15/2018 1254   LABSPEC 1.010 03/15/2018 1254   PHURINE 6.0 03/15/2018 1254   GLUCOSEU NEGATIVE 03/15/2018 1254   HGBUR SMALL (A) 03/15/2018 1254   BILIRUBINUR NEGATIVE 03/15/2018 1254   KETONESUR NEGATIVE 03/15/2018 1254   PROTEINUR NEGATIVE 03/15/2018 1254   NITRITE NEGATIVE 03/15/2018 1254   LEUKOCYTESUR NEGATIVE 03/15/2018 1254   Sepsis Labs Invalid input(s): PROCALCITONIN,  WBC,  LACTICIDVEN Microbiology No results found for this or any previous visit (from the past 240 hour(s)).   Time coordinating discharge: 32 minutes  SIGNED:  Chipper Oman, MD  [facility] 03/22/2018, 12:15 PM  Pager please text page via  [email or web address]  Note - This record has been created using [employer]. Chart creation errors have been sought, but may not always have been located. Such creation errors do not reflect on the standard of medical care.

## 2018-03-23 ENCOUNTER — Telehealth: Payer: Self-pay | Admitting: Radiation Therapy

## 2018-03-23 ENCOUNTER — Ambulatory Visit: Payer: Medicare Other

## 2018-03-23 ENCOUNTER — Other Ambulatory Visit: Payer: Self-pay | Admitting: Radiation Oncology

## 2018-03-23 ENCOUNTER — Telehealth: Payer: Self-pay

## 2018-03-23 MED ORDER — HYDROMORPHONE HCL 4 MG PO TABS: 4.0000 mg | ORAL_TABLET | ORAL | 0 refills | Status: DC | PRN

## 2018-03-23 MED ORDER — HYDROMORPHONE HCL 4 MG PO TABS: 4.0000 mg | ORAL_TABLET | ORAL | 0 refills | Status: AC | PRN

## 2018-03-23 NOTE — Telephone Encounter (Signed)
Palliative Medicine RN Note: Rec'd call from pt requesting refill of hydromorphone PO. She reports that she left her facility at 10:30 last night because "it wasn't safe for me there" and "they wasn't giving me my pain meds." I explained that, unfortunately, not only is Wadie Lessen out until next week but that she doesn't write any prescriptions. She asked if some of her other MDs would give her a prescription, and I told her that because they aren't on our practice, she would have to call them.   Marjie Skiff Orian Amberg, RN, BSN, [facility] Palliative Medicine Team 03/23/2018 9:24 AM Office [phone]

## 2018-03-23 NOTE — Telephone Encounter (Signed)
Returned a call to [place] this morning. She was discharged from the hospital 9/19 to a SNF, but did not like it there and asked for her brother to come and get her that same day. Now she is back at home and without pain control requesting an RX for dilaudid stating that," that did a good job while I was in the hospital."   Dr. Lisbeth Renshaw will give her a short course of Dilaudid as a bridge until she can get in with a pain specialist. A referral has been placed to Dr. Maryjean Ka at [facility]. Jill [name] has completed the radiation we had planned for her at this time. She has had kyphoplasty at T7 and her spine was included in her previous chest XRT fields. If there is continued pain in her back caused by the compression deformities at T6, T10, and T11, Dr. Lisbeth Renshaw suggested that she be seen by interventional radiology again for consideration of more kyphoplasty.  I also spoke with Jill [name] about the recommendation that she enroll in Hospice care. I asked if she would like Jill [name] to call again to get this set up for her, she said yes, but not today. I asked if she plans to go to her appointment with Dr. Julien Nordmann on 10/3, she said yes, but I will need a reminder.   At this time Jill [name]'s first priority is getting the dilaudid. She said that the pain is significant in her leg, and that her back hurts but not as bad as before. Most of her participation in our conversation was centered around the medication and when it will be available for her brother to pick up.      Mont Dutton R.T.(R)(T) Special Procedures Navigator  [phone]

## 2018-03-24 ENCOUNTER — Emergency Department (HOSPITAL_COMMUNITY): Payer: Medicare Other

## 2018-03-24 ENCOUNTER — Encounter (HOSPITAL_COMMUNITY): Payer: Self-pay | Admitting: Emergency Medicine

## 2018-03-24 ENCOUNTER — Observation Stay (HOSPITAL_COMMUNITY)
Admission: EM | Admit: 2018-03-24 | Discharge: 2018-03-25 | Discharge status: Against Medical Advice | Payer: Medicare Other | Attending: Internal Medicine | Admitting: Internal Medicine

## 2018-03-24 ENCOUNTER — Other Ambulatory Visit: Payer: Self-pay

## 2018-03-24 DIAGNOSIS — Z886 Allergy status to analgesic agent status: Secondary | ICD-10-CM | POA: Insufficient documentation

## 2018-03-24 DIAGNOSIS — D649 Anemia, unspecified: Secondary | ICD-10-CM | POA: Insufficient documentation

## 2018-03-24 DIAGNOSIS — L732 Hidradenitis suppurativa: Secondary | ICD-10-CM | POA: Insufficient documentation

## 2018-03-24 DIAGNOSIS — C7931 Secondary malignant neoplasm of brain: Secondary | ICD-10-CM | POA: Insufficient documentation

## 2018-03-24 DIAGNOSIS — Z6828 Body mass index (BMI) 28.0-28.9, adult: Secondary | ICD-10-CM | POA: Insufficient documentation

## 2018-03-24 DIAGNOSIS — Z888 Allergy status to other drugs, medicaments and biological substances status: Secondary | ICD-10-CM | POA: Insufficient documentation

## 2018-03-24 DIAGNOSIS — G893 Neoplasm related pain (acute) (chronic): Secondary | ICD-10-CM | POA: Diagnosis not present

## 2018-03-24 DIAGNOSIS — J189 Pneumonia, unspecified organism: Secondary | ICD-10-CM | POA: Diagnosis not present

## 2018-03-24 DIAGNOSIS — C7951 Secondary malignant neoplasm of bone: Secondary | ICD-10-CM | POA: Diagnosis not present

## 2018-03-24 DIAGNOSIS — Z79899 Other long term (current) drug therapy: Secondary | ICD-10-CM | POA: Diagnosis not present

## 2018-03-24 DIAGNOSIS — F209 Schizophrenia, unspecified: Secondary | ICD-10-CM | POA: Diagnosis not present

## 2018-03-24 DIAGNOSIS — E669 Obesity, unspecified: Secondary | ICD-10-CM | POA: Insufficient documentation

## 2018-03-24 DIAGNOSIS — R52 Pain, unspecified: Secondary | ICD-10-CM | POA: Diagnosis present

## 2018-03-24 DIAGNOSIS — J9611 Chronic respiratory failure with hypoxia: Secondary | ICD-10-CM | POA: Insufficient documentation

## 2018-03-24 DIAGNOSIS — Z923 Personal history of irradiation: Secondary | ICD-10-CM | POA: Insufficient documentation

## 2018-03-24 DIAGNOSIS — C7800 Secondary malignant neoplasm of unspecified lung: Secondary | ICD-10-CM | POA: Insufficient documentation

## 2018-03-24 DIAGNOSIS — F17210 Nicotine dependence, cigarettes, uncomplicated: Secondary | ICD-10-CM | POA: Insufficient documentation

## 2018-03-24 DIAGNOSIS — I712 Thoracic aortic aneurysm, without rupture: Secondary | ICD-10-CM | POA: Insufficient documentation

## 2018-03-24 DIAGNOSIS — G43709 Chronic migraine without aura, not intractable, without status migrainosus: Secondary | ICD-10-CM | POA: Diagnosis not present

## 2018-03-24 DIAGNOSIS — I700 Atherosclerosis of aorta: Secondary | ICD-10-CM | POA: Diagnosis not present

## 2018-03-24 DIAGNOSIS — I10 Essential (primary) hypertension: Secondary | ICD-10-CM | POA: Insufficient documentation

## 2018-03-24 DIAGNOSIS — M79604 Pain in right leg: Secondary | ICD-10-CM

## 2018-03-24 DIAGNOSIS — E876 Hypokalemia: Secondary | ICD-10-CM | POA: Diagnosis not present

## 2018-03-24 DIAGNOSIS — C3431 Malignant neoplasm of lower lobe, right bronchus or lung: Secondary | ICD-10-CM | POA: Diagnosis not present

## 2018-03-24 DIAGNOSIS — Z885 Allergy status to narcotic agent status: Secondary | ICD-10-CM | POA: Insufficient documentation

## 2018-03-24 DIAGNOSIS — J188 Other pneumonia, unspecified organism: Secondary | ICD-10-CM | POA: Insufficient documentation

## 2018-03-24 DIAGNOSIS — R0602 Shortness of breath: Secondary | ICD-10-CM

## 2018-03-24 DIAGNOSIS — J9621 Acute and chronic respiratory failure with hypoxia: Secondary | ICD-10-CM

## 2018-03-24 DIAGNOSIS — R000 Tachycardia, unspecified: Secondary | ICD-10-CM | POA: Insufficient documentation

## 2018-03-24 LAB — CBC WITH DIFFERENTIAL/PLATELET
BASOS ABS: 0 10*3/uL (ref 0.0–0.1)
BASOS PCT: 0 %
EOS ABS: 0.2 10*3/uL (ref 0.0–0.7)
EOS PCT: 2 %
HCT: 39.9 % (ref 36.0–46.0)
HEMOGLOBIN: 12.9 g/dL (ref 12.0–15.0)
Lymphocytes Relative: 6 %
Lymphs Abs: 0.7 10*3/uL (ref 0.7–4.0)
MCH: 28.9 pg (ref 26.0–34.0)
MCHC: 32.3 g/dL (ref 30.0–36.0)
MCV: 89.5 fL (ref 78.0–100.0)
Monocytes Absolute: 0.6 10*3/uL (ref 0.1–1.0)
Monocytes Relative: 6 %
NEUTROS PCT: 86 %
Neutro Abs: 8.9 10*3/uL — ABNORMAL HIGH (ref 1.7–7.7)
PLATELETS: 264 10*3/uL (ref 150–400)
RBC: 4.46 MIL/uL (ref 3.87–5.11)
RDW: 16.8 % — ABNORMAL HIGH (ref 11.5–15.5)
WBC: 10.3 10*3/uL (ref 4.0–10.5)

## 2018-03-24 LAB — COMPREHENSIVE METABOLIC PANEL
ALT: 63 U/L — ABNORMAL HIGH (ref 0–44)
ANION GAP: 11 (ref 5–15)
AST: 16 U/L (ref 15–41)
Albumin: 2.9 g/dL — ABNORMAL LOW (ref 3.5–5.0)
Alkaline Phosphatase: 122 U/L (ref 38–126)
BILIRUBIN TOTAL: 1 mg/dL (ref 0.3–1.2)
BUN: 13 mg/dL (ref 6–20)
CALCIUM: 9.5 mg/dL (ref 8.9–10.3)
CO2: 27 mmol/L (ref 22–32)
CREATININE: 0.48 mg/dL (ref 0.44–1.00)
Chloride: 104 mmol/L (ref 98–111)
Glucose, Bld: 104 mg/dL — ABNORMAL HIGH (ref 70–99)
Potassium: 3.4 mmol/L — ABNORMAL LOW (ref 3.5–5.1)
Sodium: 142 mmol/L (ref 135–145)
TOTAL PROTEIN: 6.2 g/dL — AB (ref 6.5–8.1)

## 2018-03-24 LAB — URINALYSIS, ROUTINE W REFLEX MICROSCOPIC
BACTERIA UA: NONE SEEN
Bilirubin Urine: NEGATIVE
Glucose, UA: NEGATIVE mg/dL
Ketones, ur: 5 mg/dL — AB
Nitrite: NEGATIVE
PROTEIN: NEGATIVE mg/dL
RBC / HPF: >50 RBC/hpf — ABNORMAL HIGH (ref 0–5)
Specific Gravity, Urine: 1.025 (ref 1.005–1.030)
pH: 6 (ref 5.0–8.0)

## 2018-03-24 LAB — I-STAT CG4 LACTIC ACID, ED: LACTIC ACID, VENOUS: 1.04 mmol/L (ref 0.5–1.9)

## 2018-03-24 LAB — POTASSIUM: POTASSIUM: 4.2 mmol/L (ref 3.5–5.1)

## 2018-03-24 LAB — TROPONIN I: Troponin I: <0.03 ng/mL (ref <0.03)

## 2018-03-24 LAB — I-STAT BETA HCG BLOOD, ED (MC, WL, AP ONLY)

## 2018-03-24 LAB — BRAIN NATRIURETIC PEPTIDE: B Natriuretic Peptide: 67.5 pg/mL (ref 0.0–100.0)

## 2018-03-24 LAB — PROTIME-INR
INR: 1.12
Prothrombin Time: 14.3 seconds (ref 11.4–15.2)

## 2018-03-24 MED ORDER — HALOPERIDOL 5 MG PO TABS
5.0000 mg | ORAL_TABLET | Freq: Two times a day (BID) | ORAL | Status: DC
  Administered 2018-03-24: 5 mg via ORAL
  Filled 2018-03-24 (×2): qty 1

## 2018-03-24 MED ORDER — HYDROMORPHONE HCL 1 MG/ML IJ SOLN
1.0000 mg | INTRAMUSCULAR | Status: DC | PRN
  Administered 2018-03-24 – 2018-03-25 (×2): 1 mg via INTRAVENOUS
  Filled 2018-03-24 (×2): qty 1

## 2018-03-24 MED ORDER — FENTANYL 100 MCG/HR TD PT72: 100.0000 ug | MEDICATED_PATCH | TRANSDERMAL | Status: DC

## 2018-03-24 MED ORDER — LEVOFLOXACIN 750 MG PO TABS: 750.0000 mg | ORAL_TABLET | Freq: Every day | ORAL | Status: DC

## 2018-03-24 MED ORDER — POLYETHYLENE GLYCOL 3350 17 G PO PACK
17.0000 g | PACK | Freq: Two times a day (BID) | ORAL | Status: DC
  Administered 2018-03-24: 17 g via ORAL
  Filled 2018-03-24 (×2): qty 1

## 2018-03-24 MED ORDER — NICOTINE 21 MG/24HR TD PT24
21.0000 mg | MEDICATED_PATCH | Freq: Every day | TRANSDERMAL | Status: DC
  Filled 2018-03-24: qty 1

## 2018-03-24 MED ORDER — ACETAMINOPHEN 500 MG PO TABS
1000.0000 mg | ORAL_TABLET | Freq: Four times a day (QID) | ORAL | Status: DC | PRN
  Administered 2018-03-24: 1000 mg via ORAL
  Filled 2018-03-24: qty 2

## 2018-03-24 MED ORDER — HYDROMORPHONE HCL 1 MG/ML IJ SOLN
1.0000 mg | Freq: Once | INTRAMUSCULAR | Status: AC
  Administered 2018-03-24: 1 mg via INTRAVENOUS
  Filled 2018-03-24: qty 1

## 2018-03-24 MED ORDER — METOPROLOL TARTRATE 5 MG/5ML IV SOLN: 5.0000 mg | INTRAVENOUS | Status: DC | PRN

## 2018-03-24 MED ORDER — SENNOSIDES-DOCUSATE SODIUM 8.6-50 MG PO TABS
1.0000 | ORAL_TABLET | Freq: Two times a day (BID) | ORAL | Status: DC
  Administered 2018-03-24: 1 via ORAL
  Filled 2018-03-24 (×2): qty 1

## 2018-03-24 MED ORDER — POTASSIUM CHLORIDE CRYS ER 20 MEQ PO TBCR
40.0000 meq | EXTENDED_RELEASE_TABLET | Freq: Once | ORAL | Status: AC
  Administered 2018-03-24: 40 meq via ORAL
  Filled 2018-03-24: qty 2

## 2018-03-24 MED ORDER — IPRATROPIUM-ALBUTEROL 0.5-2.5 (3) MG/3ML IN SOLN: 3.0000 mL | RESPIRATORY_TRACT | Status: DC | PRN

## 2018-03-24 MED ORDER — ORAL CARE MOUTH RINSE
15.0000 mL | Freq: Two times a day (BID) | OROMUCOSAL | Status: DC
  Administered 2018-03-24: 15 mL via OROMUCOSAL

## 2018-03-24 MED ORDER — ENOXAPARIN SODIUM 40 MG/0.4ML ~~LOC~~ SOLN
40.0000 mg | SUBCUTANEOUS | Status: DC
  Administered 2018-03-24: 40 mg via SUBCUTANEOUS
  Filled 2018-03-24: qty 0.4

## 2018-03-24 MED ORDER — METOPROLOL TARTRATE 25 MG PO TABS
25.0000 mg | ORAL_TABLET | Freq: Two times a day (BID) | ORAL | Status: DC
  Administered 2018-03-24: 25 mg via ORAL
  Filled 2018-03-24 (×2): qty 1

## 2018-03-24 MED ORDER — SODIUM CHLORIDE 0.9% FLUSH: 10.0000 mL | INTRAVENOUS | Status: DC | PRN

## 2018-03-24 MED ORDER — ROSUVASTATIN CALCIUM 20 MG PO TABS
20.0000 mg | ORAL_TABLET | Freq: Every evening | ORAL | Status: DC
  Administered 2018-03-24: 20 mg via ORAL
  Filled 2018-03-24: qty 2
  Filled 2018-03-24: qty 1

## 2018-03-24 MED ORDER — INFLUENZA VAC SPLIT QUAD 0.5 ML IM SUSY: 0.5000 mL | PREFILLED_SYRINGE | INTRAMUSCULAR | Status: DC

## 2018-03-24 MED ORDER — ENSURE ENLIVE PO LIQD: 237.0000 mL | Freq: Two times a day (BID) | ORAL | Status: DC

## 2018-03-24 NOTE — ED Notes (Signed)
Bed: YT46 Expected date: 03/24/18 Expected time: 11:32 AM Means of arrival: Ambulance Comments: Ca pt, leg pain due to bone cancer

## 2018-03-24 NOTE — Progress Notes (Signed)
SBAR report given to Vanita Ingles, Therapist, sports.

## 2018-03-24 NOTE — H&P (Signed)
History and Physical    Jill [name] FBP:[id number] DOB: 09/09/1960 DOA: 03/24/2018  I have briefly reviewed the patient's prior medical records in [place]  PCP: Aletha Halim., PA-C  Patient coming from: home  Chief Complaint: right hip pain  HPI: Jill [name] is a 57 y.o. female with medical history significant of stage IV lung cancer currently on Keytruda followed by Dr. Earlie Server, tobacco abuse, hypertension, schizophrenia, with several recent admissions to the hospital most recently discharged 2 days ago with postobstructive pneumonia presents to the hospital with intractable right hip pain and inability to bear any weight on the right side.  Patient was discharged 2 days ago to an SNF, however on arrival there she had a "bad experience", and called her brother who came and picked her up and got her home.  In the last couple of days, she states that her right hip has been excruciating and she is unable to bear any weight on the right side.  Her home oral regimen is not controlling the pain.  She is also been experiencing slight worsening of shortness of breath as well as had an episode of chest pain earlier today.  She denies any fever or chills.  She denies any abdominal pain, nausea or vomiting, she has no diarrhea and is in fact constipated.  ED Course: In the emergency room vital signs are pertinent for sinus tachycardia which is chronic for her, blood work is relatively unremarkable.  Chest x-ray without significant changes from prior findings on lobar pneumonia on the right lower lobe, and femur x-ray did not show any fractures or acute findings.  Oncology was consulted, will evaluate patient and recommended admission for pain control  Review of Systems: As per HPI otherwise 10 point review of systems negative.   Past Medical History:  Diagnosis Date  . Cancer ([place])   . Hypertension   . Schizophrenia [facility])     Past Surgical History:  Procedure Laterality Date  .  HERNIA REPAIR    . IR BONE TUMOR(S)RF ABLATION  01/30/2018  . IR KYPHO THORACIC WITH BONE BIOPSY  01/30/2018  . IR RADIOLOGIST EVAL & MGMT  01/23/2018  . KIDNEY SURGERY    . RADIOLOGY WITH ANESTHESIA N/A 01/12/2018   Procedure: MRI WITH ANESTHESIA;  Surgeon: Radiologist, Medication, MD;  Location: [facility];  Service: Radiology;  Laterality: N/A;  . VIDEO BRONCHOSCOPY Bilateral 11/14/2017   Procedure: VIDEO BRONCHOSCOPY WITH FLUORO;  Surgeon: Chesley Mires, MD;  Location: [facility];  Service: Endoscopy;  Laterality: Bilateral;     reports that she quit smoking about 3 months ago. Her smoking use included cigarettes. She started smoking about 36 years ago. She has a 72.00 pack-year smoking history. She has never used smokeless tobacco. She reports that she does not drink alcohol or use drugs.  Allergies  Allergen Reactions  . Ibuprofen Nausea And Vomiting  . Morphine And Related     Headaches, Vomiting  . Prednisone     Dizziness and pain    Family History  Problem Relation Age of Onset  . Cancer Mother     Prior to Admission medications   Medication Sig Start Date End Date Taking? Authorizing Provider  acetaminophen (TYLENOL) 500 MG tablet Take 1,000 mg by mouth every 6 (six) hours as needed for moderate pain.    Yes [provider]  fentaNYL (DURAGESIC - DOSED MCG/HR) 100 MCG/HR Place 1 patch (100 mcg total) onto the skin every 3 (three) days. 03/22/18  Yes Patrecia Pour, Christean Grief, MD  haloperidol (HALDOL) 5 MG tablet Take 1 tablet (5 mg total) by mouth 2 (two) times daily. 03/22/18  Yes Patrecia Pour, Christean Grief, MD  HYDROmorphone (DILAUDID) 4 MG tablet Take 1 tablet (4 mg total) by mouth every 4 (four) hours as needed for moderate pain or severe pain. 03/23/18  Yes Kyung Rudd, MD  ipratropium-albuterol (DUONEB) 0.5-2.5 (3) MG/3ML SOLN Take 3 mLs by nebulization every 4 (four) hours as needed. 11/18/17  Yes Kayleen Memos, DO  levofloxacin (LEVAQUIN) 750 MG tablet Take 1 tablet (750 mg  total) by mouth at bedtime for 2 days. 03/22/18 03/24/18 Yes Doreatha Lew, MD  nicotine (NICODERM CQ - DOSED IN MG/24 HOURS) 21 mg/24hr patch Place 21 mg onto the skin daily.   Yes [provider]  polyethylene glycol (MIRALAX / GLYCOLAX) packet [place] 1 PACKET TWICE DAILY Patient taking differently: Take 17 g by mouth daily as needed for mild constipation.  12/08/17  Yes Kyung Rudd, MD  predniSONE (DELTASONE) 10 MG tablet Take 4 tablets for 3 days; Take 3 tablets for 4 days; Take 2 tablets for 3 days; Take 1 tablet for 4 days 03/22/18  Yes Patrecia Pour, Christean Grief, MD  rosuvastatin (CRESTOR) 20 MG tablet Take 20 mg by mouth every evening.    Yes [provider]  senna-docusate (SENOKOT-S) 8.6-50 MG tablet Take 1 tablet by mouth 2 (two) times daily. 11/18/17  Yes Kayleen Memos, DO  hydrOXYzine (ATARAX/VISTARIL) 25 MG tablet Take 1 tablet (25 mg total) by mouth 3 (three) times daily. 03/22/18   Doreatha Lew, MD  metoprolol tartrate (LOPRESSOR) 25 MG tablet Take 1 tablet (25 mg total) by mouth 2 (two) times daily. 03/22/18   Doreatha Lew, MD    Physical Exam: Vitals:   03/24/18 1209 03/24/18 1221 03/24/18 1224 03/24/18 1230  BP:  115/79  105/75  Pulse:  (!) 126  (!) 127  Resp:  (!) 26  (!) 26  Temp:  98.1 F (36.7 C)    TempSrc:  Oral    SpO2: 94% 93%  90%  Weight:   67.5 kg   Height:   5\' 1"  (1.549 m)       Constitutional: NAD, calm, comfortable Eyes: PERRL, lids and conjunctivae normal ENMT: Mucous membranes are moist.  Neck: normal, supple, no masses, no thyromegaly Respiratory: clear to auscultation bilaterally, no wheezing, no crackles. Normal respiratory effort. No accessory muscle use.  Overall decreased breath sounds Cardiovascular: Regular rate and rhythm, no murmurs / rubs / gallops. No extremity edema. 2+ pedal pulses.  Tachycardic Abdomen: no tenderness, no masses palpated. Bowel sounds positive.  Musculoskeletal: no clubbing / cyanosis.  Normal muscle tone.  Skin: no rashes, lesions, ulcers. No induration Neurologic: CN 2-12 grossly intact. Strength 5/5 in all 4.  Psychiatric: Normal judgment and insight. Alert and oriented x 3. Normal mood.   Labs on Admission: I have personally reviewed following labs and imaging studies  CBC: Recent Labs  Lab 03/19/18 0557 03/20/18 0310 03/21/18 0340 03/22/18 0450 03/24/18 1239  WBC 7.3 4.7 7.5 9.4 10.3  NEUTROABS 6.3 4.5 6.9 7.5 8.9*  HGB 10.8* 10.4* 10.1* 11.7* 12.9  HCT 33.7* 32.3* 31.5* 36.9 39.9  MCV 91.6 90.2 90.3 90.4 89.5  PLT 247 269 289 300 161   Basic Metabolic Panel: Recent Labs  Lab 03/18/18 0506 03/19/18 0557 03/20/18 0310 03/20/18 1847 03/21/18 0340 03/22/18 0450 03/24/18 1239  NA 141 134* 138 137  --  142 142  K 3.6 4.0 3.9 3.7  --  3.3* 3.4*  CL 106 99 101 100  --  105 104  CO2 28 26 29 28   --  27 27  GLUCOSE 104* 103* 146* 182*  --  79 104*  BUN 5* 5* 6 9  --  10 13  CREATININE 0.31* <0.30* 0.30* 0.40*  --  0.35* 0.48  CALCIUM 9.4 9.1 9.5 9.4  --  9.9 9.5  MG 1.9 1.7 1.8  --  1.9 1.9  --   PHOS 3.3 3.3 3.1  --  3.5  --   --    GFR: Estimated Creatinine Clearance: 69 mL/min (by C-G formula based on SCr of 0.48 mg/dL). Liver Function Tests: Recent Labs  Lab 03/18/18 0506 03/19/18 0557 03/20/18 0310 03/20/18 1847 03/24/18 1239  AST 20 21 59* 99* 16  ALT 19 20 48* 93* 63*  ALKPHOS 140* 120 132* 123 122  BILITOT 0.6 0.7 0.6 0.3 1.0  PROT 5.6* 5.6* 5.9* 5.6* 6.2*  ALBUMIN 2.6* 2.3* 2.4* 2.3* 2.9*   No results for input(s): LIPASE, AMYLASE in the last 168 hours. No results for input(s): AMMONIA in the last 168 hours. Coagulation Profile: Recent Labs  Lab 03/24/18 1239  INR 1.12   Cardiac Enzymes: No results for input(s): CKTOTAL, CKMB, CKMBINDEX, TROPONINI in the last 168 hours. BNP (last 3 results) No results for input(s): PROBNP in the last 8760 hours. HbA1C: No results for input(s): HGBA1C in the last 72 hours. CBG: Recent  Labs  Lab 03/17/18 1643  GLUCAP 92   Lipid Profile: No results for input(s): CHOL, HDL, LDLCALC, TRIG, CHOLHDL, LDLDIRECT in the last 72 hours. Thyroid Function Tests: No results for input(s): TSH, T4TOTAL, FREET4, T3FREE, THYROIDAB in the last 72 hours. Anemia Panel: No results for input(s): VITAMINB12, FOLATE, FERRITIN, TIBC, IRON, RETICCTPCT in the last 72 hours. Urine analysis:    Component Value Date/Time   COLORURINE YELLOW 03/15/2018 1254   APPEARANCEUR CLEAR 03/15/2018 1254   LABSPEC 1.010 03/15/2018 1254   PHURINE 6.0 03/15/2018 1254   GLUCOSEU NEGATIVE 03/15/2018 1254   HGBUR SMALL (A) 03/15/2018 1254   BILIRUBINUR NEGATIVE 03/15/2018 1254   KETONESUR NEGATIVE 03/15/2018 1254   PROTEINUR NEGATIVE 03/15/2018 1254   NITRITE NEGATIVE 03/15/2018 1254   LEUKOCYTESUR NEGATIVE 03/15/2018 1254     Radiological Exams on Admission: Dg Chest 2 View  Result Date: 03/24/2018 CLINICAL DATA:  Shortness of breath for a few days. History of hypertension. EXAM: CHEST - 2 VIEW COMPARISON:  03/21/2018 and multiple prior studies. FINDINGS: Cardiac silhouette normal in size. No mediastinal masses. Mediastinal adenopathy noted on a recent CT, 03/18/2018, not resolved radiographically. Thickened bronchovascular markings bilaterally. There is hazy ground-glass opacity in the right lung with more confluent opacity at the right lung base, similar to the appearance on the prior chest radiograph and on the prior CT. No new lung abnormalities. No convincing pleural effusion.  No pneumothorax. Right PICC tip projects in the right atrium. IMPRESSION: 1. No significant change from the most recent prior exam allowing for differences in technique and patient positioning. 2. Persistent consolidation in the right lower lobe, which may be pneumonia, postobstructive or a combination of both. Electronically Signed   By: Lajean Manes M.D.   On: 03/24/2018 13:30   Dg Femur Min 2 Views Right  Result Date:  03/24/2018 CLINICAL DATA:  Right thigh pain for 2-3 days. EXAM: RIGHT FEMUR 2 VIEWS COMPARISON:  None. FINDINGS: No fracture.  No  bone lesion. Hip joint normally spaced and aligned. Knee joint is normally aligned. Mild narrowing of the knee joint space. No convincing joint effusion. Soft tissues are unremarkable. IMPRESSION: 1. No fracture or acute finding. No bone lesion. No soft tissue abnormality. Electronically Signed   By: Lajean Manes M.D.   On: 03/24/2018 13:32    EKG: Independently reviewed.  Telemetry shows sinus tachycardia  Assessment/Plan Active Problems:   Cancer associated pain    Stage IV lung cancer/cancer associated intractable pain -Possible bone metastasis on the right however x-ray not completely clear.  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 later.  She is on 4 mg of p.o. Dilaudid at home.  Palliative care was consulted last time she was hospitalized to assist in pain control, if the above-mentioned measures will not work may benefit from repeat consultation -Oncology will see as well, EDPd/w Dr Lindi Adie  Recent healthcare associated pneumonia -Patient was placed on Levaquin on discharge, has 2 days left and will continue here.  She has no leukocytosis and is not febrile.  Chest x-ray looks unchanged  Sinus tachycardia -This is a known problem for the patient, she tells me her heart has been beating a little bit faster since April of this year, suspect in the setting of metastatic cancer, now slightly worsening due to uncontrolled pain -Continue home metoprolol  Chronic respiratory failure with hypoxia -She appears to be satting 90% on home 3 L nasal cannula.  Patient with multiple compression fractures in the T-spine likely contributing to poor inspiratory  effort, in addition she is still smoking  Hypokalemia -We will replete  Tobacco abuse -Continue nicotine patch  Normocytic anemia -Of chronic illness, no bleeding   DVT prophylaxis: Lovenox  Code Status: Full code  Family Communication: no family at bedside Disposition Plan: admit to medsurg, likely needs SNF Consults called: Oncology     Admission status: observation   At the point of initial evaluation, it is my clinical opinion that admission for OBSERVATION is reasonable and necessary because the patient's presenting complaints in the context of their chronic conditions represent sufficient risk of deterioration or significant morbidity to constitute reasonable grounds for close observation in the hospital setting, but that the patient may be medically stable for discharge from the hospital within 24 to 48 hours.    Marzetta Board, MD [facility] Pager [phone]  If 7PM-7AM, please contact night-coverage [email or web address] Password [facility]  03/24/2018, 2:33 PM

## 2018-03-24 NOTE — Progress Notes (Signed)
  Vital Signs MEWS/VS Documentation      03/24/2018 1718 03/24/2018 1815 03/24/2018 1836 03/24/2018 1852   MEWS Score:  3  4  4  4    MEWS Score Color:  Yellow  Red  Red  Red   Resp:  (!) 24  (!) 28  (!) 26  (!) 23   Pulse:  (!) 120  (!) 126  (!) 128  (!) 136   BP:  -  117/78  117/60  124/70   Temp:  -  97.9 F (36.6 C)  98.1 F (36.7 C)  97.8 F (36.6 C)   O2 Device:  -  Nasal Cannula  Nasal Cannula  Nasal Cannula   O2 Flow Rate (L/min):  -  3 L/min  3 L/min  3 L/min     HR 172 non sustained. Patient lying on her left side stating she does not "feel well".  MD notified.  Per MD give 10 pm dose of Metoprolol and he will have attending to come and see patient.      Murray Hodgkins [place] 03/24/2018,7:02 PM

## 2018-03-24 NOTE — Progress Notes (Signed)
10 PM dose of metoprolol given per MD order.

## 2018-03-24 NOTE — Progress Notes (Signed)
Called to the bedside for a MEWS score of 4. On arrival, pt is laying in bed looking for her phone. She denies any chest pain/discomfort, N/V, dizziness, increased SOB. Pt is tachycardiac with HR in 140's. Given 2200 dose of metoprolol early. Pt does not appear to be in immediate distress. Will continue to assess and monitor.   Tachycardia - Given metoprolol po dose early - will give 1x dose of IV metoprolol in 30-45 min if no improvement in HR.   MEWS - Pt normotensive, afebrile and asymptomatic. Tachycardia is a known problem for the patient. No further interventions at this time.    Lovey Newcomer, NP [facility] 7p-7a [phone]

## 2018-03-24 NOTE — Progress Notes (Signed)
  Vital Signs MEWS/VS Documentation      03/24/2018 1700 03/24/2018 1718 03/24/2018 1815 03/24/2018 1836   MEWS Score:  3  3  4  4    MEWS Score Color:  Yellow  Yellow  Red  Red   Resp:  -  (!) 24  (!) 28  (!) 26   Pulse:  (!) 117  (!) 120  (!) 126  (!) 128   BP:  104/68  -  117/78  117/60   Temp:  -  -  97.9 F (36.6 C)  98.1 F (36.7 C)   O2 Device:  -  -  Nasal Cannula  Nasal Cannula   O2 Flow Rate (L/min):  -  -  3 L/min  3 L/min      Wells Guiles A [place] 03/24/2018,6:47 PM  18:23 MEWS =4 RRN, MD, and CN notified.  Patient quite, eating, and does not seem in distress at this time. MD stated he is not coming to the bedside that this is the patient's norm and is well documented in the chart. RRN stated she would check patient via patient's chart.  CN & AD notified.

## 2018-03-24 NOTE — ED Triage Notes (Signed)
Patient BIB [facility] from home for Rt thigh pain. Pt has hx of terminal bone CA. Pt was DC'd from here on 19th to a facility but felt unsafe and had family take her home same day. Pt c/o new onset of immobility and increased pain in rt leg. Pt took dilaudid before leaving home with EMS. Pt also wearing fentanyl patch and nicotine patch. Pt lives alone brother is just down the rd.

## 2018-03-24 NOTE — ED Provider Notes (Signed)
[facility] DEPT Provider Note   CSN: [id number] Arrival date & time: 03/24/18  1155     History   Chief Complaint Chief Complaint  Patient presents with  . R Leg Pain  . CA Patient    HPI Jill [name] is a 57 y.o. female with history of hypertension, schizophrenia, metastatic lung cancer with brain metastasis and bone metastasis who presents with new right upper leg pain.  Patient reports she has been treated with radiation to her left leg, however has not had right leg pain before.  She is unable to bear weight.  She has been taking Dilaudid at home with out significant relief.  She was recently discharged from the hospital to a SNF and the patient left the same day because she felt that she was unsafe there.  She had her brother picked her up.  Patient also reports a little bit increased shortness of breath at home.  She is on 3 L of oxygen as needed at home.  She denies any new chest pain, abdominal pain, nausea, vomiting, urinary symptoms.  Patient reports she has had some tingling to her second through fifth toes on her right foot for the past several months.  HPI  Past Medical History:  Diagnosis Date  . Cancer ([place])   . Hypertension   . Schizophrenia [facility])     Patient Active Problem List   Diagnosis Date Noted  . Cancer associated pain 03/24/2018  . Generalized weakness 03/15/2018  . Cancer related pain   . Palliative care by specialist   . Metastatic lung carcinoma, right ([place])   . Bone metastasis ([place]) 01/13/2018  . HCAP (healthcare-associated pneumonia) 01/07/2018  . Tachycardia 01/07/2018  . Encounter for antineoplastic immunotherapy 12/12/2017  . DNR (do not resuscitate) discussion 12/12/2017  . Primary malignant neoplasm of bronchus of right lower lobe ([place]) 11/17/2017  . Brain metastases ([place]) 11/14/2017  . Sepsis due to pneumonia ([place]) 11/13/2017  . Hemoptysis 11/13/2017  . Hypoxia 11/13/2017  . Metastatic lung cancer  (metastasis from lung to other site), right ([place]) 11/13/2017  . Chronic migraine without aura 11/13/2017  . OBESITY, NOS 08/31/2006  . Schizophrenia ([place]) 08/31/2006  . TOBACCO DEPENDENCE 08/31/2006  . HYPERTENSION, BENIGN SYSTEMIC 08/31/2006  . MENOPAUSAL SYNDROME 08/31/2006  . HYDRADENITIS 08/31/2006    Past Surgical History:  Procedure Laterality Date  . HERNIA REPAIR    . IR BONE TUMOR(S)RF ABLATION  01/30/2018  . IR KYPHO THORACIC WITH BONE BIOPSY  01/30/2018  . IR RADIOLOGIST EVAL & MGMT  01/23/2018  . KIDNEY SURGERY    . RADIOLOGY WITH ANESTHESIA N/A 01/12/2018   Procedure: MRI WITH ANESTHESIA;  Surgeon: Radiologist, Medication, MD;  Location: [facility];  Service: Radiology;  Laterality: N/A;  . VIDEO BRONCHOSCOPY Bilateral 11/14/2017   Procedure: VIDEO BRONCHOSCOPY WITH FLUORO;  Surgeon: Chesley Mires, MD;  Location: [facility] END[facility];  Service: Endoscopy;  Laterality: Bilateral;     OB History   None      Home Medications    Prior to Admission medications   Medication Sig Start Date End Date Taking? Authorizing Provider  acetaminophen (TYLENOL) 500 MG tablet Take 1,000 mg by mouth every 6 (six) hours as needed for moderate pain.    Yes [provider]  fentaNYL (DURAGESIC - DOSED MCG/HR) 100 MCG/HR Place 1 patch (100 mcg total) onto the skin every 3 (three) days. 03/22/18  Yes Patrecia Pour, Christean Grief, MD  haloperidol (HALDOL) 5 MG tablet Take 1 tablet (5  mg total) by mouth 2 (two) times daily. 03/22/18  Yes Patrecia Pour, Christean Grief, MD  HYDROmorphone (DILAUDID) 4 MG tablet Take 1 tablet (4 mg total) by mouth every 4 (four) hours as needed for moderate pain or severe pain. 03/23/18  Yes Kyung Rudd, MD  ipratropium-albuterol (DUONEB) 0.5-2.5 (3) MG/3ML SOLN Take 3 mLs by nebulization every 4 (four) hours as needed. 11/18/17  Yes Kayleen Memos, DO  levofloxacin (LEVAQUIN) 750 MG tablet Take 1 tablet (750 mg total) by mouth at bedtime for 2 days. 03/22/18 03/24/18 Yes Doreatha Lew, MD    nicotine (NICODERM CQ - DOSED IN MG/24 HOURS) 21 mg/24hr patch Place 21 mg onto the skin daily.   Yes [provider]  polyethylene glycol (MIRALAX / GLYCOLAX) packet [place] 1 PACKET TWICE DAILY Patient taking differently: Take 17 g by mouth daily as needed for mild constipation.  12/08/17  Yes Kyung Rudd, MD  predniSONE (DELTASONE) 10 MG tablet Take 4 tablets for 3 days; Take 3 tablets for 4 days; Take 2 tablets for 3 days; Take 1 tablet for 4 days 03/22/18  Yes Patrecia Pour, Christean Grief, MD  rosuvastatin (CRESTOR) 20 MG tablet Take 20 mg by mouth every evening.    Yes [provider]  senna-docusate (SENOKOT-S) 8.6-50 MG tablet Take 1 tablet by mouth 2 (two) times daily. 11/18/17  Yes Kayleen Memos, DO  hydrOXYzine (ATARAX/VISTARIL) 25 MG tablet Take 1 tablet (25 mg total) by mouth 3 (three) times daily. 03/22/18   Doreatha Lew, MD  metoprolol tartrate (LOPRESSOR) 25 MG tablet Take 1 tablet (25 mg total) by mouth 2 (two) times daily. 03/22/18   Doreatha Lew, MD    Family History Family History  Problem Relation Age of Onset  . Cancer Mother     Social History Social History   Tobacco Use  . Smoking status: Former Smoker    Packs/day: 2.00    Years: 36.00    Pack years: 72.00    Types: Cigarettes    Start date: 10/31/1981    Last attempt to quit: 11/27/2017    Years since quitting: 0.3  . Smokeless tobacco: Never Used  . Tobacco comment: HAS CALLED QUIT 4 FREE ALREADY  Substance Use Topics  . Alcohol use: Never    Frequency: Never  . Drug use: Never     Allergies   Ibuprofen; Morphine and related; and Prednisone   Review of Systems Review of Systems  Constitutional: Negative for chills and fever.  HENT: Negative for facial swelling and sore throat.   Respiratory: Positive for shortness of breath.   Cardiovascular: Negative for chest pain.  Gastrointestinal: Negative for abdominal pain, nausea and vomiting.  Genitourinary: Negative for  dysuria.  Musculoskeletal: Positive for arthralgias and back pain (chronic).  Skin: Negative for rash and wound.  Neurological: Positive for numbness (paresthesia). Negative for headaches.  Psychiatric/Behavioral: The patient is not nervous/anxious.      Physical Exam Updated Vital Signs BP 105/75   Pulse (!) 127   Temp 98.1 F (36.7 C) (Oral)   Resp (!) 26   Ht 5\' 1"  (1.549 m)   Wt 67.5 kg   SpO2 90%   BMI 28.12 kg/m   Physical Exam  Constitutional: She appears well-developed and well-nourished. No distress.  HENT:  Head: Normocephalic and atraumatic.  Mouth/Throat: Oropharynx is clear and moist. No oropharyngeal exudate.  Eyes: Pupils are equal, round, and reactive to light. Conjunctivae are normal. Right eye exhibits no discharge.  Left eye exhibits no discharge. No scleral icterus.  Neck: Normal range of motion. Neck supple. No thyromegaly present.  Cardiovascular: Normal rate, regular rhythm, normal heart sounds and intact distal pulses. Exam reveals no gallop and no friction rub.  No murmur heard. Pulmonary/Chest: Effort normal. No stridor. No respiratory distress. She has wheezes (expiratory on the left). She has no rales.  Abdominal: Soft. Bowel sounds are normal. She exhibits no distension. There is no tenderness. There is no rebound and no guarding.  Musculoskeletal: She exhibits no edema.  No midline cervical, thoracic, or lumbar tenderness No tenderness on palpation to bilateral legs or hips  Lymphadenopathy:    She has no cervical adenopathy.  Neurological: She is alert. Coordination normal.  5/5 strength and normal sensation to bilateral lower extremity  Skin: Skin is warm and dry. No rash noted. She is not diaphoretic. No pallor.  Psychiatric: She has a normal mood and affect.  Nursing note and vitals reviewed.    ED Treatments / Results  Labs (all labs ordered are listed, but only abnormal results are displayed) Labs Reviewed  COMPREHENSIVE METABOLIC  PANEL - Abnormal; Notable for the following components:      Result Value   Potassium 3.4 (*)    Glucose, Bld 104 (*)    Total Protein 6.2 (*)    Albumin 2.9 (*)    ALT 63 (*)    All other components within normal limits  CBC WITH DIFFERENTIAL/PLATELET - Abnormal; Notable for the following components:   RDW 16.8 (*)    Neutro Abs 8.9 (*)    All other components within normal limits  CULTURE, BLOOD (ROUTINE X 2)  CULTURE, BLOOD (ROUTINE X 2)  PROTIME-INR  URINALYSIS, ROUTINE W REFLEX MICROSCOPIC  BRAIN NATRIURETIC PEPTIDE  I-STAT CG4 LACTIC ACID, ED  I-STAT BETA HCG BLOOD, ED ([facility], [facility], [facility] ONLY)    EKG None  Radiology Dg Chest 2 View  Result Date: 03/24/2018 CLINICAL DATA:  Shortness of breath for a few days. History of hypertension. EXAM: CHEST - 2 VIEW COMPARISON:  03/21/2018 and multiple prior studies. FINDINGS: Cardiac silhouette normal in size. No mediastinal masses. Mediastinal adenopathy noted on a recent CT, 03/18/2018, not resolved radiographically. Thickened bronchovascular markings bilaterally. There is hazy ground-glass opacity in the right lung with more confluent opacity at the right lung base, similar to the appearance on the prior chest radiograph and on the prior CT. No new lung abnormalities. No convincing pleural effusion.  No pneumothorax. Right PICC tip projects in the right atrium. IMPRESSION: 1. No significant change from the most recent prior exam allowing for differences in technique and patient positioning. 2. Persistent consolidation in the right lower lobe, which may be pneumonia, postobstructive or a combination of both. Electronically Signed   By: Lajean Manes M.D.   On: 03/24/2018 13:30   Dg Femur Min 2 Views Right  Result Date: 03/24/2018 CLINICAL DATA:  Right thigh pain for 2-3 days. EXAM: RIGHT FEMUR 2 VIEWS COMPARISON:  None. FINDINGS: No fracture.  No bone lesion. Hip joint normally spaced and aligned. Knee joint is normally aligned. Mild narrowing of  the knee joint space. No convincing joint effusion. Soft tissues are unremarkable. IMPRESSION: 1. No fracture or acute finding. No bone lesion. No soft tissue abnormality. Electronically Signed   By: Lajean Manes M.D.   On: 03/24/2018 13:32    Procedures Procedures (including critical care time)  Medications Ordered in ED Medications  HYDROmorphone (DILAUDID) injection 1 mg (has no administration  in time range)  HYDROmorphone (DILAUDID) injection 1 mg (has no administration in time range)  potassium chloride SA (K-DUR,KLOR-CON) CR tablet 40 mEq (has no administration in time range)     Initial Impression / Assessment and Plan / ED Course  I have reviewed the triage vital signs and the nursing notes.  Pertinent labs & imaging results that were available during my care of the patient were reviewed by me and considered in my medical decision making (see chart for details).     Patient with increased oxygen requirement, tachypnea, tachycardia and new right leg pain.  X-ray is negative for any bony abnormality, however concern for new metastasis as patient is unable to bear weight on the leg.  Labs are stable for the patient.  Chest x-ray shows persistent pneumonia, postobstructive, or combination of both in the right lower lobe. Pneumonia is being treated outpatient with Levaquin.  I spoke with Dr. Lindi Adie with oncology who will follow the patient.  I spoke with Dr. Cruzita Lederer with [facility] who was admit patient for further management.  I appreciate the above consultants for their assistance with the patient.  Patient also evaluated by Dr. Roderic Palau who guided the patient's management and agrees with plan.  Final Clinical Impressions(s) / ED Diagnoses   Final diagnoses:  Cancer associated pain  Right leg pain  Shortness of breath    ED Discharge Orders    None       Frederica Kuster, PA-C 03/24/18 1451    Milton Ferguson, MD 03/24/18 1556

## 2018-03-24 NOTE — Progress Notes (Signed)
Patient arrived on unit via stretcher from ED.  No family at bedside.  Telemetry placed per MD order and CMT notified.  

## 2018-03-24 NOTE — ED Notes (Signed)
Report was given to [facility]. Concerns over Telemetry order and admission to medsurg. Clarifying with hospitalist.

## 2018-03-24 NOTE — ED Notes (Signed)
Patient transported to X-ray 

## 2018-03-25 DIAGNOSIS — M79604 Pain in right leg: Secondary | ICD-10-CM

## 2018-03-25 DIAGNOSIS — R52 Pain, unspecified: Secondary | ICD-10-CM | POA: Diagnosis not present

## 2018-03-25 DIAGNOSIS — R0602 Shortness of breath: Secondary | ICD-10-CM | POA: Diagnosis not present

## 2018-03-25 DIAGNOSIS — G893 Neoplasm related pain (acute) (chronic): Secondary | ICD-10-CM | POA: Diagnosis not present

## 2018-03-25 DIAGNOSIS — C3431 Malignant neoplasm of lower lobe, right bronchus or lung: Secondary | ICD-10-CM | POA: Diagnosis not present

## 2018-03-25 LAB — BASIC METABOLIC PANEL
ANION GAP: 9 (ref 5–15)
BUN: 7 mg/dL (ref 6–20)
CO2: 25 mmol/L (ref 22–32)
Calcium: 9.5 mg/dL (ref 8.9–10.3)
Chloride: 105 mmol/L (ref 98–111)
Creatinine, Ser: 0.36 mg/dL — ABNORMAL LOW (ref 0.44–1.00)
GFR calc Af Amer: >60 mL/min (ref >60)
GLUCOSE: 96 mg/dL (ref 70–99)
POTASSIUM: 3.9 mmol/L (ref 3.5–5.1)
Sodium: 139 mmol/L (ref 135–145)

## 2018-03-25 LAB — TROPONIN I: Troponin I: <0.03 ng/mL (ref <0.03)

## 2018-03-25 LAB — CBC
HEMATOCRIT: 38.9 % (ref 36.0–46.0)
HEMOGLOBIN: 12 g/dL (ref 12.0–15.0)
MCH: 28 pg (ref 26.0–34.0)
MCHC: 30.8 g/dL (ref 30.0–36.0)
MCV: 90.9 fL (ref 78.0–100.0)
Platelets: 242 10*3/uL (ref 150–400)
RBC: 4.28 MIL/uL (ref 3.87–5.11)
RDW: 17.2 % — ABNORMAL HIGH (ref 11.5–15.5)
WBC: 6.7 10*3/uL (ref 4.0–10.5)

## 2018-03-25 NOTE — Progress Notes (Signed)
Patient brother arrived on unit with patient's clothes.  Patient stated she is leaving. She is not waiting to be seen by PT.  MD notified via text page. AMA form completed and placed in patient's chart.

## 2018-03-25 NOTE — Discharge Summary (Signed)
Discharge Summary  Jill [name] RXV:[id number] DOB: 02-20-1961  PCP: Jill Halim., [name]  Admit date: 03/24/2018 Discharge date: 03/25/2018  Time spent: 25 minutes  Recommendations for Outpatient Follow-up:  1. Patient left AMA  Discharge Diagnoses:  Active Hospital Problems   Diagnosis Date Noted  . Cancer associated pain 03/24/2018  . Intractable pain 03/24/2018    Resolved Hospital Problems  No resolved problems to display.    Vitals:   03/25/18 0054 03/25/18 0509  BP:  96/61  Pulse: 93 89  Resp: 17 18  Temp:  98.6 F (37 C)  SpO2:  97%    History of present illness:  Jill [name] is a 56 y.o. female with medical history significant of stage IV lung cancer currently on Keytruda followed by Dr. Earlie [name], tobacco abuse, hypertension, schizophrenia, with several recent admissions to the hospital most recently discharged 2 days ago with postobstructive pneumonia presents to the hospital with intractable right hip pain and inability to bear any weight on the right side.  Patient was discharged 2 days ago to an SNF, however on arrival there she had a "bad experience", and called her brother who came and picked her up and got her home.  In the last couple of days, she states that her right hip has been excruciating and she is unable to bear any weight on the right side.  Her home oral regimen is not controlling the pain.  She is also been experiencing slight worsening of shortness of breath as well as had an episode of chest pain earlier today.  She denies any fever or chills.  She denies any abdominal pain, nausea or vomiting, she has no diarrhea and is in fact constipated.  Right hip x-ray unremarkable for any acute finding or fracture.  Patient informed and decided to leave AMA.  Did not want to wait for PT assessment.  03/25/2018: Patient seen and examined at bedside.  Reports no acute events overnight.  Has no new complaints this morning.  Informed that her right hip  x-ray was benign.  She made the decision to leave AMA.  Hospital Course:  Active Problems:   Cancer associated pain   Intractable pain  Stage IV lung cancer/cancer associated intractable pain -Possible bone metastasis on the right however x-ray not completely clear.  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 later.  She is on 4 mg of p.o. Dilaudid at home.  Palliative care was consulted last time she was hospitalized to assist in pain control, if the above-mentioned measures will not work may benefit from repeat consultation -Palliative care consulted for pain management. -However patient left AMA  Recent healthcare associated pneumonia -Patient was placed on Levaquin on discharge, has 2 days left and will continue here.  She has no leukocytosis and is not febrile.  Chest x-ray looks unchanged.  Sinus tachycardia -This is a known problem for the patient, she tells me her heart has been beating a little bit faster since April of this year, suspect in the setting of metastatic cancer, now slightly worsening due to uncontrolled pain -Continue home metoprolol.   Chronic respiratory failure with hypoxia -She appears to be satting well on home 3 L nasal cannula.  Patient with multiple compression fractures in the T-spine likely contributing to poor  inspiratory effort, in addition she is still smoking  Resolved hypokalemia after repletion  Tobacco abuse -Continue nicotine patch  Normocytic anemia -Of chronic illness, no bleeding   Discharge Exam: BP 96/61 (BP Location: Right Arm)   Pulse 89   Temp 98.6 F (37 C) (Oral)   Resp 18   Ht 5\' 1"  (1.549 m)   Wt 67.5 kg   SpO2 97%   BMI 28.12 kg/m  . General: 57 y.o. year-old female well developed well nourished in no  acute distress.  Alert and interactive. . Cardiovascular: Regular rate and rhythm with no rubs or gallops.  No thyromegaly or JVD noted.  Marland Kitchen Respiratory: Mild rales at bases.  No wheezing.  Good inspiratory effort. . Abdomen: Soft nontender nondistended with normal bowel sounds x4 quadrants. . Musculoskeletal: No lower extremity edema. 2/4 pulses in all 4 extremities. Marland Kitchen Psychiatry: Mood is appropriate for condition and setting  Discharge Instructions You were cared for by a hospitalist during your hospital stay. If you have any questions about your discharge medications or the care you received while you were in the hospital after you are discharged, you can call the unit and asked to speak with the hospitalist on call if the hospitalist that took care of you is not available. Once you are discharged, your primary care physician will handle any further medical issues. Please note that NO REFILLS for any discharge medications will be authorized once you are discharged, as it is imperative that you return to your primary care physician (or establish a relationship with a primary care physician if you do not have one) for your aftercare needs so that they can reassess your need for medications and monitor your lab values.   Allergies as of 03/25/2018      Reactions   Ibuprofen Nausea And Vomiting   Morphine And Related    Headaches, Vomiting   Prednisone    Dizziness and pain      Medication List    STOP taking these medications   levofloxacin 750 MG tablet Commonly known as:  LEVAQUIN   predniSONE 10 MG tablet Commonly known as:  DELTASONE     TAKE these medications   acetaminophen 500 MG tablet Commonly known as:  TYLENOL Take 1,000 mg by mouth every 6 (six) hours as needed for moderate pain.   fentaNYL 100 MCG/HR Commonly known as:  DURAGESIC - dosed mcg/hr Place 1 patch (100 mcg total) onto the skin every 3 (three) days.   haloperidol 5 MG tablet Commonly known as:   HALDOL Take 1 tablet (5 mg total) by mouth 2 (two) times daily.   HYDROmorphone 4 MG tablet Commonly known as:  DILAUDID Take 1 tablet (4 mg total) by mouth every 4 (four) hours as needed for moderate pain or severe pain.   hydrOXYzine 25 MG tablet Commonly known as:  ATARAX/VISTARIL Take 1 tablet (25 mg total) by mouth 3 (three) times daily.   ipratropium-albuterol 0.5-2.5 (3) MG/3ML Soln Commonly known as:  DUONEB Take 3 mLs by nebulization every 4 (four) hours as needed.   metoprolol tartrate 25 MG tablet Commonly known as:  LOPRESSOR Take 1 tablet (25 mg total) by mouth 2 (two) times daily.   nicotine 21 mg/24hr patch Commonly known as:  NICODERM CQ - dosed in mg/24 hours Place 21 mg onto the skin daily.   polyethylene glycol packet Commonly known as:  MIRALAX / Outlook 1 PACKET TWICE DAILY What changed:  See the new  instructions.   rosuvastatin 20 MG tablet Commonly known as:  CRESTOR Take 20 mg by mouth every evening.   senna-docusate 8.6-50 MG tablet Commonly known as:  Senokot-S Take 1 tablet by mouth 2 (two) times daily.      Allergies  Allergen Reactions  . Ibuprofen Nausea And Vomiting  . Morphine And Related     Headaches, Vomiting  . Prednisone     Dizziness and pain      The results of significant diagnostics from this hospitalization (including imaging, microbiology, ancillary and laboratory) are listed below for reference.    Significant Diagnostic Studies: Dg Chest 2 View  Result Date: 03/24/2018 CLINICAL DATA:  Shortness of breath for a few days. History of hypertension. EXAM: CHEST - 2 VIEW COMPARISON:  03/21/2018 and multiple prior studies. FINDINGS: Cardiac silhouette normal in size. No mediastinal masses. Mediastinal adenopathy noted on a recent CT, 03/18/2018, not resolved radiographically. Thickened bronchovascular markings bilaterally. There is hazy ground-glass opacity in the right lung with more confluent opacity at the  right lung base, similar to the appearance on the prior chest radiograph and on the prior CT. No new lung abnormalities. No convincing pleural effusion.  No pneumothorax. Right PICC tip projects in the right atrium. IMPRESSION: 1. No significant change from the most recent prior exam allowing for differences in technique and patient positioning. 2. Persistent consolidation in the right lower lobe, which may be pneumonia, postobstructive or a combination of both. Electronically Signed   By: Lajean Manes M.D.   On: 03/24/2018 13:30   Dg Chest 2 View  Result Date: 03/15/2018 CLINICAL DATA:  Cough. EXAM: CHEST - 2 VIEW COMPARISON:  Radiographs and CT 01/06/2018 FINDINGS: Tip of the right central line in the mid SVC. Progressive interstitial opacities from prior exam suspicious for pulmonary edema. Mild volume loss in the right lung. Right lower lobe mass on prior CT is not well seen radiographically. Unchanged heart size and mediastinal contours. No pneumothorax or large pleural effusion. IMPRESSION: 1. Diffuse interstitial opacities suspicious for pulmonary edema. 2. Right lung volume loss. Right lower lobe mass on prior CT not well seen radiographically. 3. Tip of the right upper extremity PICC in the SVC. Electronically Signed   By: Keith Rake M.D.   On: 03/15/2018 07:41   Ct Angio Chest Pe W Or Wo Contrast  Result Date: 03/18/2018 CLINICAL DATA:  Shortness of breath and weakness for several months EXAM: CT ANGIOGRAPHY CHEST WITH CONTRAST TECHNIQUE: Multidetector CT imaging of the chest was performed using the standard protocol during bolus administration of intravenous contrast. Multiplanar CT image reconstructions and MIPs were obtained to evaluate the vascular anatomy. CONTRAST:  172mL ISOVUE-370 IOPAMIDOL (ISOVUE-370) INJECTION 76% COMPARISON:  Chest x-ray from 03/15/2018, CT from 01/07/2018 FINDINGS: Cardiovascular: Thoracic aorta demonstrates atherosclerotic calcifications. Mild aneurysmal  dilatation of the ascending aorta 4 cm is noted. No dissection is seen. No cardiac enlargement is noted. No pericardial effusion is seen. Coronary calcifications are noted. The pulmonary artery demonstrates a normal branching pattern. No filling defects are identified to suggest pulmonary emboli. Right-sided PICC line is noted. Mediastinum/Nodes: Thoracic inlet is within normal limits. Scattered small mediastinal lymph nodes are noted relatively similar to that seen on the prior exam. None of these are significant by size criteria. Some right hilar adenopathy is again identified similar to that seen on recent CT examination. The right infrahilar mass lesion is not as well appreciated on today's exam. It demonstrates significant reduction in bulk when compare with the  prior exam. Measurement is somewhat difficult due to adjacent lymph nodes. The esophagus is within normal limits. Lungs/Pleura: Emphysematous changes of lungs are noted. The left lung is clear with the exception of mild atelectatic changes. Considerable right lower lobe infiltrate is seen consistent with acute pneumonic infiltrate. The degree of consolidation has improved when compared with the prior CT examination. Right middle lobe nodule is again identified stable from the prior exams. It did not demonstrate hypermetabolic activity on prior PET-CT. Upper Abdomen: Visualized upper abdomen reveals no acute abnormality. Musculoskeletal: Degenerative changes of the thoracic spine are noted. There are changes consistent with prior kyphoplasty at T7. Adjacent compression deformity at T6 is noted which is new from the prior CT examination. Additionally compression deformities at T10 and T11 are noted new from the prior exam. Lytic lesion is noted in the distal aspect of the left clavicle as well as a lytic lesion in the left second rib laterally. These have increased in size most marked on the left rib lesion with the soft tissue component now measuring  approximately 3.3 cm. Expansile lesion is noted in the left seventh rib anteriorly with soft tissue component. Expansile lesion is also noted within the spinous process at T11. Review of the MIP images confirms the above findings. IMPRESSION: No evidence of acute pulmonary emboli. Significant reduction in size of the right infrahilar mass lesion with improvement of previously seen postobstructive consolidation in the right lower lobe. Persistent acute pneumonic infiltrate is noted within the right lower lobe and to a lesser degree in the right upper lobe. Previously treated T7 compression deformity. New compression deformities are noted at T6, T10 and T11. No definitive lytic lesion is identified although pathologic fracture cannot be totally excluded on this exam. Bony metastatic disease which has increased in the interval from the prior exam. Stable right middle lobe nodule which has been previously shown to be non hypermetabolic. Ascending thoracic aortic aneurysm. Recommend annual imaging followup by CTA or MRA. This recommendation follows 2010 ACCF/AHA/AATS/ACR/ASA/SCA/SCAI/SIR/STS/SVM Guidelines for the Diagnosis and Management of Patients with Thoracic Aortic Disease. Circulation. 2010; 121: T016-W109 Aortic Atherosclerosis (ICD10-I70.0) and Emphysema (ICD10-J43.9). Electronically Signed   By: Inez Catalina M.D.   On: 03/18/2018 13:42   Dg Chest Port 1 View  Result Date: 03/21/2018 CLINICAL DATA:  Shortness of breath EXAM: PORTABLE CHEST 1 VIEW COMPARISON:  03/20/2018 FINDINGS: Cardiac shadow is stable in appearance. Right-sided PICC line is again identified and stable. Patient rotation to the right is again seen. Patchy infiltrative changes are noted particularly on the right but to a lesser degree in the left base. These are stable in appearance from the prior exam. No new focal abnormality is seen. IMPRESSION: No change from the previous day. Electronically Signed   By: Inez Catalina M.D.   On:  03/21/2018 10:07   Dg Chest Port 1 View  Result Date: 03/20/2018 CLINICAL DATA:  57 year old female with shortness of breath EXAM: PORTABLE CHEST 1 VIEW COMPARISON:  Prior chest x-ray 03/19/2018 FINDINGS: The patient is rotated to the right. This results in distortion of the cardiac and mediastinal contours. Within this limitation, similar degree of cardiomegaly. A right upper extremity PICC is present. The catheter tip projects over the mid SVC. Extensive patchy airspace opacity again noted in the right upper and right lower lobes. There may be a small associated pleural effusion. Mild atelectasis in the left lung base. Extensive bronchitic changes are similar compared to prior. Surgical changes of prior cement augmentation in the midthoracic  spine. IMPRESSION: 1. Lower inspiratory volumes with increasing bibasilar atelectasis superimposed on multifocal pneumonia in the right lung. 2. Slightly increased pulmonary vascular congestion now bordering on mild interstitial edema. 3. Stable position of right upper extremity PICC. Electronically Signed   By: Jacqulynn Cadet M.D.   On: 03/20/2018 09:13   Dg Chest Port 1 View  Result Date: 03/19/2018 CLINICAL DATA:  Shortness of breath EXAM: PORTABLE CHEST 1 VIEW COMPARISON:  CT yesterday. FINDINGS: Right lower lobe pneumonia without visible cavitation or effusion. No pneumothorax. Atelectatic opacity at the left base. Right upper extremity PICC with tip at the SVC. Left second rib metastasis with subpleural nodule. IMPRESSION: Right lower lobe pneumonia and left lower lobe atelectasis, likely stable from CT yesterday. Electronically Signed   By: Monte Fantasia M.D.   On: 03/19/2018 07:14   Dg Chest Port 1 View  Result Date: 03/15/2018 CLINICAL DATA:  Shortness of Breath EXAM: PORTABLE CHEST 1 VIEW COMPARISON:  March 15, 2018 study obtained earlier in the day. Chest CT January 07, 2018 FINDINGS: Central catheter tip is in the superior vena cava. There is  airspace opacity in the right lower lobe with consolidation in the medial right base, stable. Previous mass in the right perihilar region seen on CT is not well seen by radiography. There is slight underlying interstitial edema. Heart is mildly enlarged with slight pulmonary venous hypertension. No adenopathy is appreciable by radiography. There is an expansile lesion involving the posterior left second rib with soft tissue opacity in the left apex. Patient appears to have had kyphoplasty at T7. Lytic lesion noted in the lateral left clavicle. IMPRESSION: Question pneumonia in the right base with fibrosis, possibly due to a degree of previous radiation therapy. Appearance stable compared to earlier in the day. Pulmonary vascular congestion with mild interstitial edema, stable. Central catheter tip in superior vena cava.  No pneumothorax. Expansile lytic lesion involving the left posterior second rib. A lytic lesion is also noted in the lateral left clavicle. These are lesions felt to be consistent with metastatic foci. Electronically Signed   By: Lowella Grip III M.D.   On: 03/15/2018 08:52   Dg Femur Min 2 Views Left  Result Date: 03/15/2018 CLINICAL DATA:  Known left femur metastasis. EXAM: LEFT FEMUR 2 VIEWS COMPARISON:  None. FINDINGS: 3.3 cm lytic lesion in the proximal femoral metaphysis is compatible with metastatic involvement. No additional femoral lesions evident. No fracture. IMPRESSION: 3.3 cm metastatic lesion proximal left femoral metaphysis. Electronically Signed   By: Misty Stanley M.D.   On: 03/15/2018 20:35   Dg Femur Min 2 Views Right  Result Date: 03/24/2018 CLINICAL DATA:  Right thigh pain for 2-3 days. EXAM: RIGHT FEMUR 2 VIEWS COMPARISON:  None. FINDINGS: No fracture.  No bone lesion. Hip joint normally spaced and aligned. Knee joint is normally aligned. Mild narrowing of the knee joint space. No convincing joint effusion. Soft tissues are unremarkable. IMPRESSION: 1. No fracture  or acute finding. No bone lesion. No soft tissue abnormality. Electronically Signed   By: Lajean Manes M.D.   On: 03/24/2018 13:32   [place] Ekg Site Rite  Result Date: 03/22/2018 If Site Rite image not attached, placement could not be confirmed due to current cardiac rhythm.   Microbiology: No results found for this or any previous visit (from the past 240 hour(s)).   Labs: Basic Metabolic Panel: Recent Labs  Lab 03/19/18 0557 03/20/18 0310 03/20/18 1847 03/21/18 0340 03/22/18 0450 03/24/18 1239 03/24/18 1935 03/25/18 0536  NA 134* 138 137  --  142 142  --  139  K 4.0 3.9 3.7  --  3.3* 3.4* 4.2 3.9  CL 99 101 100  --  105 104  --  105  CO2 26 29 28   --  27 27  --  25  GLUCOSE 103* 146* 182*  --  79 104*  --  96  BUN 5* 6 9  --  10 13  --  7  CREATININE <0.30* 0.30* 0.40*  --  0.35* 0.48  --  0.36*  CALCIUM 9.1 9.5 9.4  --  9.9 9.5  --  9.5  MG 1.7 1.8  --  1.9 1.9  --   --   --   PHOS 3.3 3.1  --  3.5  --   --   --   --    Liver Function Tests: Recent Labs  Lab 03/19/18 0557 03/20/18 0310 03/20/18 1847 03/24/18 1239  AST 21 59* 99* 16  ALT 20 48* 93* 63*  ALKPHOS 120 132* 123 122  BILITOT 0.7 0.6 0.3 1.0  PROT 5.6* 5.9* 5.6* 6.2*  ALBUMIN 2.3* 2.4* 2.3* 2.9*   No results for input(s): LIPASE, AMYLASE in the last 168 hours. No results for input(s): AMMONIA in the last 168 hours. CBC: Recent Labs  Lab 03/19/18 0557 03/20/18 0310 03/21/18 0340 03/22/18 0450 03/24/18 1239 03/25/18 0536  WBC 7.3 4.7 7.5 9.4 10.3 6.7  NEUTROABS 6.3 4.5 6.9 7.5 8.9*  --   HGB 10.8* 10.4* 10.1* 11.7* 12.9 12.0  HCT 33.7* 32.3* 31.5* 36.9 39.9 38.9  MCV 91.6 90.2 90.3 90.4 89.5 90.9  PLT 247 269 289 300 264 242   Cardiac Enzymes: Recent Labs  Lab 03/24/18 1821 03/25/18 0029 03/25/18 0536  TROPONINI <0.03 <0.03 <0.03   BNP: BNP (last 3 results) Recent Labs    01/06/18 2318 03/24/18 1821  BNP 19.3 67.5    ProBNP (last 3 results) No results for input(s): PROBNP in  the last 8760 hours.  CBG: No results for input(s): GLUCAP in the last 168 hours.     Signed:  Kayleen Memos, MD [facility] 03/25/2018, 9:43 AM

## 2018-03-26 ENCOUNTER — Telehealth: Payer: Self-pay | Admitting: *Deleted

## 2018-03-26 ENCOUNTER — Ambulatory Visit: Payer: Medicare Other

## 2018-03-26 ENCOUNTER — Telehealth: Payer: Self-pay | Admitting: Radiation Therapy

## 2018-03-26 ENCOUNTER — Telehealth: Payer: Self-pay

## 2018-03-26 NOTE — Telephone Encounter (Signed)
Jill [name] called to inform us of her weekend trip to the ED with hospital admission.   She called 911 due to severe pain in the RT hip and inability to bear weight. After being admitted and treated for pain, she did not like the way she was being treated, so she called her brother to come and pick her up, leaving AMA.   Now that she is back home, she is in a great deal of pain, unable to bear weight on her RT leg, and no longer able to walk up and down her front steps. She does not feel that she will be able to make it in for future appointments. Jill [name] has asked for a hospice consult to come and help her ASAP.   I placed a call to Wadie Lessen, palliative care NP, requesting her assistance in getting Hospice activated for [facility].    Mont Dutton R.T.(R)(T) Special Procedures Navigator

## 2018-03-26 NOTE — Telephone Encounter (Signed)
Received voicemail from [place] regarding her pain in her right leg.  Called her back and did not get an answer nor was I able to leave a voicemail.  Will continue to follow as necessary.  Gloriajean Dell. Leonie Green, BSN

## 2018-03-26 NOTE — Telephone Encounter (Signed)
Palliative Medicine RN Note: Rec'd call for our NP Wadie Lessen, who is not working in [place] this week. I attempted to call Ms Omura back to see how I could help, but she did not answer. Left voicemail.  Marjie Skiff Chasty Randal, RN, BSN, [facility] Palliative Medicine Team 03/26/2018 11:17 AM Office [phone]

## 2018-03-27 ENCOUNTER — Ambulatory Visit: Payer: Medicare Other

## 2018-03-28 ENCOUNTER — Ambulatory Visit: Payer: Medicare Other

## 2018-03-28 ENCOUNTER — Telehealth: Payer: Self-pay | Admitting: *Deleted

## 2018-03-28 NOTE — Telephone Encounter (Signed)
Ca from [place] at [facility] to notify MD pt has been admitted to Hospice. Message to scheduling

## 2018-03-29 ENCOUNTER — Ambulatory Visit: Payer: Medicare Other

## 2018-03-29 LAB — CULTURE, BLOOD (ROUTINE X 2)
Culture: NO GROWTH
Culture: NO GROWTH
SPECIAL REQUESTS: ADEQUATE
Special Requests: ADEQUATE

## 2018-04-05 ENCOUNTER — Ambulatory Visit: Payer: Medicare Other | Admitting: Internal Medicine

## 2018-04-05 ENCOUNTER — Ambulatory Visit: Payer: Medicare Other

## 2018-04-05 ENCOUNTER — Other Ambulatory Visit: Payer: Medicare Other

## 2018-04-26 ENCOUNTER — Ambulatory Visit: Payer: Medicare Other

## 2018-04-26 ENCOUNTER — Other Ambulatory Visit: Payer: Medicare Other

## 2018-04-26 ENCOUNTER — Ambulatory Visit: Payer: Medicare Other | Admitting: Internal Medicine

## 2018-04-27 ENCOUNTER — Encounter: Payer: Self-pay | Admitting: Radiation Therapy

## 2018-05-04 ENCOUNTER — Encounter: Payer: Self-pay | Admitting: Radiation Oncology

## 2018-05-04 NOTE — Progress Notes (Signed)
  Radiation Oncology         [phone] ________________________________  Name: Jill [name] MRN: [id number]  Date: 05/04/2018  DOB: May 29, 1961  End of Treatment Note  Diagnosis:   Bone metastases     Indication for treatment::  palliative       Radiation treatment date:   03/21/2018  Site/dose:   Left hip received 8.00 Gy in 1 fractions using 15x photons with a Complex Isodose technique.  Narrative: The patient tolerated radiation treatment relatively well.     Plan: The patient completed radiation treatment and has since passed away.  ________________________________  [name], M.D., Ph.D.   This document serves as a record of services personally performed by Kyung Rudd, MD. It was created on his behalf by Wilburn Mylar, a trained medical scribe. The creation of this record is based on the scribe's personal observations and the provider's statements to them. This document has been checked and approved by the attending provider.

## 2018-05-04 DEATH — deceased

## 2018-05-10 NOTE — Progress Notes (Signed)
  Radiation Oncology         [phone] ________________________________  Name: Jill [name] MRN: [id number]  Date: 03/16/2018  DOB: [birth year]  End of Treatment Note  Diagnosis:   57 y.o. 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  Beams/energy:   Isodose Plan / 10X Photon  Narrative: The patient tolerated radiation treatment relatively well.   No signs of acute toxicity.  Plan: The patient has completed radiation treatment. The patient will return to radiation oncology clinic for routine followup in one month. I advised them to call or return sooner if they have any questions or concerns related to their recovery or treatment.  ------------------------------------------------  [name], MD, PhD  This document serves as a record of services personally performed by Kyung Rudd, MD. It was created on his behalf by Rae Lips, a trained medical scribe. The creation of this record is based on the scribe's personal observations and the provider's statements to them. This document has been checked and approved by the attending provider.

## 2018-05-26 NOTE — Progress Notes (Signed)
  Radiation Oncology         [phone] ________________________________  Name: Jill [name] MRN: [id number]  Date: 03/20/2018  DOB: 07-18-1960  SIMULATION AND TREATMENT PLANNING NOTE  DIAGNOSIS:     ICD-10-CM   1. Bone metastasis ([place]) C79.51      Site: Left hip  NARRATIVE:  The patient was brought to the [place].  Identity was confirmed.  All relevant records and images related to the planned course of therapy were reviewed.   Written consent to proceed with treatment was confirmed which was freely given after reviewing the details related to the planned course of therapy had been reviewed with the patient.  Then, the patient was set-up in a stable reproducible  supine position for radiation therapy.  CT images were obtained.  Surface markings were placed.    Medically necessary complex treatment device(s) for immobilization: Customized Vac-Lok bag.   The CT images were loaded into the planning software.  Then the target and avoidance structures were contoured.  Treatment planning then occurred.  The radiation prescription was entered and confirmed.  A total of 2 complex treatment devices were fabricated which relate to the designed radiation treatment fields. Each of these customized fields/ complex treatment devices will be used on a daily basis during the radiation course. I have requested : Isodose Plan.   PLAN:  The patient will receive 8 Gy in 1 fractions.  ________________________________   [name], MD, PhD

## 2018-07-18 IMAGING — MR MR LUMBAR SPINE WO/W CM
4 of 6 series · 21 of 48 positions shown · IV contrast (multihance)
Comparison: PET-CT 11/08/2017

CLINICAL DATA: Metastatic lung cancer.  SRS targeting of L5 lesion.

EXAM:
MRI LUMBAR SPINE WITHOUT AND WITH CONTRAST
TECHNIQUE: Multiplanar and multiecho pulse sequences of the lumbar spine were
obtained without and with intravenous contrast.
CONTRAST:  15mL MULTIHANCE GADOBENATE DIMEGLUMINE 529 MG/ML IV SOLN

[Series 7: T1 · sagittal · 4.0mm · 0.81mm/px · 3 of 15 slices shown (1 of 2)]
[im 1/15]
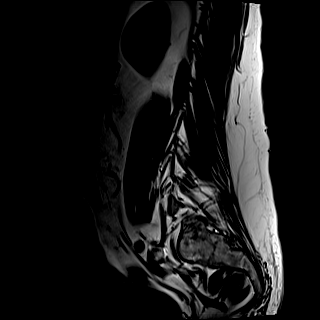
[im 8/15]
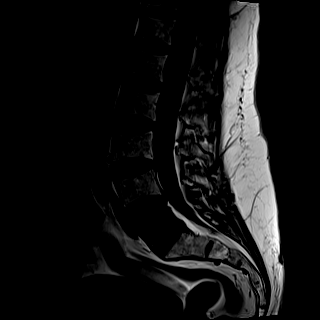
[im 15/15]
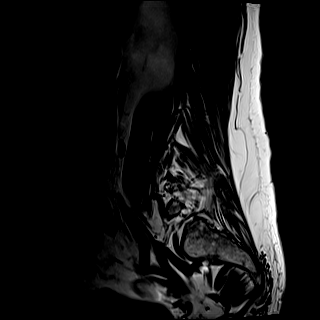

[Series 8: T1 · axial · 2.0mm · 0.39mm/px · z∈[-72,+26]mm · 6 of 60 slices shown (2 of 2)]
[im 1/60]
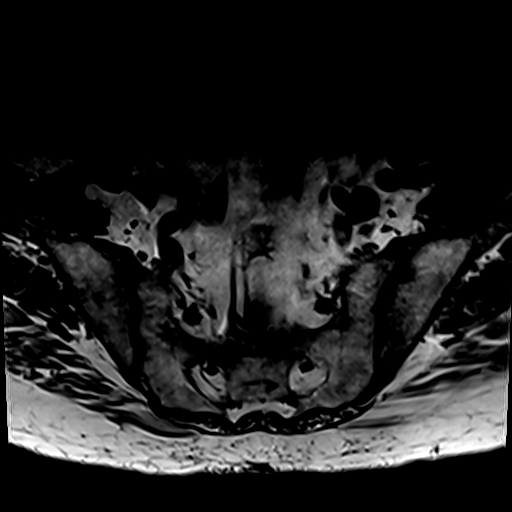
[im 10/60]
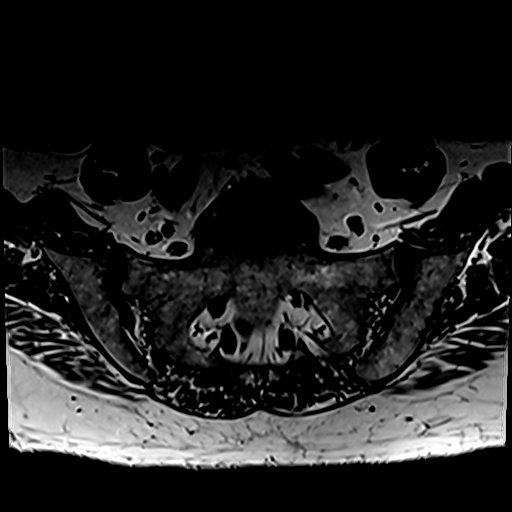
[im 20/60]
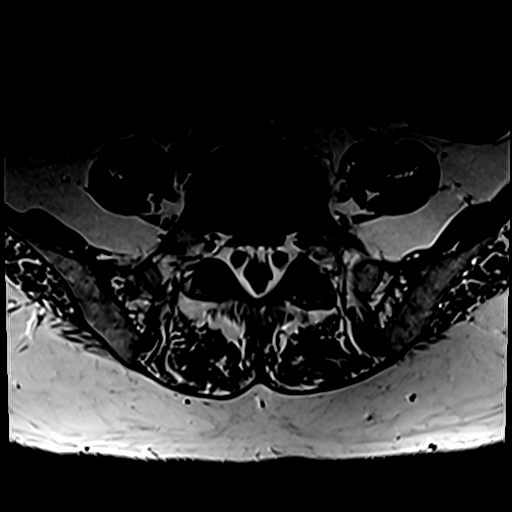
[im 25/60]
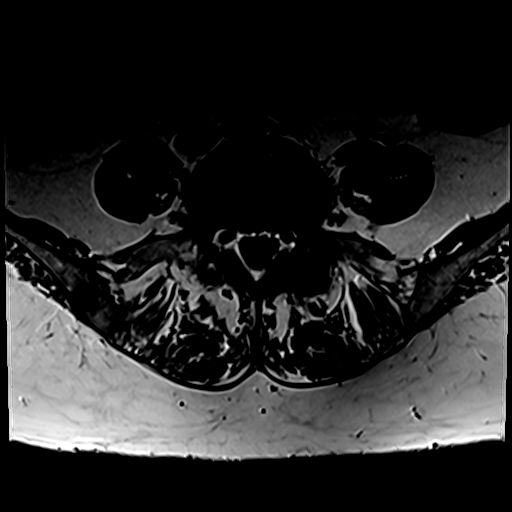
[im 30/60]
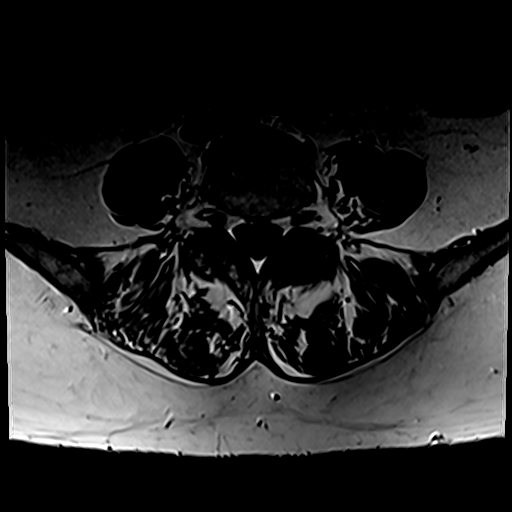
[im 50/60]
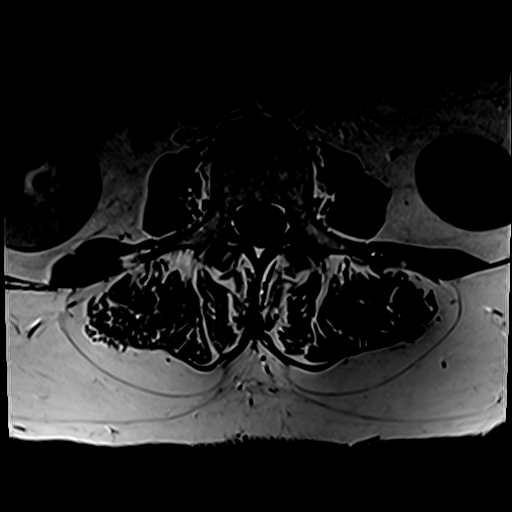

[Series 9: T2 · axial · 2.0mm · 0.39mm/px · z∈[-72,+46]mm · 9 of 60 slices shown (1 of 2)]
[im 1/60]
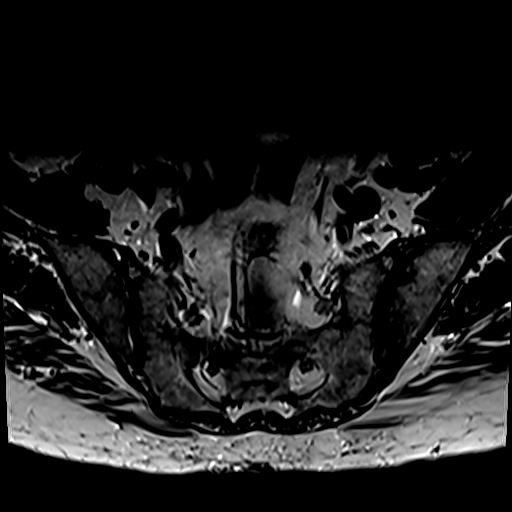
[im 10/60]
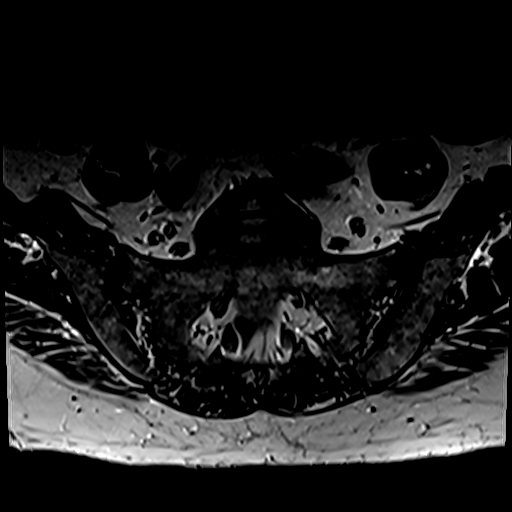
[im 20/60]
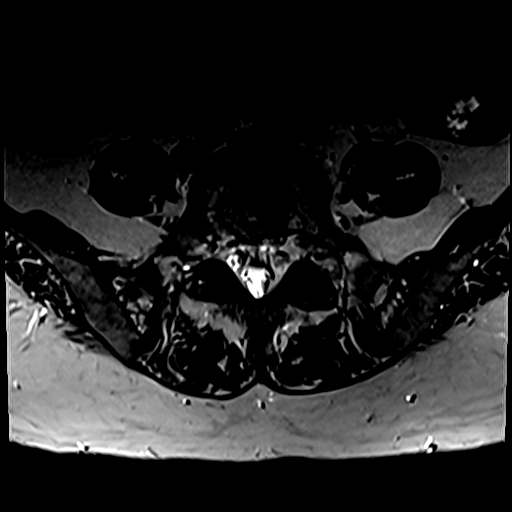
[im 25/60]
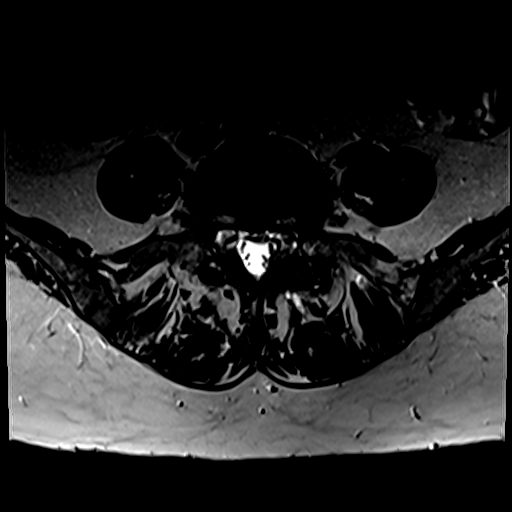
[im 30/60]
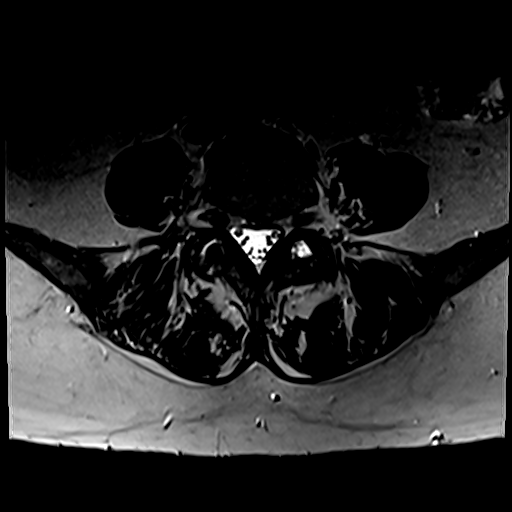
[im 35/60]
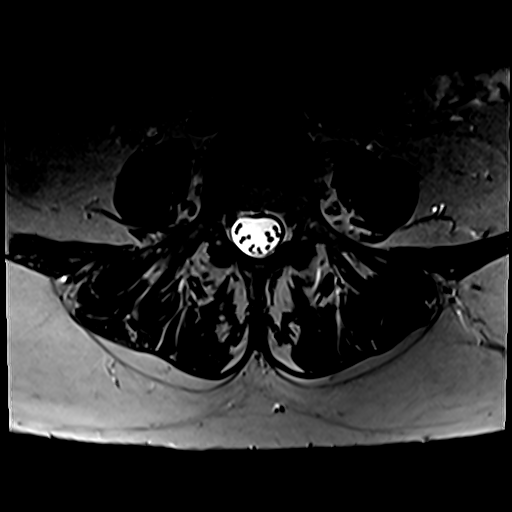
[im 40/60]
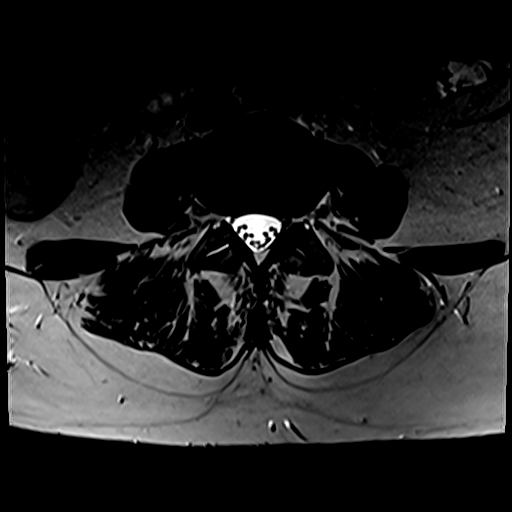
[im 50/60]
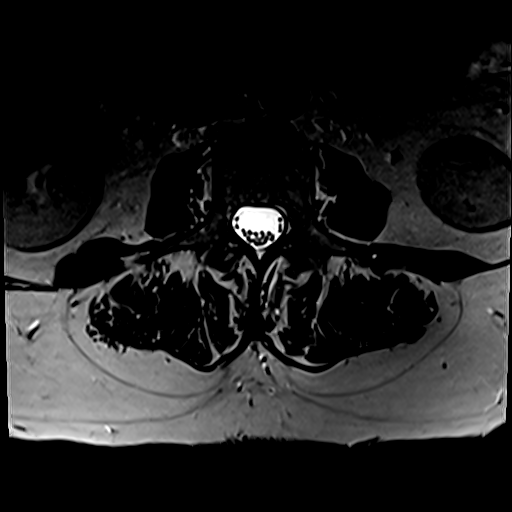
[im 60/60]
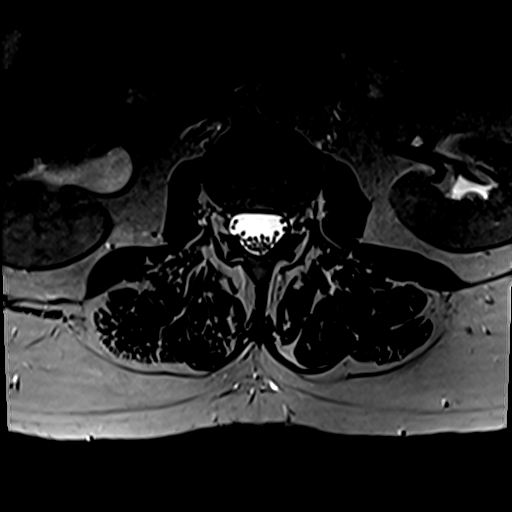

[Series 10: T2 · sagittal · 4.0mm · 0.68mm/px · 3 of 15 slices shown (2 of 2)]
[im 1/15]
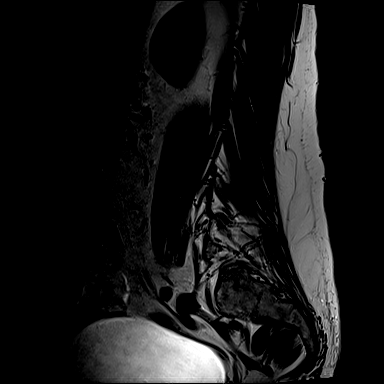
[im 8/15]
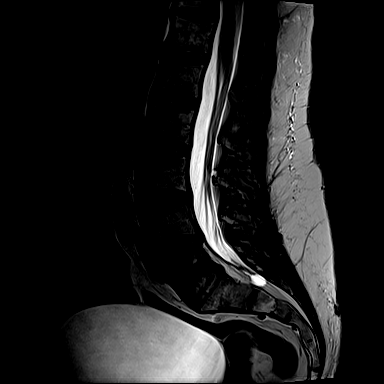
[im 15/15]
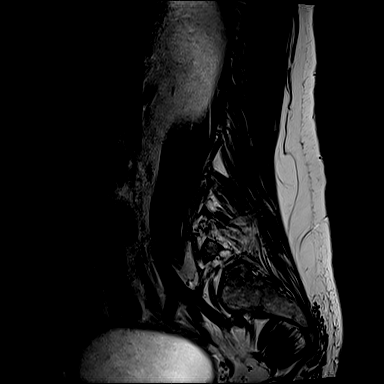

[21 of 48 positions shown; findings below may reference images not displayed]

FINDINGS: Segmentation:  Standard.

Alignment:  Normal.

Vertebrae: There is extensive marrow edema throughout the L5
vertebral body with an underlying enhancing lesion measuring
approximately 2.2 cm corresponding to the hypermetabolic lesion on
prior PET-CT. There is a pathologic L5 compression fracture with
mild vertebral body height loss and a superimposed superior endplate
Schmorl's node deformity. There is disruption of the posterior L5
vertebral body cortex with minimal epidural tumor not resulting in
neural impingement.

There is marrow edema in the T11 spinous process and surrounding
soft tissues with a nondisplaced fracture evident, not imaged
axially. Abnormal uptake was present in this location on the prior
PET-CT, and this is indeterminate for a benign versus pathologic
fracture as no discrete underlying bone lesion is identified on MRI.
There is bilateral L4-5 facet edema.

Conus medullaris and cauda equina: Conus extends to the L1 level.
Conus and cauda equina appear normal.

Paraspinal and other soft tissues: Unremarkable.

Disc levels:

L1-2 and L2-3: Negative.

L3-4: Mild disc bulging and mild facet arthrosis without stenosis.

L4-5: Disc desiccation. Mild disc bulging, mild ligamentum flavum
thickening, and advanced facet arthrosis result in mild bilateral
lateral recess stenosis without significant spinal or neural
foraminal stenosis. There are left larger than right facet joint
effusions.

L5-S1: Mild facet arthrosis without disc herniation or stenosis.
IMPRESSION: 1. L5 vertebral body metastasis with pathologic fracture and mild
vertebral body height loss. Trace epidural tumor without stenosis.
2. T11 spinous process fracture.
